# Patient Record
Sex: Male | Born: 1985 | Race: White | Hispanic: No | Marital: Married | Smoking: Never smoker
Health system: Southern US, Community
[De-identification: ages and names within clinical notes are randomized; demographics above are authoritative.]

## PROBLEM LIST (undated history)

## (undated) DIAGNOSIS — K760 Fatty (change of) liver, not elsewhere classified: Secondary | ICD-10-CM

## (undated) DIAGNOSIS — Z87442 Personal history of urinary calculi: Secondary | ICD-10-CM

## (undated) DIAGNOSIS — M1990 Unspecified osteoarthritis, unspecified site: Secondary | ICD-10-CM

## (undated) DIAGNOSIS — F419 Anxiety disorder, unspecified: Secondary | ICD-10-CM

## (undated) DIAGNOSIS — K219 Gastro-esophageal reflux disease without esophagitis: Secondary | ICD-10-CM

## (undated) DIAGNOSIS — G4730 Sleep apnea, unspecified: Secondary | ICD-10-CM

## (undated) DIAGNOSIS — Z8739 Personal history of other diseases of the musculoskeletal system and connective tissue: Secondary | ICD-10-CM

## (undated) HISTORY — PX: SHOULDER SURGERY: SHX246

## (undated) HISTORY — PX: OTHER SURGICAL HISTORY: SHX169

## (undated) HISTORY — DX: Anxiety disorder, unspecified: F41.9

## (undated) HISTORY — DX: Gastro-esophageal reflux disease without esophagitis: K21.9

## (undated) HISTORY — PX: LEG SURGERY: SHX1003

---
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
## 2006-03-13 ENCOUNTER — Encounter: Admission: RE | Admit: 2006-03-13 | Discharge: 2006-03-15 | Payer: Self-pay | Admitting: Orthopedic Surgery

## 2010-06-17 ENCOUNTER — Encounter: Admission: RE | Admit: 2010-06-17 | Discharge: 2010-06-17 | Payer: Self-pay | Admitting: Family Medicine

## 2012-01-18 IMAGING — US US ABDOMEN COMPLETE
1 series · 14 of 25 positions shown · non-contrast
Comparison: None.

CLINICAL DATA: Elevated liver function tests, vomiting

COMPLETE ABDOMINAL ULTRASOUND

[Series 1: us abdomen complete · 0.35mm/px · 14 of 79 slices shown]
[im 1/79]
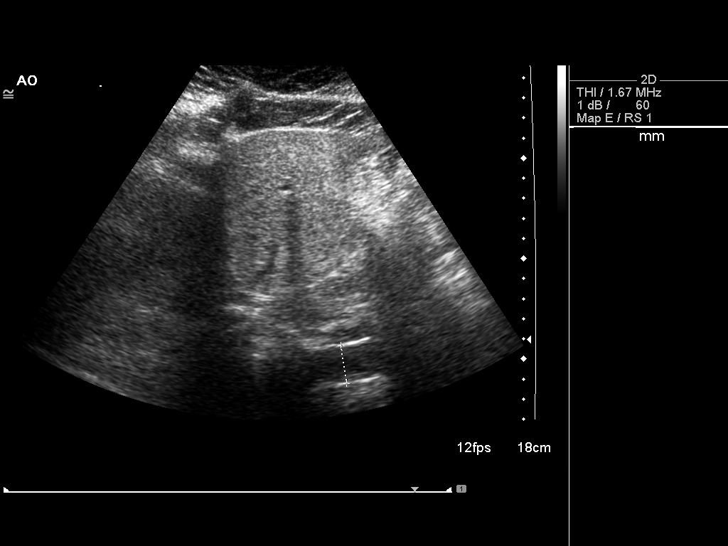
[im 7/79]
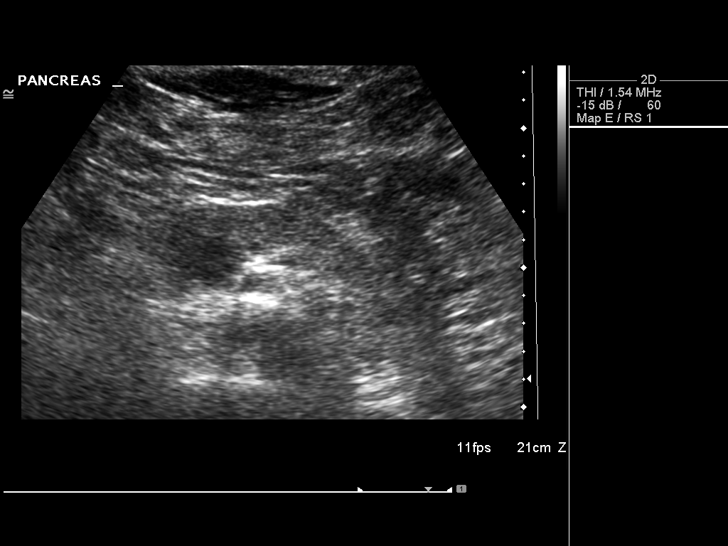
[im 14/79]
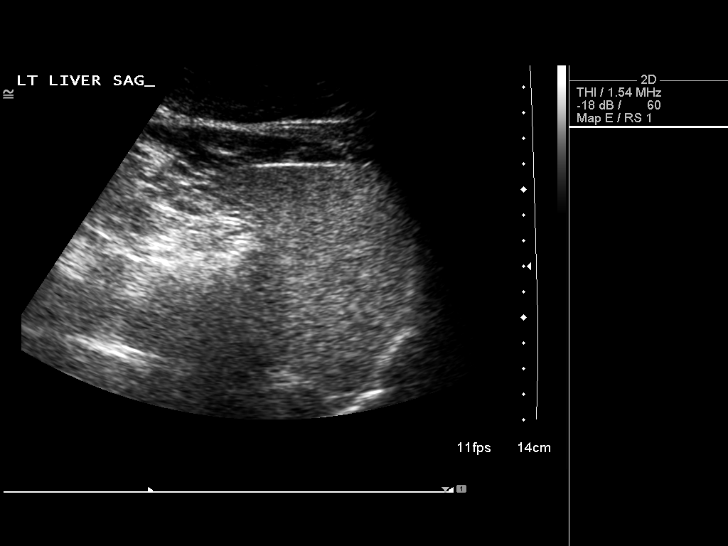
[im 20/79]
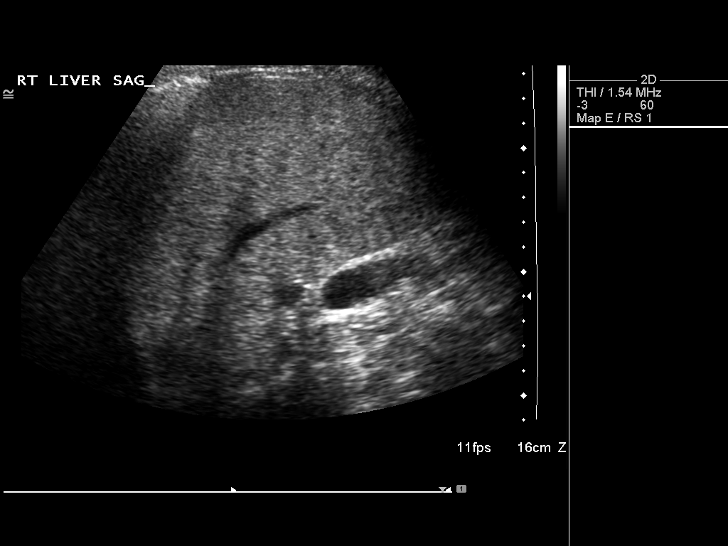
[im 27/79]
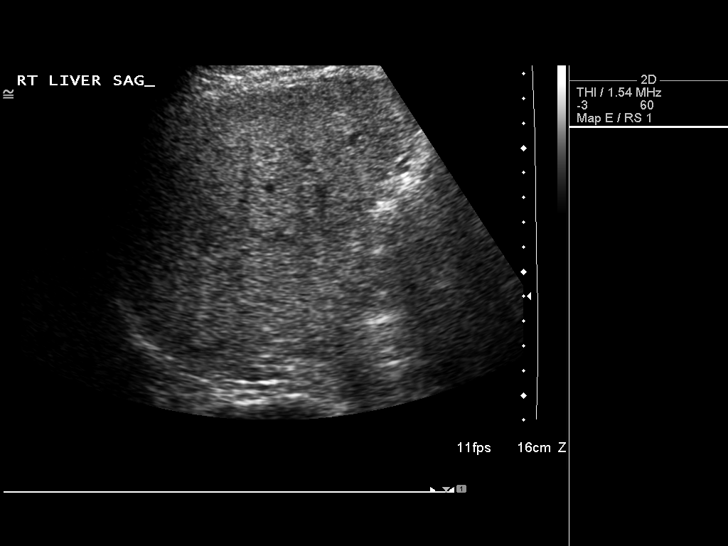
[im 30/79]
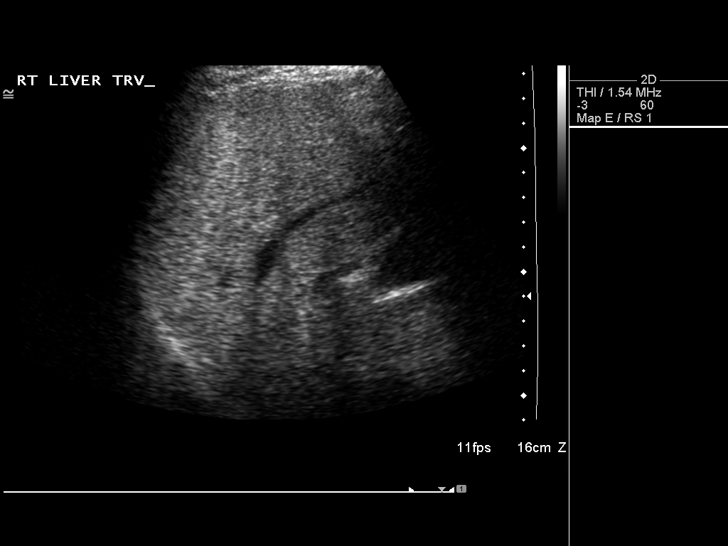
[im 36/79]
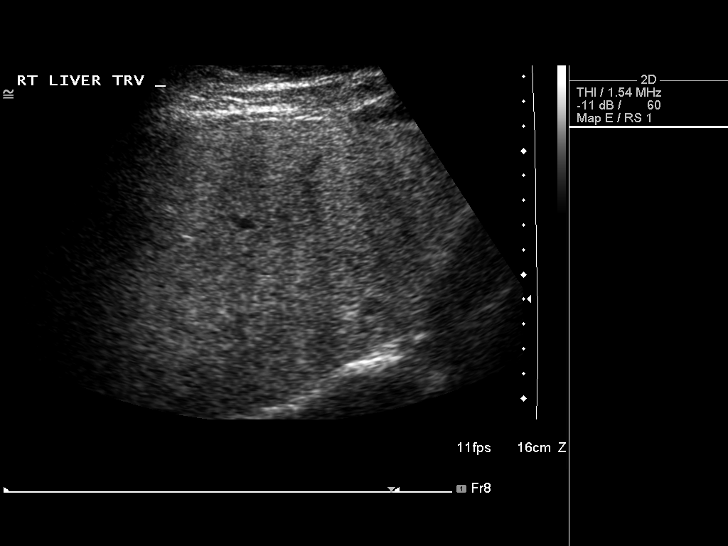
[im 43/79]
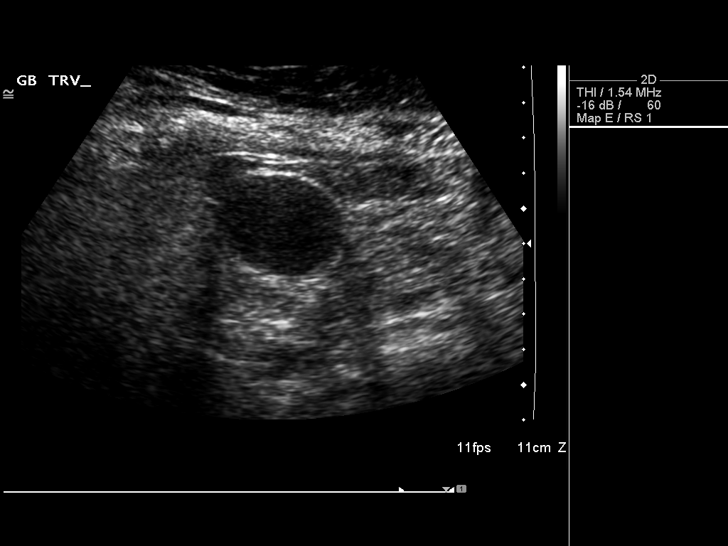
[im 49/79]
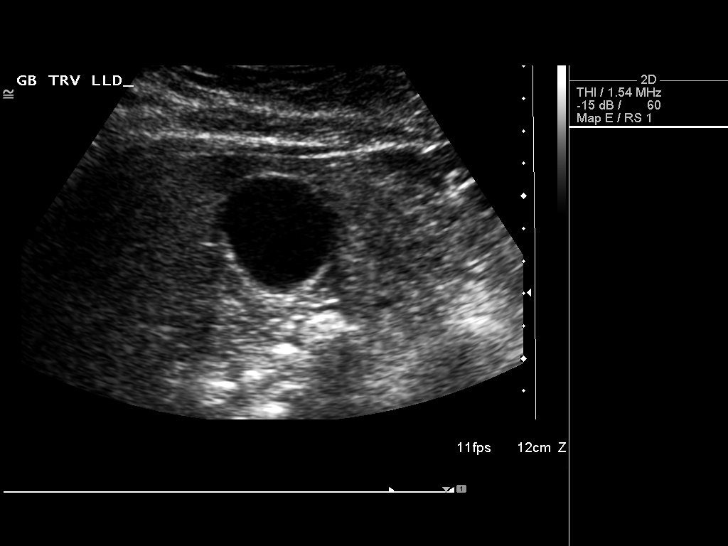
[im 53/79]
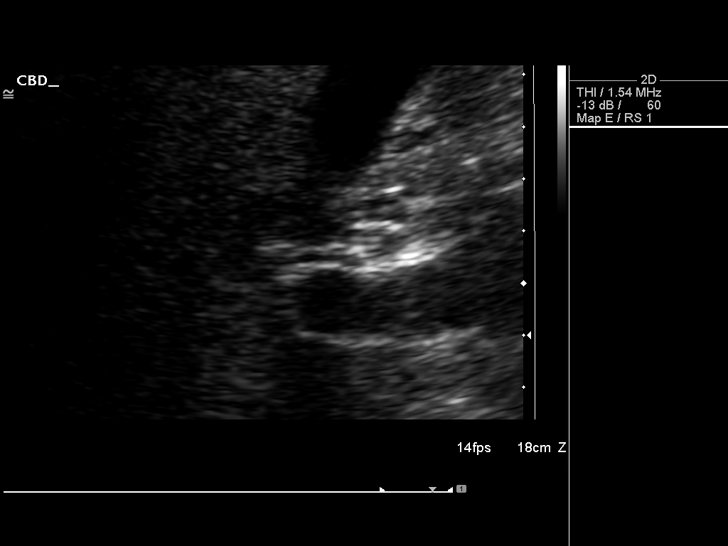
[im 59/79]
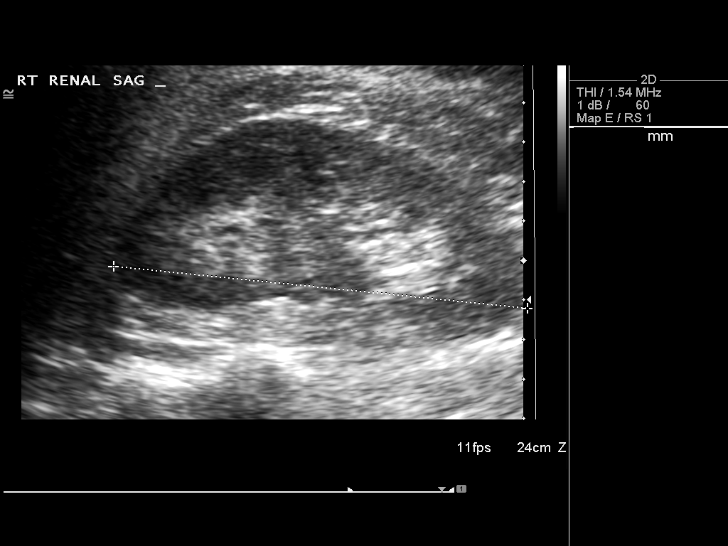
[im 66/79]
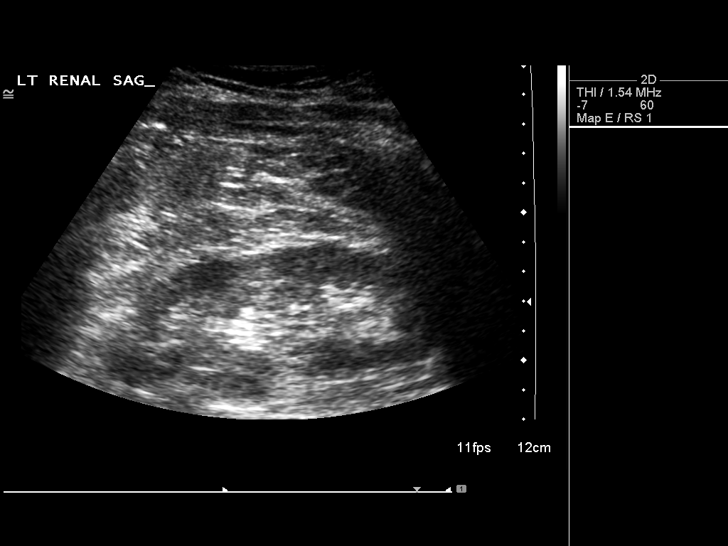
[im 72/79]
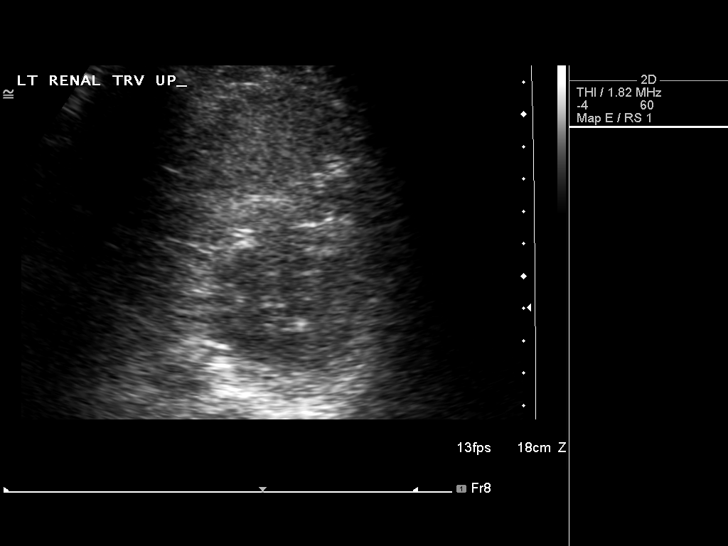
[im 79/79]
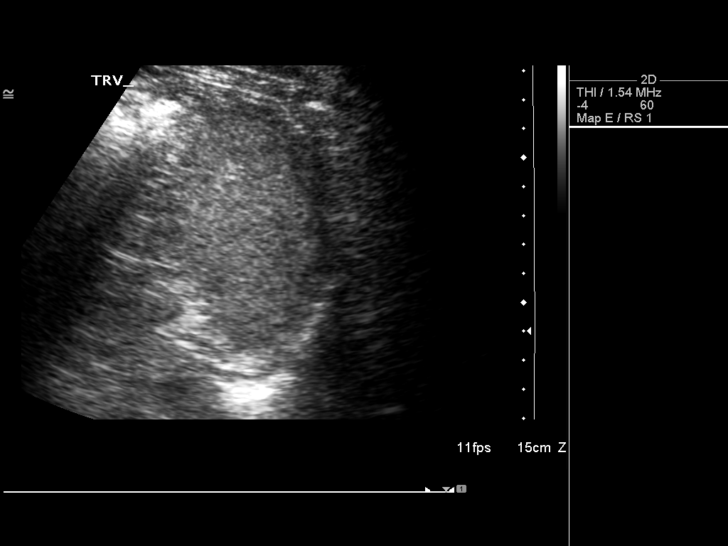

[14 of 25 positions shown; findings below may reference images not displayed]

FINDINGS: Gallbladder:  The gallbladder is visualized and no gallstones are
noted.

Common bile duct:  The common bile duct is normal measuring 2.7 mm
in diameter.

Liver:  The liver is echogenic consistent with fatty infiltration.
No focal abnormality is seen.

IVC:  Appears normal.

Pancreas:  No focal abnormality seen.

Spleen:  The spleen is normal measuring 5.7 cm sagittally.

Right Kidney:  No hydronephrosis is seen.  The right kidney
measures 10.6 cm sagittally.

Left Kidney:  No hydronephrosis.  The left kidney measures 11.6 cm.

Abdominal aorta:  The abdominal aorta is normal in caliber.
IMPRESSION: 1.  No gallstones.  No ductal dilatation.
2.  Fatty infiltration of the liver.

## 2013-01-14 ENCOUNTER — Telehealth: Payer: Self-pay | Admitting: Physician Assistant

## 2013-01-14 NOTE — Telephone Encounter (Signed)
Patient doesn't feel that this is an urgent issue.  Appt scheduled for 01/22/13.  He will let us know if he needs to be seen sooner.

## 2013-01-22 ENCOUNTER — Ambulatory Visit (INDEPENDENT_AMBULATORY_CARE_PROVIDER_SITE_OTHER): Payer: Managed Care, Other (non HMO) | Admitting: General Practice

## 2013-01-22 ENCOUNTER — Encounter: Payer: Self-pay | Admitting: General Practice

## 2013-01-22 VITALS — BP 135/95 | HR 72 | Temp 99.2°F | Ht 70.0 in | Wt 231.5 lb

## 2013-01-22 DIAGNOSIS — F429 Obsessive-compulsive disorder, unspecified: Secondary | ICD-10-CM

## 2013-01-22 MED ORDER — SERTRALINE HCL 50 MG PO TABS: 50.0000 mg | ORAL_TABLET | Freq: Every day | ORAL | Status: DC

## 2013-01-22 NOTE — Patient Instructions (Addendum)
Anxiety and Panic Attacks Your caregiver has informed you that you are having an anxiety or panic attack. There may be many forms of this. Most of the time these attacks come suddenly and without warning. They come at any time of day, including periods of sleep, and at any time of life. They may be strong and unexplained. Although panic attacks are very scary, they are physically harmless. Sometimes the cause of your anxiety is not known. Anxiety is a protective mechanism of the body in its fight or flight mechanism. Most of these perceived danger situations are actually nonphysical situations (such as anxiety over losing a job). CAUSES  The causes of an anxiety or panic attack are many. Panic attacks may occur in otherwise healthy people given a certain set of circumstances. There may be a genetic cause for panic attacks. Some medications may also have anxiety as a side effect. SYMPTOMS  Some of the most common feelings are:  Intense terror.  Dizziness, feeling faint.  Hot and cold flashes.  Fear of going crazy.  Feelings that nothing is real.  Sweating.  Shaking.  Chest pain or a fast heartbeat (palpitations).  Smothering, choking sensations.  Feelings of impending doom and that death is near.  Tingling of extremities, this may be from over-breathing.  Altered reality (derealization).  Being detached from yourself (depersonalization). Several symptoms can be present to make up anxiety or panic attacks. DIAGNOSIS  The evaluation by your caregiver will depend on the type of symptoms you are experiencing. The diagnosis of anxiety or panic attack is made when no physical illness can be determined to be a cause of the symptoms. TREATMENT  Treatment to prevent anxiety and panic attacks may include:  Avoidance of circumstances that cause anxiety.  Reassurance and relaxation.  Regular exercise.  Relaxation therapies, such as yoga.  Psychotherapy with a psychiatrist or  therapist.  Avoidance of caffeine, alcohol and illegal drugs.  Prescribed medication. SEEK IMMEDIATE MEDICAL CARE IF:   You experience panic attack symptoms that are different than your usual symptoms.  You have any worsening or concerning symptoms. Document Released: 09/12/2005 Document Revised: 12/05/2011 Document Reviewed: 01/14/2010 [facility] Patient Information 2013 [place], [place]. Obsessive-Compulsive Disorder Obsessive-compulsive behavior is an anxiety disorder. The patient is constantly troubled by ideas that stay in the mind that cannot be ignored (obsessions). These troubling and sometimes bizarre thoughts compel one to behave in an unreasonable way. The patient will carry out repetitive, ritualistic acts (compulsions) to reduce anxiety. CAUSES The cause of obsessive-compulsive disorder (OCD) is not known. Scientists do know it runs in families. Men begin experiencing it in the teenage years. Women usually begin getting problems (symptoms) in their early 20's. Some studies have shown that the functioning of parts of the brain are different in those with OCD. The disorder may be closely associated with depression. SYMPTOMS  Persons with OCD recognize that their obsessions or compulsions prevent them from living a normal life. They commonly describe their behavior as foolish or pointless. But they cannot change it. Persons with OCD usually feel that their thoughts or obsessions are strange. They do not understand why they are having them. Sometimes the thoughts have to do with a fear that something terrible will happen or that they will do something terrible. Persons with OCD may spend hours each day performing senseless compulsive acts. The amount of time spent is less important than the degree of disruption caused in everyday life. EXAMPLES OF TYPICAL RITUALS  Cleaning. Fearing germs, a person  may shower repeatedly throughout the entire day or wash his or her hands until they  bleed.  Repeating. To dispel anxiety, a person may repeat a name or phrase many times.  Completing. A person may perform a series of complicated steps in an exact order or repeat them until they are done perfectly.  Checking. A person may check things over and over to make sure a task has been completed. For example, repeatedly checking to see if the door is locked or the toaster is unplugged.  Hoarding. A person may constantly collect useless items that he or she repeatedly counts and stacks.  People with OCD may have emotional symptoms associated with depression. This may include guilt, low self-esteem, anxiety, and extreme fatigue. Many of these emotional problems are a result of the frustration brought on by an obsessive-compulsive problem.  Obsessive-compulsive symptoms will often create problems in relationships.  In extreme cases, people with OCD become totally disabled, have no friends, and are unable to leave home surroundings. They spend the day performing rituals or having obsessive thoughts. DIAGNOSIS  There is no laboratory test for OCD. It is diagnosed by your caregiver talking with you and someone close to you about your symptoms. Your caregiver will ask very specific questions about the type of obsessions or compulsions you have.   Your caregiver may diagnose obsessive-compulsive disorder if your obsessions or compulsions:  Cause you distress.  Take more than an hour of your time per day.  Interfere with your normal routine, occupation, social activities, or relationships with others. YOUR CAREGIVER MAY ASK YOU SUCH QUESTIONS AS:  Do you have troubling thoughts that you cannot dispel regardless of how hard you try?  Do you keep things extremely clean and wash your hands more than other people you know?  Do you check things over and over, even though you know that the oven has been turned off or that the front door is locked? TREATMENT  A combination of antidepressant  or anti-anxiety drugs and behavior therapy has been most helpful in treating the disorder. Clomipramine (Anafranil) is often used in the treatment of OCD. Some drugs like Zoloft and Luvox have been used with positive results. In very rare cases, neurosurgery is performed. OCD is not obsession about life's normal worries. Your caregiver will have to make sure that a medication or drug is not adding to your symptoms. Phobias and longstanding (chronic) depression can also occur along with OCD.  HOW LONG WILL THE EFFECTS OF OBSESSIVE-COMPULSIVE DISORDER LAST? Obsessive-compulsive disorder usually appears in the late teens and early twenties. The disorder may last a lifetime without treatment. It may become less severe from time to time, but never quite goes away. You may become free of your symptoms for years before having a relapse. Developments in behavior therapy and new medications are helping many people with OCD live productive lives. HOME CARE INSTRUCTIONS   Include your family in your therapy. You and your family may benefit from reading books, studying OCD, and attending support groups.  Take your medication as ordered by your caregiver.  Do not miss your behavioral therapy sessions.  Know that you are not alone. There are millions of people affected by OCD. There are national organizations devoted to helping people with this disorder. SEEK IMMEDIATE MEDICAL CARE IF:  You feel that any of your ideas or actions are slipping out of your control. Document Released: 09/06/2001 Document Revised: 12/05/2011 Document Reviewed: 05/20/2008 [facility] [place] [facility] Patient Information 2013 [place], [place].

## 2013-01-22 NOTE — Progress Notes (Signed)
  Subjective:    Patient ID: Dean Ferguson, male    DOB: 07-30-1986, 27 y.o.   MRN: [id number]  Anxiety Presents for initial visit. Onset was 1 to 5 years ago (about one year). The problem has been gradually worsening. Symptoms include compulsions, excessive worry, insomnia, nausea, nervous/anxious behavior, obsessions and restlessness. Patient reports no chest pain, shortness of breath or suicidal ideas. Symptoms occur most days. The severity of symptoms is mild. The quality of sleep is poor. Nighttime awakenings: one to two.   Risk factors include alcohol intake and family history (2-3 shots burboun nightly, brother is bipolar, and father on paxil). There is no history of bipolar disorder, depression or suicide attempts. Treatments tried: exercising more.      Review of Systems  Constitutional: Negative for activity change and appetite change.       Increased activity level  Respiratory: Negative for chest tightness and shortness of breath.   Cardiovascular: Negative for chest pain.  Gastrointestinal: Positive for nausea.  Psychiatric/Behavioral: Negative for suicidal ideas. The patient is nervous/anxious and has insomnia.        Reports frequently check to make sure doors are locked, cord on blinds is safely tucked away. Constant thoughts of possible harm or injuries that may occur or happen with daughter. Also has thoughts that people are talking about him, if he is unable to hear conversation.        Objective:   Physical Exam  Constitutional: He is oriented to person, place, and time. He appears well-developed and well-nourished.  HENT:  Head: Normocephalic.  Cardiovascular: Normal rate, regular rhythm and normal heart sounds.   Pulmonary/Chest: No respiratory distress.  Neurological: He is alert and oriented to person, place, and time. He has normal reflexes. He displays normal reflexes. No cranial nerve deficit. Coordination normal.  Skin: Skin is warm and dry.  Psychiatric: He  has a normal mood and affect. His behavior is normal. Judgment normal.          Assessment & Plan:  Take medications as prescribed Avoidance of circumstances that cause anxiety. Reassurance and relaxation Regular exercise Relaxation therapies, deep breathing Avoidance of caffeine, alcohol and illegal drugs RTO if symptoms worsen or no improvement Patient verbalized understanding Raymon Mutton, FNP-C

## 2013-03-05 ENCOUNTER — Ambulatory Visit (INDEPENDENT_AMBULATORY_CARE_PROVIDER_SITE_OTHER): Payer: Managed Care, Other (non HMO) | Admitting: General Practice

## 2013-03-05 ENCOUNTER — Encounter: Payer: Self-pay | Admitting: General Practice

## 2013-03-05 VITALS — BP 145/97 | HR 83 | Temp 98.4°F | Ht 70.5 in | Wt 222.0 lb

## 2013-03-05 DIAGNOSIS — F429 Obsessive-compulsive disorder, unspecified: Secondary | ICD-10-CM

## 2013-03-05 MED ORDER — SERTRALINE HCL 50 MG PO TABS: 50.0000 mg | ORAL_TABLET | Freq: Every day | ORAL | Status: DC

## 2013-03-06 NOTE — Progress Notes (Signed)
Patient ID: Dean Ferguson, male   DOB: 05/21/1986, 27 y.o.   MRN: [id number]  S: Presents today for follow up of obsessive compulsive disorder. He reports taking zoloft as prescribed and reports it is effective. Reports less thoughts of what may happen and things he can't control.   O: Denies fever, chills, headaches, dizziness, shortness of breath, chest pain, palpitations, difficulty urinating, change in bowel or bladder, thoughts of suicide, harming other. Reports slight decrease in sexual drive  A: Normocephalic, atraumatic, perrla, eom intact, clear bilateral breath sounds throughout, heart rate and rhythm regular, Neuro grossly intact II-XII,   P: 1. Obsessive compulsive disorder - sertraline (ZOLOFT) 50 MG tablet; Take 1 tablet (50 mg total) by mouth daily.  Dispense: 30 tablet; Refill: 3 -RTO if symptoms worsen and in one month for follow up -Patient verbalized understanding -Coralie Keens, FNP-C

## 2013-03-12 ENCOUNTER — Telehealth: Payer: Self-pay | Admitting: *Deleted

## 2013-03-12 ENCOUNTER — Other Ambulatory Visit: Payer: Self-pay | Admitting: General Practice

## 2013-03-12 DIAGNOSIS — F429 Obsessive-compulsive disorder, unspecified: Secondary | ICD-10-CM

## 2013-03-12 MED ORDER — SERTRALINE HCL 100 MG PO TABS: 100.0000 mg | ORAL_TABLET | Freq: Every day | ORAL | Status: DC

## 2013-03-12 NOTE — Telephone Encounter (Signed)
Pt says you told him to take 2 of his Sertraline 50mg  to help him.  He has run out of medication.  Please send in new script for taking twice daily or higher dose to [facility].  Thanks  He can be reached at [phone].

## 2013-03-12 NOTE — Telephone Encounter (Signed)
Please inform that medication dose increased to 100mg , so only take one tablet daily. Script sent to pharmacy. thx

## 2013-03-14 NOTE — Telephone Encounter (Signed)
Patient aware of med

## 2013-05-13 ENCOUNTER — Other Ambulatory Visit: Payer: Managed Care, Other (non HMO) | Admitting: General Practice

## 2013-05-16 ENCOUNTER — Other Ambulatory Visit: Payer: Self-pay | Admitting: General Practice

## 2013-08-05 ENCOUNTER — Ambulatory Visit (INDEPENDENT_AMBULATORY_CARE_PROVIDER_SITE_OTHER): Payer: Managed Care, Other (non HMO) | Admitting: Family Medicine

## 2013-08-05 ENCOUNTER — Encounter: Payer: Self-pay | Admitting: Family Medicine

## 2013-08-05 ENCOUNTER — Ambulatory Visit (INDEPENDENT_AMBULATORY_CARE_PROVIDER_SITE_OTHER): Payer: Managed Care, Other (non HMO)

## 2013-08-05 VITALS — BP 143/93 | HR 68 | Temp 98.1°F | Ht 72.0 in | Wt 242.4 lb

## 2013-08-05 DIAGNOSIS — M549 Dorsalgia, unspecified: Secondary | ICD-10-CM

## 2013-08-05 DIAGNOSIS — T1490XA Injury, unspecified, initial encounter: Secondary | ICD-10-CM

## 2013-08-05 MED ORDER — NAPROXEN 500 MG PO TABS: 500.0000 mg | ORAL_TABLET | Freq: Two times a day (BID) | ORAL | Status: DC

## 2013-08-05 MED ORDER — CYCLOBENZAPRINE HCL 10 MG PO TABS: 10.0000 mg | ORAL_TABLET | Freq: Three times a day (TID) | ORAL | Status: DC | PRN

## 2013-08-05 NOTE — Patient Instructions (Signed)
Back Pain, Adult Low back pain is very common. About 1 in 5 people have back pain.The cause of low back pain is rarely dangerous. The pain often gets better over time.About half of people with a sudden onset of back pain feel better in just 2 weeks. About 8 in 10 people feel better by 6 weeks.  CAUSES Some common causes of back pain include:  Strain of the muscles or ligaments supporting the spine.  Wear and tear (degeneration) of the spinal discs.  Arthritis.  Direct injury to the back. DIAGNOSIS Most of the time, the direct cause of low back pain is not known.However, back pain can be treated effectively even when the exact cause of the pain is unknown.Answering your caregiver's questions about your overall health and symptoms is one of the most accurate ways to make sure the cause of your pain is not dangerous. If your caregiver needs more information, he or she may order lab work or imaging tests (X-rays or MRIs).However, even if imaging tests show changes in your back, this usually does not require surgery. HOME CARE INSTRUCTIONS For many people, back pain returns.Since low back pain is rarely dangerous, it is often a condition that people can learn to manageon their own.   Remain active. It is stressful on the back to sit or stand in one place. Do not sit, drive, or stand in one place for more than 30 minutes at a time. Take short walks on level surfaces as soon as pain allows.Try to increase the length of time you walk each day.  Do not stay in bed.Resting more than 1 or 2 days can delay your recovery.  Do not avoid exercise or work.Your body is made to move.It is not dangerous to be active, even though your back may hurt.Your back will likely heal faster if you return to being active before your pain is gone.  Pay attention to your body when you bend and lift. Many people have less discomfortwhen lifting if they bend their knees, keep the load close to their bodies,and  avoid twisting. Often, the most comfortable positions are those that put less stress on your recovering back.  Find a comfortable position to sleep. Use a firm mattress and lie on your side with your knees slightly bent. If you lie on your back, put a pillow under your knees.  Only take over-the-counter or prescription medicines as directed by your caregiver. Over-the-counter medicines to reduce pain and inflammation are often the most helpful.Your caregiver may prescribe muscle relaxant drugs.These medicines help dull your pain so you can more quickly return to your normal activities and healthy exercise.  Put ice on the injured area.  Put ice in a plastic bag.  Place a towel between your skin and the bag.  Leave the ice on for 15-20 minutes, 03-04 times a day for the first 2 to 3 days. After that, ice and heat may be alternated to reduce pain and spasms.  Ask your caregiver about trying back exercises and gentle massage. This may be of some benefit.  Avoid feeling anxious or stressed.Stress increases muscle tension and can worsen back pain.It is important to recognize when you are anxious or stressed and learn ways to manage it.Exercise is a great option. SEEK MEDICAL CARE IF:  You have pain that is not relieved with rest or medicine.  You have pain that does not improve in 1 week.  You have new symptoms.  You are generally not feeling well. SEEK   IMMEDIATE MEDICAL CARE IF:   You have pain that radiates from your back into your legs.  You develop new bowel or bladder control problems.  You have unusual weakness or numbness in your arms or legs.  You develop nausea or vomiting.  You develop abdominal pain.  You feel faint. Document Released: 09/12/2005 Document Revised: 03/13/2012 Document Reviewed: 01/31/2011 [employer] Patient Information 2014 [employer].  

## 2013-08-05 NOTE — Progress Notes (Signed)
  Subjective:    Patient ID: Dean Ferguson, male    DOB: 04/18/1986, 27 y.o.   MRN: [id number]  HPI This 27 y.o. male presents for evaluation of lower back pain.  Patient States he was playing volleyball yesterday and he is having back spasms And back pain.  He states he has hx of back injury when he was in college.   Review of Systems C/o back pain No chest pain, SOB, HA, dizziness, vision change, N/V, diarrhea, constipation, dysuria, urinary urgency or frequency or rash.     Objective:   Physical Exam  Vital signs noted  Well developed well nourished male.  HEENT - Head atraumatic Normocephalic                Eyes - PERRLA, Conjuctiva - clear Sclera- Clear EOMI Respiratory - Lungs CTA bilateral Cardiac - RRR S1 and S2 without murmur GI - Abdomen soft Nontender and bowel sounds active x 4 Extremities - No edema. Neuro - Grossly intact. MS - Decreased ROM LS spine and TTP bilateral lumbar spine.  Lumbar spine xray - No fx Prelimnary reading by Angeline Slim    Assessment & Plan:  Trauma - Plan: DG Lumbar Spine 2-3 Views  Back pain - Plan: naproxen (NAPROSYN) 500 MG tablet, cyclobenzaprine (FLEXERIL) 10 MG tablet  Deatra Canter FNP

## 2015-06-10 ENCOUNTER — Ambulatory Visit (INDEPENDENT_AMBULATORY_CARE_PROVIDER_SITE_OTHER): Payer: Managed Care, Other (non HMO) | Admitting: Physician Assistant

## 2015-06-10 ENCOUNTER — Encounter: Payer: Self-pay | Admitting: Physician Assistant

## 2015-06-10 VITALS — BP 149/112 | HR 88 | Temp 97.4°F | Ht 72.0 in | Wt 231.0 lb

## 2015-06-10 DIAGNOSIS — J309 Allergic rhinitis, unspecified: Secondary | ICD-10-CM

## 2015-06-10 DIAGNOSIS — R030 Elevated blood-pressure reading, without diagnosis of hypertension: Secondary | ICD-10-CM

## 2015-06-10 DIAGNOSIS — J029 Acute pharyngitis, unspecified: Secondary | ICD-10-CM

## 2015-06-10 DIAGNOSIS — IMO0001 Reserved for inherently not codable concepts without codable children: Secondary | ICD-10-CM

## 2015-06-10 LAB — POCT RAPID STREP A (OFFICE): RAPID STREP A SCREEN: NEGATIVE

## 2015-06-10 MED ORDER — MAGIC MOUTHWASH W/LIDOCAINE: 5.0000 mL | Freq: Four times a day (QID) | ORAL | Status: DC | PRN

## 2015-06-10 MED ORDER — FLUTICASONE PROPIONATE 50 MCG/ACT NA SUSP
2.0000 | Freq: Every day | NASAL | Status: DC
Start: 2015-06-10 — End: 2018-05-28

## 2015-06-10 MED ORDER — CETIRIZINE HCL 10 MG PO TABS
10.0000 mg | ORAL_TABLET | Freq: Every day | ORAL | Status: DC
Start: 2015-06-10 — End: 2018-05-28

## 2015-06-10 NOTE — Progress Notes (Signed)
   Subjective:    Patient ID: Dean Ferguson, male    DOB: [birth year], 29 y.o.   MRN: [id number]  HPI 29 y/o male presents with c/o nasal congestion, sore throat x 3 days ago. His children also had similar symptoms with blisters in the back of his throat 2 weeks ago. Has tried Dayquil with no relief.     Review of Systems  Constitutional: Negative.   HENT: Positive for congestion, postnasal drip, rhinorrhea, sneezing and sore throat.   Respiratory: Positive for cough. Negative for wheezing.        Objective:   Physical Exam  Constitutional: He is oriented to person, place, and time. He appears well-developed and well-nourished.  HENT:  Head: Normocephalic.  Right Ear: External ear normal.  Left Ear: External ear normal.  Mouth/Throat: No oropharyngeal exudate.  Posterior pharynx erythema and tonsillar hypertrophy   Eyes: Right eye exhibits no discharge. Left eye exhibits no discharge.  Cardiovascular: Normal rate, regular rhythm and normal heart sounds.  Exam reveals no gallop and no friction rub.   No murmur heard. Hypertensive   Pulmonary/Chest: Effort normal and breath sounds normal. No respiratory distress. He has no wheezes. He has no rales. He exhibits no tenderness.  Musculoskeletal: He exhibits no edema or tenderness.  Neurological: He is alert and oriented to person, place, and time. No cranial nerve deficit. Coordination normal.  Psychiatric: He has a normal mood and affect. His behavior is normal. Judgment and thought content normal.  Nursing note and vitals reviewed.         Assessment & Plan:  1. Sore throat - Tylenol or ibuprofen for pain relief  - POCT rapid strep A - Upper Respiratory Culture, Routine - fluticasone (FLONASE) 50 MCG/ACT nasal spray; Place 2 sprays into both nostrils daily.  Dispense: 16 g; Refill: 6 - cetirizine (ZYRTEC) 10 MG tablet; Take 1 tablet (10 mg total) by mouth daily.  Dispense: 30 tablet; Refill: 11 - magic mouthwash w/lidocaine  SOLN; Take 5 mLs by mouth 4 (four) times daily as needed for mouth pain.  Dispense: 50 mL; Refill: 0  2. Allergic rhinitis, unspecified allergic rhinitis type  - fluticasone (FLONASE) 50 MCG/ACT nasal spray; Place 2 sprays into both nostrils daily.  Dispense: 16 g; Refill: 6 - cetirizine (ZYRTEC) 10 MG tablet; Take 1 tablet (10 mg total) by mouth daily.  Dispense: 30 tablet; Refill: 11  3. Blood pressure elevated - Possibly d/t patient taking decongestant but have advised him to monitor readings at home. Follow up in office if remains elevated.    RTO prn  Alyanah Elliott A. Chauncey Reading PA-C

## 2015-06-12 LAB — UPPER RESPIRATORY CULTURE, ROUTINE

## 2015-06-19 ENCOUNTER — Telehealth: Payer: Self-pay | Admitting: Physician Assistant

## 2015-06-19 NOTE — Telephone Encounter (Signed)
Persistent dry cough and sore throat.  Runny nose improved.  He has been taking a multisymptom cold medication and has used most of the magic mouthwash. He has also checked his blood pressure at home a few times since his office visit and the readings were normal.  Suggested he d/c combination med. Try Delsym, warm salt water gargles and advil for cough and sore throat.  Advised that someone is on call at all times when the office is closed if his symptoms worsen or persist. Patient stated understanding and agreement to plan.

## 2015-06-23 ENCOUNTER — Ambulatory Visit (INDEPENDENT_AMBULATORY_CARE_PROVIDER_SITE_OTHER): Payer: Managed Care, Other (non HMO) | Admitting: Family Medicine

## 2015-06-23 VITALS — BP 136/93 | HR 83 | Temp 98.3°F | Ht 72.0 in | Wt 230.6 lb

## 2015-06-23 DIAGNOSIS — J200 Acute bronchitis due to Mycoplasma pneumoniae: Secondary | ICD-10-CM

## 2015-06-23 MED ORDER — HYDROCODONE-HOMATROPINE 5-1.5 MG/5ML PO SYRP: 5.0000 mL | ORAL_SOLUTION | ORAL | Status: DC | PRN

## 2015-06-23 MED ORDER — LEVOFLOXACIN 500 MG PO TABS: 500.0000 mg | ORAL_TABLET | Freq: Every day | ORAL | Status: DC

## 2015-06-23 MED ORDER — HYDROCODONE-HOMATROPINE 5-1.5 MG/5ML PO SYRP: 5.0000 mL | ORAL_SOLUTION | Freq: Four times a day (QID) | ORAL | Status: DC | PRN

## 2015-06-23 NOTE — Patient Instructions (Signed)
Off work  9/28,29/ 2016 due to illness

## 2015-06-23 NOTE — Progress Notes (Signed)
Subjective:  Patient ID: Dean Ferguson, male    DOB: [birth year]  Age: 29 y.o. MRN: [id number]  CC: Cough   HPI [name] presents for Started with head cold 2 weeks ago. After a week went to  Chest. Cough getting  Deeper. Not dyspneic. Able to go about routine. Sleeping okay. No fever. Gets hot with cough paroxysms. Nonproductive.   History Dean Ferguson has a past medical history of GERD (gastroesophageal reflux disease).   He has past surgical history that includes Leg Surgery (Right) and Shoulder surgery (Right).   His family history includes Hyperlipidemia in his father; Hypertension in his father; Immunodeficiency in his mother.He reports that he has never smoked. He quit smokeless tobacco use about 10 years ago. His smokeless tobacco use included Chew. He reports that he drinks alcohol. He reports that he does not use illicit drugs.  Outpatient Prescriptions Prior to Visit  Medication Sig Dispense Refill  . cetirizine (ZYRTEC) 10 MG tablet Take 1 tablet (10 mg total) by mouth daily. 30 tablet 11  . fluticasone (FLONASE) 50 MCG/ACT nasal spray Place 2 sprays into both nostrils daily. 16 g 6  . magic mouthwash w/lidocaine SOLN Take 5 mLs by mouth 4 (four) times daily as needed for mouth pain. (Patient not taking: Reported on 06/23/2015) 50 mL 0   No facility-administered medications prior to visit.    ROS Review of Systems  Constitutional: Negative for fever, chills, activity change and appetite change.  HENT: Positive for congestion and postnasal drip. Negative for ear discharge, ear pain, hearing loss, nosebleeds, rhinorrhea, sinus pressure, sneezing and trouble swallowing.   Respiratory: Positive for cough. Negative for chest tightness and shortness of breath.   Cardiovascular: Negative for chest pain and palpitations.  Skin: Negative for rash.    Objective:  BP 136/93 mmHg  Pulse 83  Temp(Src) 98.3 F (36.8 C) (Oral)  Ht 6' (1.829 m)  Wt 230 lb 9.6 oz (104.599 kg)  BMI  31.27 kg/m2  BP Readings from Last 3 Encounters:  06/23/15 136/93  06/10/15 149/112  08/05/13 143/93    Wt Readings from Last 3 Encounters:  06/23/15 230 lb 9.6 oz (104.599 kg)  06/10/15 231 lb (104.781 kg)  08/05/13 242 lb 6.4 oz (109.952 kg)     Physical Exam  Constitutional: He is oriented to person, place, and time. He appears well-developed and well-nourished. No distress.  HENT:  Head: Normocephalic and atraumatic.  Right Ear: External ear normal.  Left Ear: External ear normal.  Nose: Nose normal.  Mouth/Throat: Oropharynx is clear and moist.  Eyes: Conjunctivae and EOM are normal. Pupils are equal, round, and reactive to light.  Neck: Normal range of motion. Neck supple. No thyromegaly present.  Cardiovascular: Normal rate, regular rhythm and normal heart sounds.   No murmur heard. Pulmonary/Chest: Effort normal and breath sounds normal. No respiratory distress. He has no wheezes. He has no rales.  Bronchoalveolar changes  Abdominal: Soft. Bowel sounds are normal. He exhibits no distension. There is no tenderness.  Lymphadenopathy:    He has no cervical adenopathy.  Neurological: He is alert and oriented to person, place, and time. He has normal reflexes.  Skin: Skin is warm and dry.  Psychiatric: He has a normal mood and affect. His behavior is normal. Judgment and thought content normal.    No results found for: HGBA1C  No results found for: WBC, HGB, HCT, PLT, GLUCOSE, CHOL, TRIG, HDL, LDLDIRECT, LDLCALC, ALT, AST, NA, K, CL, CREATININE, BUN, CO2, TSH, PSA,  INR, GLUF, HGBA1C, MICROALBUR  US Abdomen Complete  06/17/2010   Clinical Data:  Elevated liver function tests, vomiting   COMPLETE ABDOMINAL ULTRASOUND   Comparison:  None.   Findings:   Gallbladder:  The gallbladder is visualized and no gallstones are noted.   Common bile duct:  The common bile duct is normal measuring 2.7 mm in diameter.   Liver:  The liver is echogenic consistent with fatty infiltration. No  focal abnormality is seen.   IVC:  Appears normal.   Pancreas:  No focal abnormality seen.   Spleen:  The spleen is normal measuring 5.7 cm sagittally.   Right Kidney:  No hydronephrosis is seen.  The right kidney measures 10.6 cm sagittally.   Left Kidney:  No hydronephrosis.  The left kidney measures 11.6 cm.   Abdominal aorta:  The abdominal aorta is normal in caliber.   IMPRESSION:   1.  No gallstones.  No ductal dilatation. 2.  Fatty infiltration of the liver.  Provider: Joesph Fillers   Assessment & Plan:   Quinterious was seen today for cough.  Diagnoses and all orders for this visit:  Acute bronchitis due to Mycoplasma pneumoniae  Other orders -     levofloxacin (LEVAQUIN) 500 MG tablet; Take 1 tablet (500 mg total) by mouth daily. -     Discontinue: HYDROcodone-homatropine (HYCODAN) 5-1.5 MG/5ML syrup; Take 5 mLs by mouth every 6 (six) hours as needed for cough. -     HYDROcodone-homatropine (HYCODAN) 5-1.5 MG/5ML syrup; Take 5 mLs by mouth every 4 (four) hours as needed for cough.   I have discontinued Mr. Pallas's magic mouthwash w/lidocaine. I have also changed his HYDROcodone-homatropine. Additionally, I am having him start on levofloxacin. Lastly, I am having him maintain his fluticasone and cetirizine.  Meds ordered this encounter  Medications  . levofloxacin (LEVAQUIN) 500 MG tablet    Sig: Take 1 tablet (500 mg total) by mouth daily.    Dispense:  7 tablet    Refill:  0  . DISCONTD: HYDROcodone-homatropine (HYCODAN) 5-1.5 MG/5ML syrup    Sig: Take 5 mLs by mouth every 6 (six) hours as needed for cough.    Dispense:  120 mL    Refill:  0  . HYDROcodone-homatropine (HYCODAN) 5-1.5 MG/5ML syrup    Sig: Take 5 mLs by mouth every 4 (four) hours as needed for cough.    Dispense:  180 mL    Refill:  0     Follow-up: No Follow-up on file.  Mechele Claude, M.D.

## 2018-05-24 ENCOUNTER — Encounter: Payer: Self-pay | Admitting: Physician Assistant

## 2018-05-24 ENCOUNTER — Ambulatory Visit (INDEPENDENT_AMBULATORY_CARE_PROVIDER_SITE_OTHER): Payer: 59 | Admitting: Physician Assistant

## 2018-05-24 VITALS — BP 131/81 | HR 86 | Temp 98.8°F | Ht 72.0 in | Wt 265.4 lb

## 2018-05-24 DIAGNOSIS — R0683 Snoring: Secondary | ICD-10-CM | POA: Insufficient documentation

## 2018-05-24 DIAGNOSIS — R5383 Other fatigue: Secondary | ICD-10-CM | POA: Insufficient documentation

## 2018-05-24 DIAGNOSIS — Z0000 Encounter for general adult medical examination without abnormal findings: Secondary | ICD-10-CM | POA: Diagnosis not present

## 2018-05-24 DIAGNOSIS — M109 Gout, unspecified: Secondary | ICD-10-CM

## 2018-05-24 MED ORDER — CITALOPRAM HYDROBROMIDE 10 MG PO TABS: 10.0000 mg | ORAL_TABLET | Freq: Every day | ORAL | 1 refills | Status: DC

## 2018-05-24 MED ORDER — ALLOPURINOL 100 MG PO TABS: 100.0000 mg | ORAL_TABLET | Freq: Three times a day (TID) | ORAL | 5 refills | Status: DC

## 2018-05-25 LAB — CBC WITH DIFFERENTIAL/PLATELET
BASOS ABS: 0 10*3/uL (ref 0.0–0.2)
Basos: 0 %
EOS (ABSOLUTE): 0.1 10*3/uL (ref 0.0–0.4)
Eos: 1 %
HEMATOCRIT: 46.7 % (ref 37.5–51.0)
HEMOGLOBIN: 16.2 g/dL (ref 13.0–17.7)
Immature Grans (Abs): 0 10*3/uL (ref 0.0–0.1)
Immature Granulocytes: 0 %
Lymphocytes Absolute: 1.5 10*3/uL (ref 0.7–3.1)
Lymphs: 17 %
MCH: 34.7 pg — AB (ref 26.6–33.0)
MCHC: 34.7 g/dL (ref 31.5–35.7)
MCV: 100 fL — ABNORMAL HIGH (ref 79–97)
MONOCYTES: 9 %
MONOS ABS: 0.8 10*3/uL (ref 0.1–0.9)
NEUTROS ABS: 6.4 10*3/uL (ref 1.4–7.0)
Neutrophils: 73 %
Platelets: 175 10*3/uL (ref 150–450)
RBC: 4.67 x10E6/uL (ref 4.14–5.80)
RDW: 13.7 % (ref 12.3–15.4)
WBC: 8.8 10*3/uL (ref 3.4–10.8)

## 2018-05-25 LAB — CMP14+EGFR
ALBUMIN: 4.6 g/dL (ref 3.5–5.5)
ALT: 154 IU/L — ABNORMAL HIGH (ref 0–44)
AST: 125 IU/L — AB (ref 0–40)
Albumin/Globulin Ratio: 1.7 (ref 1.2–2.2)
Alkaline Phosphatase: 76 IU/L (ref 39–117)
BILIRUBIN TOTAL: 1.2 mg/dL (ref 0.0–1.2)
BUN / CREAT RATIO: 10 (ref 9–20)
BUN: 10 mg/dL (ref 6–20)
CHLORIDE: 98 mmol/L (ref 96–106)
CO2: 24 mmol/L (ref 20–29)
Calcium: 9.8 mg/dL (ref 8.7–10.2)
Creatinine, Ser: 1.02 mg/dL (ref 0.76–1.27)
GFR calc Af Amer: 111 mL/min/{1.73_m2} (ref >59)
GFR calc non Af Amer: 96 mL/min/{1.73_m2} (ref >59)
GLOBULIN, TOTAL: 2.7 g/dL (ref 1.5–4.5)
GLUCOSE: 89 mg/dL (ref 65–99)
Potassium: 3.9 mmol/L (ref 3.5–5.2)
SODIUM: 139 mmol/L (ref 134–144)
Total Protein: 7.3 g/dL (ref 6.0–8.5)

## 2018-05-25 LAB — LIPID PANEL
CHOLESTEROL TOTAL: 237 mg/dL — AB (ref 100–199)
Chol/HDL Ratio: 5.4 ratio — ABNORMAL HIGH (ref 0.0–5.0)
HDL: 44 mg/dL (ref >39)
LDL Calculated: 173 mg/dL — ABNORMAL HIGH (ref 0–99)
TRIGLYCERIDES: 100 mg/dL (ref 0–149)
VLDL Cholesterol Cal: 20 mg/dL (ref 5–40)

## 2018-05-25 LAB — TSH: TSH: 2.29 u[IU]/mL (ref 0.450–4.500)

## 2018-05-28 DIAGNOSIS — M109 Gout, unspecified: Secondary | ICD-10-CM | POA: Insufficient documentation

## 2018-05-28 NOTE — Progress Notes (Signed)
BP 131/81   Pulse 86   Temp 98.8 F (37.1 C) (Oral)   Ht 6' (1.829 m)   Wt 265 lb 6.4 oz (120.4 kg)   BMI 35.99 kg/m    Subjective:    Patient ID: Dean Ferguson, male    DOB: 1986-03-03, 32 y.o.   MRN: [id number]  HPI: Dean Ferguson is a 32 y.o. male presenting on 05/24/2018 for Annual Exam  This patient comes in for annual well physical examination. All medications are reviewed today. There are no reports of any problems with the medications. All of the medical conditions are reviewed and updated.  Lab work is reviewed and will be ordered as medically necessary. There are no new problems reported with today's visit.  Patient reports doing well overall.   Past Medical History:  Diagnosis Date  . GERD (gastroesophageal reflux disease)    Relevant past medical, surgical, family and social history reviewed and updated as indicated. Interim medical history since our last visit reviewed. Allergies and medications reviewed and updated. DATA REVIEWED: CHART IN EPIC  Family History reviewed for pertinent findings.  Review of Systems  Constitutional: Negative.  Negative for appetite change and fatigue.  HENT: Negative.   Eyes: Negative.  Negative for pain and visual disturbance.  Respiratory: Negative.  Negative for cough, chest tightness, shortness of breath and wheezing.   Cardiovascular: Negative.  Negative for chest pain, palpitations and leg swelling.  Gastrointestinal: Negative.  Negative for abdominal pain, diarrhea, nausea and vomiting.  Endocrine: Negative.   Genitourinary: Negative.   Musculoskeletal: Negative.   Skin: Negative.  Negative for color change and rash.  Neurological: Negative.  Negative for weakness, numbness and headaches.  Psychiatric/Behavioral: Negative.     Allergies as of 05/24/2018      Reactions   Skelaxin [metaxalone] Swelling, Rash      Medication List        Accurate as of 05/24/18 11:59 PM. Always use your most recent med list.          allopurinol 100 MG tablet Commonly known as:  ZYLOPRIM Take 1 tablet (100 mg total) by mouth 3 (three) times daily.   citalopram 10 MG tablet Commonly known as:  CELEXA Take 1 tablet (10 mg total) by mouth daily.          Objective:    BP 131/81   Pulse 86   Temp 98.8 F (37.1 C) (Oral)   Ht 6' (1.829 m)   Wt 265 lb 6.4 oz (120.4 kg)   BMI 35.99 kg/m   Allergies  Allergen Reactions  . Skelaxin [Metaxalone] Swelling and Rash    Wt Readings from Last 3 Encounters:  05/24/18 265 lb 6.4 oz (120.4 kg)  06/23/15 230 lb 9.6 oz (104.6 kg)  06/10/15 231 lb (104.8 kg)    Physical Exam  Constitutional: He appears well-developed and well-nourished.  HENT:  Head: Normocephalic and atraumatic.  Eyes: Pupils are equal, round, and reactive to light. Conjunctivae and EOM are normal.  Neck: Normal range of motion. Neck supple.  Cardiovascular: Normal rate, regular rhythm and normal heart sounds.  Pulmonary/Chest: Effort normal and breath sounds normal.  Abdominal: Soft. Bowel sounds are normal.  Musculoskeletal: Normal range of motion.  Skin: Skin is warm and dry.    Results for orders placed or performed in visit on 05/24/18  CBC with Differential/Platelet  Result Value Ref Range   WBC 8.8 3.4 - 10.8 x10E3/uL   RBC 4.67 4.14 - 5.80 x10E6/uL  Hemoglobin 16.2 13.0 - 17.7 g/dL   Hematocrit 46.7 37.5 - 51.0 %   MCV 100 (H) 79 - 97 fL   MCH 34.7 (H) 26.6 - 33.0 pg   MCHC 34.7 31.5 - 35.7 g/dL   RDW 13.7 12.3 - 15.4 %   Platelets 175 150 - 450 x10E3/uL   Neutrophils 73 Not Estab. %   Lymphs 17 Not Estab. %   Monocytes 9 Not Estab. %   Eos 1 Not Estab. %   Basos 0 Not Estab. %   Neutrophils Absolute 6.4 1.4 - 7.0 x10E3/uL   Lymphocytes Absolute 1.5 0.7 - 3.1 x10E3/uL   Monocytes Absolute 0.8 0.1 - 0.9 x10E3/uL   EOS (ABSOLUTE) 0.1 0.0 - 0.4 x10E3/uL   Basophils Absolute 0.0 0.0 - 0.2 x10E3/uL   Immature Granulocytes 0 Not Estab. %   Immature Grans (Abs) 0.0 0.0 - 0.1  x10E3/uL  CMP14+EGFR  Result Value Ref Range   Glucose 89 65 - 99 mg/dL   BUN 10 6 - 20 mg/dL   Creatinine, Ser 1.02 0.76 - 1.27 mg/dL   GFR calc non Af Amer 96 >59 mL/min/1.73   GFR calc Af Amer 111 >59 mL/min/1.73   BUN/Creatinine Ratio 10 9 - 20   Sodium 139 134 - 144 mmol/L   Potassium 3.9 3.5 - 5.2 mmol/L   Chloride 98 96 - 106 mmol/L   CO2 24 20 - 29 mmol/L   Calcium 9.8 8.7 - 10.2 mg/dL   Total Protein 7.3 6.0 - 8.5 g/dL   Albumin 4.6 3.5 - 5.5 g/dL   Globulin, Total 2.7 1.5 - 4.5 g/dL   Albumin/Globulin Ratio 1.7 1.2 - 2.2   Bilirubin Total 1.2 0.0 - 1.2 mg/dL   Alkaline Phosphatase 76 39 - 117 IU/L   AST 125 (H) 0 - 40 IU/L   ALT 154 (H) 0 - 44 IU/L  Lipid panel  Result Value Ref Range   Cholesterol, Total 237 (H) 100 - 199 mg/dL   Triglycerides 100 0 - 149 mg/dL   HDL 44 >39 mg/dL   VLDL Cholesterol Cal 20 5 - 40 mg/dL   LDL Calculated 173 (H) 0 - 99 mg/dL   Chol/HDL Ratio 5.4 (H) 0.0 - 5.0 ratio  TSH  Result Value Ref Range   TSH 2.290 0.450 - 4.500 uIU/mL      Assessment & Plan:   1. Well adult exam - CBC with Differential/Platelet - CMP14+EGFR - Lipid panel - TSH  2. Snoring - Ambulatory referral to Sleep Studies  3. Other fatigue  4. Gout, unspecified cause, unspecified chronicity, unspecified site   Continue all other maintenance medications as listed above.  Follow up plan: Return in about 4 weeks (around 06/21/2018) for recheck.  Educational handout given for [place] PA-C [facility] [address]  [place], [place] [address] [phone]   05/28/2018, 5:03 PM

## 2018-06-21 ENCOUNTER — Ambulatory Visit (INDEPENDENT_AMBULATORY_CARE_PROVIDER_SITE_OTHER): Payer: 59 | Admitting: Physician Assistant

## 2018-06-21 VITALS — BP 140/98 | HR 79 | Temp 97.8°F | Ht 72.0 in | Wt 267.0 lb

## 2018-06-21 DIAGNOSIS — F320 Major depressive disorder, single episode, mild: Secondary | ICD-10-CM

## 2018-06-21 DIAGNOSIS — R030 Elevated blood-pressure reading, without diagnosis of hypertension: Secondary | ICD-10-CM | POA: Diagnosis not present

## 2018-06-21 MED ORDER — CITALOPRAM HYDROBROMIDE 10 MG PO TABS: 10.0000 mg | ORAL_TABLET | Freq: Every day | ORAL | 2 refills | Status: DC

## 2018-06-24 ENCOUNTER — Encounter: Payer: Self-pay | Admitting: Physician Assistant

## 2018-06-24 DIAGNOSIS — F320 Major depressive disorder, single episode, mild: Secondary | ICD-10-CM | POA: Insufficient documentation

## 2018-06-24 NOTE — Progress Notes (Signed)
BP (!) 140/98   Pulse 79   Temp 97.8 F (36.6 C) (Oral)   Ht 6' (1.829 m)   Wt 267 lb (121.1 kg)   BMI 36.21 kg/m    Subjective:    Patient ID: Dean Ferguson, male    DOB: 10-Jul-1986, 32 y.o.   MRN: [id number]  HPI: Dean Ferguson is a 32 y.o. male presenting on 06/21/2018 for Medication Management  This patient comes in for periodic recheck on medications and conditions including depression and elevated BP reading.  Depression screen [facility] 2/9 06/21/2018 05/24/2018 06/23/2015  Decreased Interest 0 0 1  Down, Depressed, Hopeless 0 0 0  PHQ - 2 Score 0 0 1   .   All medications are reviewed today. There are no reports of any problems with the medications. All of the medical conditions are reviewed and updated.  Lab work is reviewed and will be ordered as medically necessary. There are no new problems reported with today's visit.   Past Medical History:  Diagnosis Date  . GERD (gastroesophageal reflux disease)    Relevant past medical, surgical, family and social history reviewed and updated as indicated. Interim medical history since our last visit reviewed. Allergies and medications reviewed and updated. DATA REVIEWED: CHART IN EPIC  Family History reviewed for pertinent findings.  Review of Systems  Constitutional: Negative.  Negative for appetite change and fatigue.  HENT: Negative.   Eyes: Negative.  Negative for pain and visual disturbance.  Respiratory: Negative.  Negative for cough, chest tightness, shortness of breath and wheezing.   Cardiovascular: Negative.  Negative for chest pain, palpitations and leg swelling.  Gastrointestinal: Negative.  Negative for abdominal pain, diarrhea, nausea and vomiting.  Endocrine: Negative.   Genitourinary: Negative.   Musculoskeletal: Negative.   Skin: Negative.  Negative for color change and rash.  Neurological: Negative.  Negative for weakness, numbness and headaches.  Psychiatric/Behavioral: Negative.     Allergies as of  06/21/2018      Reactions   Skelaxin [metaxalone] Swelling, Rash      Medication List        Accurate as of 06/21/18 11:59 PM. Always use your most recent med list.          allopurinol 100 MG tablet Commonly known as:  ZYLOPRIM Take 1 tablet (100 mg total) by mouth 3 (three) times daily.   citalopram 10 MG tablet Commonly known as:  CELEXA Take 1 tablet (10 mg total) by mouth daily.          Objective:    BP (!) 140/98   Pulse 79   Temp 97.8 F (36.6 C) (Oral)   Ht 6' (1.829 m)   Wt 267 lb (121.1 kg)   BMI 36.21 kg/m   Allergies  Allergen Reactions  . Skelaxin [Metaxalone] Swelling and Rash    Wt Readings from Last 3 Encounters:  06/21/18 267 lb (121.1 kg)  05/24/18 265 lb 6.4 oz (120.4 kg)  06/23/15 230 lb 9.6 oz (104.6 kg)    Physical Exam  Constitutional: He appears well-developed and well-nourished. No distress.  HENT:  Head: Normocephalic and atraumatic.  Eyes: Pupils are equal, round, and reactive to light. Conjunctivae and EOM are normal.  Cardiovascular: Normal rate, regular rhythm and normal heart sounds.  Pulmonary/Chest: Effort normal and breath sounds normal. No respiratory distress.  Skin: Skin is warm and dry.  Psychiatric: He has a normal mood and affect. His behavior is normal.  Nursing note and vitals reviewed.  Results for orders placed or performed in visit on 05/24/18  CBC with Differential/Platelet  Result Value Ref Range   WBC 8.8 3.4 - 10.8 x10E3/uL   RBC 4.67 4.14 - 5.80 x10E6/uL   Hemoglobin 16.2 13.0 - 17.7 g/dL   Hematocrit 46.7 37.5 - 51.0 %   MCV 100 (H) 79 - 97 fL   MCH 34.7 (H) 26.6 - 33.0 pg   MCHC 34.7 31.5 - 35.7 g/dL   RDW 13.7 12.3 - 15.4 %   Platelets 175 150 - 450 x10E3/uL   Neutrophils 73 Not Estab. %   Lymphs 17 Not Estab. %   Monocytes 9 Not Estab. %   Eos 1 Not Estab. %   Basos 0 Not Estab. %   Neutrophils Absolute 6.4 1.4 - 7.0 x10E3/uL   Lymphocytes Absolute 1.5 0.7 - 3.1 x10E3/uL   Monocytes  Absolute 0.8 0.1 - 0.9 x10E3/uL   EOS (ABSOLUTE) 0.1 0.0 - 0.4 x10E3/uL   Basophils Absolute 0.0 0.0 - 0.2 x10E3/uL   Immature Granulocytes 0 Not Estab. %   Immature Grans (Abs) 0.0 0.0 - 0.1 x10E3/uL  CMP14+EGFR  Result Value Ref Range   Glucose 89 65 - 99 mg/dL   BUN 10 6 - 20 mg/dL   Creatinine, Ser 1.02 0.76 - 1.27 mg/dL   GFR calc non Af Amer 96 >59 mL/min/1.73   GFR calc Af Amer 111 >59 mL/min/1.73   BUN/Creatinine Ratio 10 9 - 20   Sodium 139 134 - 144 mmol/L   Potassium 3.9 3.5 - 5.2 mmol/L   Chloride 98 96 - 106 mmol/L   CO2 24 20 - 29 mmol/L   Calcium 9.8 8.7 - 10.2 mg/dL   Total Protein 7.3 6.0 - 8.5 g/dL   Albumin 4.6 3.5 - 5.5 g/dL   Globulin, Total 2.7 1.5 - 4.5 g/dL   Albumin/Globulin Ratio 1.7 1.2 - 2.2   Bilirubin Total 1.2 0.0 - 1.2 mg/dL   Alkaline Phosphatase 76 39 - 117 IU/L   AST 125 (H) 0 - 40 IU/L   ALT 154 (H) 0 - 44 IU/L  Lipid panel  Result Value Ref Range   Cholesterol, Total 237 (H) 100 - 199 mg/dL   Triglycerides 100 0 - 149 mg/dL   HDL 44 >39 mg/dL   VLDL Cholesterol Cal 20 5 - 40 mg/dL   LDL Calculated 173 (H) 0 - 99 mg/dL   Chol/HDL Ratio 5.4 (H) 0.0 - 5.0 ratio  TSH  Result Value Ref Range   TSH 2.290 0.450 - 4.500 uIU/mL      Assessment & Plan:   1. Depression, major, single episode, mild (HCC) - citalopram (CELEXA) 10 MG tablet; Take 1 tablet (10 mg total) by mouth daily.  Dispense: 30 tablet; Refill: 2  2. Elevated blood pressure reading Work on diet and exercise   Continue all other maintenance medications as listed above.  Follow up plan: Return in about 3 months (around 09/20/2018) for recheck.  Educational handout given for [place] PA-C [facility] [address]  [place], [place] [address] [phone]   06/24/2018, 9:03 PM

## 2018-07-13 ENCOUNTER — Encounter: Payer: Self-pay | Admitting: *Deleted

## 2018-07-23 ENCOUNTER — Encounter: Payer: Self-pay | Admitting: Neurology

## 2018-07-23 ENCOUNTER — Ambulatory Visit (INDEPENDENT_AMBULATORY_CARE_PROVIDER_SITE_OTHER): Payer: 59 | Admitting: Neurology

## 2018-07-23 VITALS — BP 135/91 | HR 96 | Ht 72.0 in | Wt 270.0 lb

## 2018-07-23 DIAGNOSIS — F5104 Psychophysiologic insomnia: Secondary | ICD-10-CM | POA: Diagnosis not present

## 2018-07-23 DIAGNOSIS — G4733 Obstructive sleep apnea (adult) (pediatric): Secondary | ICD-10-CM | POA: Diagnosis not present

## 2018-07-23 MED ORDER — TRAZODONE HCL 50 MG PO TABS: 25.0000 mg | ORAL_TABLET | Freq: Every evening | ORAL | 3 refills | Status: DC | PRN

## 2018-07-23 NOTE — Progress Notes (Signed)
SLEEP MEDICINE CLINIC   Provider:  Melvyn Novas, M.D.   Primary Care Physician:  Remus Loffler, PA-C   Referring Provider: Remus Loffler, PA-C    Chief Complaint  Patient presents with  . New Patient (Initial Visit)    pt alone, rm 10. pt here today PCP refererred. wife stated he snores. pt states he may wake up once or twice during the night but not every night. never had a sleep study. unaware of apnea events, patient complains of fatigue on some days    HPI:  Dean Ferguson is a 32 y.o. male patient , seen here on 07-23-2018  in a referral  from PA. Jones for a sleep evaluation.  Chief complaint according to patient : I have the pleasure of meeting Dean Ferguson today, a 32 year old married gentleman and father of 2 school-aged children.  His wife has been urging him to be evaluated for loud snoring.  He believes that his snoring began while he was working night shifts and also this is now in the remote past, he still has noted a persistent change in his sleeping pattern, and continues to snore.  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  He stopped working night shift in summer 2018.  Sleep habits are as follows: Dinnertime is around 7 PM, the children's bedtime is around 8:30 PM, his own bedtime is around 10.30 to 11 PM. He often will have a night Before he goes to bed and it helps him to rest his mind and and to sleep easily.  Some nights he has difficulties initiating sleep otherwise.  He sleeps prone, having one pillow.  The bed is not adjustable.  The bedroom is cool, quiet and dark.  He shares a bedroom with his wife who has noticed him to snore louder.  She has also noted that he will pause after a while of snoring, and then just exhales. He goes to the bathroom rarely, and if only once. Most nights he wakes up at 3, not sure why. He can go back to sleep.  He rises with alarm at 6.15 AM. He hots  the snooze twice. His wife's alarm goes of at 5.30 AM. She is a Engineer, site. .    Sleep medical history and family sleep history: OSA in father. No ENT procedures, trauma. Nasal rhinitis in season. No  Hx of neck injuries.   Social history:  Married, 2 children, working day time now since 02-2017 , works indoors. Film/video editor. He is an outdoorsy type, likes to [employer] Non smoking, non vaping. ETOH  2 drinks a day or more. Caffeine - one energy drink in AM- no coffee, no tea.  Tried keto diet with success in the past- now that he eats with his family he gained since September 50 pounds.    Review of Systems: Out of a complete 14 system review, the patient complains of only the following symptoms, and all other reviewed systems are negative.  Epworth Sleep score: 4/ 24  , Fatigue severity score 20/ 63   , depression score n/a    Social History   Socioeconomic History  . Marital status: Married    Spouse name: Not on file  . Number of children: Not on file  . Years of education: Not on file  . Highest education level: Not on file  Occupational History  .  Not on file  Social Needs  . Financial resource strain: Not on file  . Food insecurity:    Worry: Not on file    Inability: Not on file  . Transportation needs:    Medical: Not on file    Non-medical: Not on file  Tobacco Use  . Smoking status: Never Smoker  . Smokeless tobacco: Former Neurosurgeon    Types: Chew  Substance and Sexual Activity  . Alcohol use: Yes    Comment: social  . Drug use: No  . Sexual activity: Not on file  Lifestyle  . Physical activity:    Days per week: Not on file    Minutes per session: Not on file  . Stress: Not on file  Relationships  . Social connections:    Talks on phone: Not on file    Gets together: Not on file    Attends religious service: Not on file    Active member of club or organization: Not on file    Attends meetings of clubs or organizations: Not on  file    Relationship status: Not on file  . Intimate partner violence:    Fear of current or ex partner: Not on file    Emotionally abused: Not on file    Physically abused: Not on file    Forced sexual activity: Not on file  Other Topics Concern  . Not on file  Social History Narrative  . Not on file    Family History  Problem Relation Age of Onset  . Immunodeficiency Mother   . Hyperlipidemia Father   . Hypertension Father     Past Medical History:  Diagnosis Date  . Anxiety   . GERD (gastroesophageal reflux disease)     Past Surgical History:  Procedure Laterality Date  . LEG SURGERY Right    FELL THROUGH GLASS  . SHOULDER SURGERY Right    FELL THROUGH GLASS    Current Outpatient Medications  Medication Sig Dispense Refill  . allopurinol (ZYLOPRIM) 100 MG tablet Take 1 tablet (100 mg total) by mouth 3 (three) times daily. (Patient taking differently: Take 100 mg by mouth 3 (three) times daily as needed. ) 40 tablet 5   No current facility-administered medications for this visit.     Allergies as of 07/23/2018 - Review Complete 07/23/2018  Allergen Reaction Noted  . Skelaxin [metaxalone] Swelling and Rash 01/22/2013    Vitals: BP (!) 135/91   Pulse 96   Ht 6' (1.829 m)   Wt 270 lb (122.5 kg)   BMI 36.62 kg/m  Last Weight:  Wt Readings from Last 1 Encounters:  07/23/18 270 lb (122.5 kg)   GNF:AOZH mass index is 36.62 kg/m.     Last Height:   Ht Readings from Last 1 Encounters:  07/23/18 6' (1.829 m)    Physical exam:  General: The patient is awake, alert and appears not in acute distress. The patient is well groomed. Head: Normocephalic, atraumatic. Neck is supple. Mallampati 5  , facial hair. neck circumference:19". Nasal airflow congested ,  Retrognathia is not seen.  He has an underbite.  Cardiovascular:  Regular rate and rhythm , without  murmurs or carotid bruit, and without distended neck veins. Respiratory: Lungs are clear to  auscultation. Skin:  Without evidence of edema, or rash Trunk: BMI is 36.6 . The patient's posture is erect.  Neurologic exam : The patient is awake and alert, oriented to place and time.   Attention span & concentration ability  appears normal.  Speech is fluent,  without dysarthria, dysphonia or aphasia.  Mood and affect are appropriate.  Cranial nerves: Pupils are equal and briskly reactive to light. Extraocular movements  in vertical and horizontal planes intact and without nystagmus. Visual fields by finger perimetry are intact.Hearing to finger rub intact.  Facial sensation intact to fine touch. Facial motor strength is symmetric and tongue and uvula move midline. Shoulder shrug was symmetrical.  Motor exam:   Normal tone, muscle bulk and symmetric strength in all extremities. Sensory:  Fine touch, pinprick and vibration were intact in all extremities. Proprioception tested in the upper extremities was normal. Coordination: Rapid alternating movements in the fingers/hands was normal. Finger-to-nose maneuver without evidence of ataxia, dysmetria or tremor. No changes in penmanship reported.  Gait and station: Patient walks without assistive device and is able unassisted to climb up to the exam table. Strength within normal limits.  Stance is stable and normal.  Deep tendon reflexes: in the upper and lower extremities are symmetric and intact.     Assessment:  After physical and neurologic examination, review of laboratory studies,  Personal review of imaging studies, reports of other /same  Imaging studies, results of polysomnography and / or neurophysiology testing and pre-existing records as far as provided in visit., my assessment is :  1) Mr. Brannock is at a higher than average risk of having sleep apnea aside from the witnessed snoring and apneas.  He does have a body mass index that exceeded 36 and therefore would be placed at the morbidly obese category.  His neck size is 19 inches  which also forms by itself a risk factor, Mallampati is grade 5, he is not presenting with pale mucous lining, he has normal eye and skin color, he does not feel excessively cold or hot at night.  He does not wake up with headaches.  Based on these findings on exam and information given during the interview I think that he would be likely to have obstructive sleep apnea but yet has an uncomplicated course - HTN recently developed, not diabetes.   MCV and MCH are high and may be related to lack of Vit B. TSH was normal. He has very high liver enzymes. I urged him to not use alcohol as a self medication to go to sleep- and this also will provide extra carbohydrates and calories. To help with insomnia, will use trazodone.     The patient was advised of the nature of the diagnosed disorder , the treatment options and the  risks for general health and wellness arising from not treating the condition.   I spent more than 40 minutes of face to face time with the patient.  Greater than 50% of time was spent in counseling and coordination of care. We have discussed the diagnosis and differential and I answered the patient's questions.    Plan:  Treatment plan and additional workup : HST - unattended sleep study. Trazodone 25 mg at 9 PM.      Melvyn Novas, MD 07/23/2018, 9:24 AM  Certified in Neurology by ABPN Certified in Sleep Medicine by [facility] [address] [place], [place] [address]

## 2018-07-23 NOTE — Patient Instructions (Signed)
Please remember to try to maintain good sleep hygiene, which means: Keep a regular sleep and wake schedule, try not to exercise or have a meal within 2 hours of your bedtime, try to keep your bedroom conducive for sleep, that is, cool and dark, without light distractors such as an illuminated alarm clock, and refrain from watching TV right before sleep or in the middle of the night and do not keep the TV or radio on during the night. Also, try not to use or play on electronic devices at bedtime, such as your cell phone, tablet PC or laptop. If you like to read at bedtime on an electronic device, try to dim the background light as much as possible. Do not eat in the middle of the night.   We will request a sleep study.    We will look for Snoring and/ or Sleep Apnea.   For chronic insomnia, you are best followed by a psychiatrist and/or sleep psychologist.   We will call you with the sleep study results and make a follow up appointment if needed.

## 2018-08-20 ENCOUNTER — Ambulatory Visit (INDEPENDENT_AMBULATORY_CARE_PROVIDER_SITE_OTHER): Payer: 59 | Admitting: Neurology

## 2018-08-20 DIAGNOSIS — G4733 Obstructive sleep apnea (adult) (pediatric): Secondary | ICD-10-CM

## 2018-08-20 DIAGNOSIS — F320 Major depressive disorder, single episode, mild: Secondary | ICD-10-CM

## 2018-08-20 DIAGNOSIS — F5104 Psychophysiologic insomnia: Secondary | ICD-10-CM

## 2018-08-28 NOTE — Procedures (Signed)
NAME:  Dean Ferguson                                                                DOB: 1986-07-12 MEDICAL RECORD No:  [id number]                                           DOS:  08/20/2018 REFERRING PHYSICIAN: Prudy FeelerAngel Jones, PA STUDY PERFORMED: Home Sleep Test on [facility]  HISTORY: Dean MountsJacob Ferguson is a 32 y.o. male patient is seen on 07-23-2018 in a referral from PA. Jones for a sleep evaluation.  I have the pleasure of meeting Dean Ferguson today, a 32 year old married gentleman and father of 2 school-aged children.  His wife has been urging him to be evaluated for loud snoring.  He believes that his snoring began while he was working night shifts and also this is now in the remote past, he still has noted a persistent change in his sleeping pattern, and continues to snore.  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 He stopped working night shift over 12 month ago.Epworth Sleepiness score: 4/ 24 points, Fatigue severity score 20/ 63 points. BMI 43 kg/m2.    STUDY RESULTS:  Total Recording Time: 8 h17 min; Valid Sleep Time: 6 h 39 min.  Total Apnea/Hypopnea Index (AHI):  41.6 /h; RDI: 44.9 /h; REM AHI estimated to 24.4 /h- NREM 47.5/h.  Average Oxygen Saturation:   93 %:   Lowest Oxygen Desaturation:  81 %.  Total Time in Oxygen Saturation below 89 %: 5.6 minutes.  Average Heart Rate: 69 bpm (between 44 and 121 bpm). IMPRESSION: Severe NREM Sleep Apnea, Severe snoring and sleep architecture with over 25% REM sleep.  There was no prolonged oxygen desaturation, yet the NREM dominance of the Sleep apnea speaks for central and not obstructive sleep apnea.  PS: The higher NREM AHI may relate to the patients habit of prone sleep, and all REM sleep was recorded in prone sleep.  RECOMMENDATION: attended CPAP titration, ASAP. We need to clarify the higher risk of central apneas and be able to intervene if treatment causes central  apneas to emerge. I ordered a return study, PAP titration.  I certify that I have reviewed the raw data recording prior to the issuance of this report in accordance with the standards of the American Academy of Sleep Medicine (AASM). Melvyn Novasarmen Deane Melick, M.D.    08-28-2018    Medical Director of [facility], accredited by the AASM. Diplomat of the ABPN and ABSM.

## 2018-08-28 NOTE — Addendum Note (Signed)
Addended by: Melvyn NovasHMEIER, Tomasz Steeves on: 08/28/2018 05:11 PM   Modules accepted: Orders

## 2018-08-29 ENCOUNTER — Telehealth: Payer: Self-pay | Admitting: Neurology

## 2018-08-29 NOTE — Telephone Encounter (Signed)
-----   Message from Melvyn Novasarmen Dohmeier, MD sent at 08/28/2018  5:11 PM EST ----- IMPRESSION: Severe NREM Sleep Apnea, Severe snoring and sleep  architecture with over 25% REM sleep. There was no prolonged  oxygen desaturation, yet the NREM dominance of the Sleep apnea  speaks for central and not obstructive sleep apnea.  PS: The higher NREM AHI may relate to the patients habit of prone  sleep, and all REM sleep was recorded in prone sleep.  RECOMMENDATION: attended CPAP titration, ASAP. We need to clarify  the higher risk of central apneas and be able to intervene if  treatment causes central apneas to emerge. I ordered a return study, PAP titration. If insurance denies this study, will need to use autotitration device.

## 2018-08-29 NOTE — Telephone Encounter (Signed)
I called pt. I advised pt that Dr. Dohmeier reviewed their sleep study results and found that severe sleep apnea and recommends that pt be treated with a cpap. Dr. Dohmeier recommends that pt return for a repeat sleep study in order to properly titrate the cpap and ensure a good mask fit. Pt is agreeable to returning for a titration study. I advised pt that our sleep lab will file with pt's insurance and call pt to schedule the sleep study when we hear back from the pt's insurance regarding coverage of this sleep study. Pt verbalized understanding of results. Pt had no questions at this time but was encouraged to call back if questions arise.     

## 2018-09-20 ENCOUNTER — Ambulatory Visit (INDEPENDENT_AMBULATORY_CARE_PROVIDER_SITE_OTHER): Payer: 59 | Admitting: Neurology

## 2018-09-20 DIAGNOSIS — G4733 Obstructive sleep apnea (adult) (pediatric): Secondary | ICD-10-CM | POA: Diagnosis not present

## 2018-09-20 DIAGNOSIS — F5104 Psychophysiologic insomnia: Secondary | ICD-10-CM

## 2018-09-20 DIAGNOSIS — F320 Major depressive disorder, single episode, mild: Secondary | ICD-10-CM

## 2018-09-24 ENCOUNTER — Ambulatory Visit: Payer: 59 | Admitting: Physician Assistant

## 2018-09-24 ENCOUNTER — Telehealth: Payer: Self-pay

## 2018-09-24 NOTE — Telephone Encounter (Signed)
Wants a referral for Rheumatologist

## 2018-09-27 ENCOUNTER — Ambulatory Visit (INDEPENDENT_AMBULATORY_CARE_PROVIDER_SITE_OTHER): Payer: 59 | Admitting: Physician Assistant

## 2018-09-27 ENCOUNTER — Encounter: Payer: Self-pay | Admitting: Physician Assistant

## 2018-09-27 VITALS — BP 145/96 | HR 108 | Ht 72.0 in | Wt 267.6 lb

## 2018-09-27 DIAGNOSIS — H5789 Other specified disorders of eye and adnexa: Secondary | ICD-10-CM

## 2018-09-27 DIAGNOSIS — F320 Major depressive disorder, single episode, mild: Secondary | ICD-10-CM | POA: Diagnosis not present

## 2018-09-27 DIAGNOSIS — I10 Essential (primary) hypertension: Secondary | ICD-10-CM

## 2018-09-27 DIAGNOSIS — R899 Unspecified abnormal finding in specimens from other organs, systems and tissues: Secondary | ICD-10-CM | POA: Diagnosis not present

## 2018-09-27 MED ORDER — HYDROXYZINE HCL 25 MG PO TABS: 25.0000 mg | ORAL_TABLET | Freq: Three times a day (TID) | ORAL | 2 refills | Status: DC | PRN

## 2018-09-27 MED ORDER — LISINOPRIL 5 MG PO TABS: 5.0000 mg | ORAL_TABLET | Freq: Every day | ORAL | 0 refills | Status: DC

## 2018-09-27 NOTE — Patient Instructions (Signed)
DASH Eating Plan  DASH stands for "Dietary Approaches to Stop Hypertension." The DASH eating plan is a healthy eating plan that has been shown to reduce high blood pressure (hypertension). It may also reduce your risk for type 2 diabetes, heart disease, and stroke. The DASH eating plan may also help with weight loss.  What are tips for following this plan?    General guidelines   Avoid eating more than 2,300 mg (milligrams) of salt (sodium) a day. If you have hypertension, you may need to reduce your sodium intake to 1,500 mg a day.   Limit alcohol intake to no more than 1 drink a day for nonpregnant women and 2 drinks a day for men. One drink equals 12 oz of beer, 5 oz of wine, or 1 oz of hard liquor.   Work with your health care provider to maintain a healthy body weight or to lose weight. Ask what an ideal weight is for you.   Get at least 30 minutes of exercise that causes your heart to beat faster (aerobic exercise) most days of the week. Activities may include walking, swimming, or biking.   Work with your health care provider or diet and nutrition specialist (dietitian) to adjust your eating plan to your individual calorie needs.  Reading food labels     Check food labels for the amount of sodium per serving. Choose foods with less than 5 percent of the Daily Value of sodium. Generally, foods with less than 300 mg of sodium per serving fit into this eating plan.   To find whole grains, look for the word "whole" as the first word in the ingredient list.  Shopping   Buy products labeled as "low-sodium" or "no salt added."   Buy fresh foods. Avoid canned foods and premade or frozen meals.  Cooking   Avoid adding salt when cooking. Use salt-free seasonings or herbs instead of table salt or sea salt. Check with your health care provider or pharmacist before using salt substitutes.   Do not fry foods. Cook foods using healthy methods such as baking, boiling, grilling, and broiling instead.   Cook with  heart-healthy oils, such as olive, canola, soybean, or sunflower oil.  Meal planning   Eat a balanced diet that includes:  ? 5 or more servings of fruits and vegetables each day. At each meal, try to fill half of your plate with fruits and vegetables.  ? Up to 6-8 servings of whole grains each day.  ? Less than 6 oz of lean meat, poultry, or fish each day. A 3-oz serving of meat is about the same size as a deck of cards. One egg equals 1 oz.  ? 2 servings of low-fat dairy each day.  ? A serving of nuts, seeds, or beans 5 times each week.  ? Heart-healthy fats. Healthy fats called Omega-3 fatty acids are found in foods such as flaxseeds and coldwater fish, like sardines, salmon, and mackerel.   Limit how much you eat of the following:  ? Canned or prepackaged foods.  ? Food that is high in trans fat, such as fried foods.  ? Food that is high in saturated fat, such as fatty meat.  ? Sweets, desserts, sugary drinks, and other foods with added sugar.  ? Full-fat dairy products.   Do not salt foods before eating.   Try to eat at least 2 vegetarian meals each week.   Eat more home-cooked food and less restaurant, buffet, and fast food.     When eating at a restaurant, ask that your food be prepared with less salt or no salt, if possible.  What foods are recommended?  The items listed may not be a complete list. Talk with your dietitian about what dietary choices are best for you.  Grains  Whole-grain or whole-wheat bread. Whole-grain or whole-wheat pasta. Brown rice. Oatmeal. Quinoa. Bulgur. Whole-grain and low-sodium cereals. Pita bread. Low-fat, low-sodium crackers. Whole-wheat flour tortillas.  Vegetables  Fresh or frozen vegetables (raw, steamed, roasted, or grilled). Low-sodium or reduced-sodium tomato and vegetable juice. Low-sodium or reduced-sodium tomato sauce and tomato paste. Low-sodium or reduced-sodium canned vegetables.  Fruits  All fresh, dried, or frozen fruit. Canned fruit in natural juice (without  added sugar).  Meat and other protein foods  Skinless chicken or turkey. Ground chicken or turkey. Pork with fat trimmed off. Fish and seafood. Egg whites. Dried beans, peas, or lentils. Unsalted nuts, nut butters, and seeds. Unsalted canned beans. Lean cuts of beef with fat trimmed off. Low-sodium, lean deli meat.  Dairy  Low-fat (1%) or fat-free (skim) milk. Fat-free, low-fat, or reduced-fat cheeses. Nonfat, low-sodium ricotta or cottage cheese. Low-fat or nonfat yogurt. Low-fat, low-sodium cheese.  Fats and oils  Soft margarine without trans fats. Vegetable oil. Low-fat, reduced-fat, or light mayonnaise and salad dressings (reduced-sodium). Canola, safflower, olive, soybean, and sunflower oils. Avocado.  Seasoning and other foods  Herbs. Spices. Seasoning mixes without salt. Unsalted popcorn and pretzels. Fat-free sweets.  What foods are not recommended?  The items listed may not be a complete list. Talk with your dietitian about what dietary choices are best for you.  Grains  Baked goods made with fat, such as croissants, muffins, or some breads. Dry pasta or rice meal packs.  Vegetables  Creamed or fried vegetables. Vegetables in a cheese sauce. Regular canned vegetables (not low-sodium or reduced-sodium). Regular canned tomato sauce and paste (not low-sodium or reduced-sodium). Regular tomato and vegetable juice (not low-sodium or reduced-sodium). Pickles. Olives.  Fruits  Canned fruit in a light or heavy syrup. Fried fruit. Fruit in cream or butter sauce.  Meat and other protein foods  Fatty cuts of meat. Ribs. Fried meat. Bacon. Sausage. Bologna and other processed lunch meats. Salami. Fatback. Hotdogs. Bratwurst. Salted nuts and seeds. Canned beans with added salt. Canned or smoked fish. Whole eggs or egg yolks. Chicken or turkey with skin.  Dairy  Whole or 2% milk, cream, and half-and-half. Whole or full-fat cream cheese. Whole-fat or sweetened yogurt. Full-fat cheese. Nondairy creamers. Whipped toppings.  Processed cheese and cheese spreads.  Fats and oils  Butter. Stick margarine. Lard. Shortening. Ghee. Bacon fat. Tropical oils, such as coconut, palm kernel, or palm oil.  Seasoning and other foods  Salted popcorn and pretzels. Onion salt, garlic salt, seasoned salt, table salt, and sea salt. Worcestershire sauce. Tartar sauce. Barbecue sauce. Teriyaki sauce. Soy sauce, including reduced-sodium. Steak sauce. Canned and packaged gravies. Fish sauce. Oyster sauce. Cocktail sauce. Horseradish that you find on the shelf. Ketchup. Mustard. Meat flavorings and tenderizers. Bouillon cubes. Hot sauce and Tabasco sauce. Premade or packaged marinades. Premade or packaged taco seasonings. Relishes. Regular salad dressings.  Where to find more information:   National Heart, Lung, and Blood Institute: www.nhlbi.nih.gov   American Heart Association: www.heart.org  Summary   The DASH eating plan is a healthy eating plan that has been shown to reduce high blood pressure (hypertension). It may also reduce your risk for type 2 diabetes, heart disease, and stroke.   With the   DASH eating plan, you should limit salt (sodium) intake to 2,300 mg a day. If you have hypertension, you may need to reduce your sodium intake to 1,500 mg a day.   When on the DASH eating plan, aim to eat more fresh fruits and vegetables, whole grains, lean proteins, low-fat dairy, and heart-healthy fats.   Work with your health care provider or diet and nutrition specialist (dietitian) to adjust your eating plan to your individual calorie needs.  This information is not intended to replace advice given to you by your health care provider. Make sure you discuss any questions you have with your health care provider.  Document Released: 09/01/2011 Document Revised: 09/05/2016 Document Reviewed: 09/05/2016  Elsevier Interactive Patient Education  2019 [employer]

## 2018-09-27 NOTE — Progress Notes (Signed)
BP (!) 145/96   Pulse (!) 108   Ht 6' (1.829 m)   Wt 267 lb 9.6 oz (121.4 kg)   BMI 36.29 kg/m    Subjective:    Patient ID: Dean Ferguson, male    DOB: 1986-04-26, 33 y.o.   MRN: [id number]  HPI: Dean Ferguson is a 33 y.o. male presenting on 09/27/2018 for Depression (3 month ) and Referral (to rheumatology)  This patient comes in for recheck on his medications.  He had been taking Celexa but felt that he was too relaxed on this medication.  So he weaned himself off of it.  At this time he is only taking allopurinol and trazodone at times.  His blood pressure readings have been elevated in recent visits with [place] and other offices.  He had difficulty with his diet back in December.  He ended up being seen through ophthalmology.  He had extremely red sensitive.  Further testing found him to have a positive HPA 27, and a history of ankylosing spondylosis in his mother.  He would like to have referral to rheumatology which I think is appropriate.  In addition his blood pressure has been elevated.  It has been several visits.  We are going to start him with medication.   Past Medical History:  Diagnosis Date  . Anxiety   . GERD (gastroesophageal reflux disease)    Relevant past medical, surgical, family and social history reviewed and updated as indicated. Interim medical history since our last visit reviewed. Allergies and medications reviewed and updated. DATA REVIEWED: CHART IN EPIC  Family History reviewed for pertinent findings.  Review of Systems  Constitutional: Negative.  Negative for appetite change and fatigue.  Eyes: Negative for pain and visual disturbance.  Respiratory: Negative.  Negative for cough, chest tightness, shortness of breath and wheezing.   Cardiovascular: Negative.  Negative for chest pain, palpitations and leg swelling.  Gastrointestinal: Negative.  Negative for abdominal pain, diarrhea, nausea and vomiting.  Genitourinary: Negative.   Skin: Negative.   Negative for color change and rash.  Neurological: Negative.  Negative for weakness, numbness and headaches.  Psychiatric/Behavioral: Negative.     Allergies as of 09/27/2018      Reactions   Skelaxin [metaxalone] Swelling, Rash      Medication List       Accurate as of September 27, 2018 11:59 PM. Always use your most recent med list.        allopurinol 100 MG tablet Commonly known as:  ZYLOPRIM Take 1 tablet (100 mg total) by mouth 3 (three) times daily.   hydrOXYzine 25 MG tablet Commonly known as:  ATARAX/VISTARIL Take 1 tablet (25 mg total) by mouth 3 (three) times daily as needed for anxiety.   lisinopril 5 MG tablet Commonly known as:  PRINIVIL,ZESTRIL Take 1 tablet (5 mg total) by mouth daily.   traZODone 50 MG tablet Commonly known as:  DESYREL Take 0.5-1 tablets (25-50 mg total) by mouth at bedtime as needed for sleep.          Objective:    BP (!) 145/96   Pulse (!) 108   Ht 6' (1.829 m)   Wt 267 lb 9.6 oz (121.4 kg)   BMI 36.29 kg/m   Allergies  Allergen Reactions  . Skelaxin [Metaxalone] Swelling and Rash    Wt Readings from Last 3 Encounters:  09/27/18 267 lb 9.6 oz (121.4 kg)  07/23/18 270 lb (122.5 kg)  06/21/18 267 lb (121.1 kg)  Physical Exam Vitals signs and nursing note reviewed.  Constitutional:      General: He is not in acute distress.    Appearance: He is well-developed.  HENT:     Head: Normocephalic and atraumatic.  Eyes:     Conjunctiva/sclera: Conjunctivae normal.     Pupils: Pupils are equal, round, and reactive to light.  Cardiovascular:     Rate and Rhythm: Normal rate and regular rhythm.     Heart sounds: Normal heart sounds.  Pulmonary:     Effort: Pulmonary effort is normal. No respiratory distress.     Breath sounds: Normal breath sounds.  Skin:    General: Skin is warm and dry.  Psychiatric:        Behavior: Behavior normal.     Results for orders placed or performed in visit on 05/24/18  CBC with  Differential/Platelet  Result Value Ref Range   WBC 8.8 3.4 - 10.8 x10E3/uL   RBC 4.67 4.14 - 5.80 x10E6/uL   Hemoglobin 16.2 13.0 - 17.7 g/dL   Hematocrit 46.7 37.5 - 51.0 %   MCV 100 (H) 79 - 97 fL   MCH 34.7 (H) 26.6 - 33.0 pg   MCHC 34.7 31.5 - 35.7 g/dL   RDW 13.7 12.3 - 15.4 %   Platelets 175 150 - 450 x10E3/uL   Neutrophils 73 Not Estab. %   Lymphs 17 Not Estab. %   Monocytes 9 Not Estab. %   Eos 1 Not Estab. %   Basos 0 Not Estab. %   Neutrophils Absolute 6.4 1.4 - 7.0 x10E3/uL   Lymphocytes Absolute 1.5 0.7 - 3.1 x10E3/uL   Monocytes Absolute 0.8 0.1 - 0.9 x10E3/uL   EOS (ABSOLUTE) 0.1 0.0 - 0.4 x10E3/uL   Basophils Absolute 0.0 0.0 - 0.2 x10E3/uL   Immature Granulocytes 0 Not Estab. %   Immature Grans (Abs) 0.0 0.0 - 0.1 x10E3/uL  CMP14+EGFR  Result Value Ref Range   Glucose 89 65 - 99 mg/dL   BUN 10 6 - 20 mg/dL   Creatinine, Ser 1.02 0.76 - 1.27 mg/dL   GFR calc non Af Amer 96 >59 mL/min/1.73   GFR calc Af Amer 111 >59 mL/min/1.73   BUN/Creatinine Ratio 10 9 - 20   Sodium 139 134 - 144 mmol/L   Potassium 3.9 3.5 - 5.2 mmol/L   Chloride 98 96 - 106 mmol/L   CO2 24 20 - 29 mmol/L   Calcium 9.8 8.7 - 10.2 mg/dL   Total Protein 7.3 6.0 - 8.5 g/dL   Albumin 4.6 3.5 - 5.5 g/dL   Globulin, Total 2.7 1.5 - 4.5 g/dL   Albumin/Globulin Ratio 1.7 1.2 - 2.2   Bilirubin Total 1.2 0.0 - 1.2 mg/dL   Alkaline Phosphatase 76 39 - 117 IU/L   AST 125 (H) 0 - 40 IU/L   ALT 154 (H) 0 - 44 IU/L  Lipid panel  Result Value Ref Range   Cholesterol, Total 237 (H) 100 - 199 mg/dL   Triglycerides 100 0 - 149 mg/dL   HDL 44 >39 mg/dL   VLDL Cholesterol Cal 20 5 - 40 mg/dL   LDL Calculated 173 (H) 0 - 99 mg/dL   Chol/HDL Ratio 5.4 (H) 0.0 - 5.0 ratio  TSH  Result Value Ref Range   TSH 2.290 0.450 - 4.500 uIU/mL      Assessment & Plan:   1. Eye inflammation - Ambulatory referral to Rheumatology  2. Abnormal laboratory test - Ambulatory referral to Rheumatology  3.  Depression, major, single episode, mild (HCC) - hydrOXYzine (ATARAX/VISTARIL) 25 MG tablet; Take 1 tablet (25 mg total) by mouth 3 (three) times daily as needed for anxiety.  Dispense: 60 tablet; Refill: 2  4. Essential hypertension - lisinopril (PRINIVIL,ZESTRIL) 5 MG tablet; Take 1 tablet (5 mg total) by mouth daily.  Dispense: 90 tablet; Refill: 0   Continue all other maintenance medications as listed above.  Follow up plan: Return in about 3 months (around 12/27/2018).  Educational handout given for Dale PA-C [facility] [address]  [place], [place] [address] [phone]   10/01/2018, 7:59 AM

## 2018-10-01 DIAGNOSIS — I10 Essential (primary) hypertension: Secondary | ICD-10-CM | POA: Insufficient documentation

## 2018-10-05 ENCOUNTER — Telehealth: Payer: Self-pay | Admitting: Neurology

## 2018-10-05 ENCOUNTER — Encounter: Payer: Self-pay | Admitting: Neurology

## 2018-10-05 DIAGNOSIS — G4733 Obstructive sleep apnea (adult) (pediatric): Secondary | ICD-10-CM | POA: Insufficient documentation

## 2018-10-05 NOTE — Addendum Note (Signed)
Addended by: Melvyn Novas on: 10/05/2018 10:03 AM   Modules accepted: Orders

## 2018-10-05 NOTE — Procedures (Signed)
PATIENT'S NAME:  Dean Ferguson, Maninder DOB:      [birth year]      MR#:    [id number]     DATE OF RECORDING: 09/20/2018 C.G-A REFERRING M.D.:  Prudy FeelerAngel Jones , PA-C Study Performed: CPAP  Titration: This 33 year old married gentleman and father of 2 school-aged children reports that his wife has been urging him to be evaluated for loud snoring. 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 He stopped working night shift over 12 month ago. Epworth Sleepiness score: 4/ 24 points, Fatigue severity score 20/ 63 points. HISTORY:  Patient returning for a CPAP Titration sleep study, after being tested by a HST on watch pat on 08/20/18. Patient had an AHI of 41.6/ h. RDI was 44.9/h, there were 5.6 minutes in 02 desaturation and an oxygen nadir of 89%. Brady-Tachycardia was noted. The patient's weight 270 pounds with a height of 72 (inches), resulting in a BMI of 36.4 kg/m2.The patient's neck circumference measured 19 inches. CURRENT MEDICATIONS: Zyloprim. HTN monitored.    PROCEDURE:  This is a multichannel digital polysomnogram utilizing the SomnoStar 11.2 system.  Electrodes and sensors were applied and monitored per AASM Specifications.   EEG, EOG, Chin and Limb EMG, were sampled at 200 Hz.  ECG, Snore and Nasal Pressure, Thermal Airflow, Respiratory Effort, CPAP Flow and Pressure, Oximetry was sampled at 50 Hz. Digital video and audio were recorded.      CPAP was initiated at 5 cmH20 with heated humidity per AASM split night standards and pressure was advanced to 11 cmH20 because of hypopneas, apneas and desaturations.  At a PAP pressure of 11 cmH20, there was a reduction of the AHI to 0 with improvement of sleep apnea, the nadir rose to 92% and sleep efficiency was 95%. The technician gave the patient a FFM, F and P SIMPLUS in large size.  Lights Out was at 21:31 and Lights On at 04:52. Total recording time (TRT) was 441 minutes, with a total  sleep time (TST) of 405.5 minutes. The patient's sleep latency was 14.5 minutes. REM latency was 193.5 minutes.  The sleep efficiency was 92.1 %.    SLEEP ARCHITECTURE: WASO (Wake after sleep onset) was 20.5 minutes.  There were 1.5 minutes in Stage N1, 268.5 minutes Stage N2, 59.5 minutes Stage N3 and 76 minutes in Stage REM.  The percentage of Stage N1 was .4%, Stage N2 was 66.2%, Stage N3 was 14.7% and Stage R (REM sleep) was 18.7%.  RESPIRATORY ANALYSIS:  There was a total of 21 respiratory events: 0 obstructive apneas, 1 central apnea and 0 mixed apneas with a total of 1 apnea and 20 hypopneas with several respiratory event related arousals (RERAs).     The total APNEA/HYPOPNEA INDEX (AHI) was 3.1 /hour and the total RESPIRATORY DISTURBANCE INDEX was 4.1 /hour  2 events occurred in REM sleep and 19 events in NREM. The REM AHI was 1.6 /hour versus a non-REM AHI of 3.5 /hour.  The patient spent 230 minutes of total sleep time in the supine position and 176 minutes in non-supine. The supine AHI was 5.0, versus a non-supine AHI of 0.7.  OXYGEN SATURATION & C02:  The baseline 02 saturation was 93%, with the lowest being 82%. Time spent below 89% saturation equaled 6 minutes.  PERIODIC LIMB MOVEMENTS:  The patient had a total of 6 Periodic Limb Movements. The Periodic Limb Movement (PLM) index was 0.9 and the PLM  Arousal index was 0.4 /hour.   EKG was in keeping with normal sinus rhythm.  Post-study, the patient indicated that sleep was the same as usual.   The patient was given a FF Mask.  DIAGNOSIS 1. Severe Obstructive Sleep Apnea responded well to CPAP at 10 and 11 cm water pressure under use of a FFM.  2. No treatment emergent Central Sleep Apnea was seen.  3. No clinically relevant Sleep Related Hypoxemia was noted.  4. normal EKG  PLANS/RECOMMENDATIONS: Auto CPAP, 6 through 12 cm water with 1 cm EPR, Fisher Paykel SIMPLUS mask in large size, heated humidity.   1. Maintain lean body  weight and avoid supine sleep.  Please avoid sedatives, hypnotics, and alcoholic beverage consumption within 2 hours before bedtime. 2. CPAP therapy compliance is defined as 4 hours or more of nightly use.   A follow up appointment will be scheduled in the Sleep Clinic at [facility].   Please call [phone] with any questions.      I certify that I have reviewed the entire raw data recording prior to the issuance of this report in accordance with the Standards of Accreditation of the American Academy of Sleep Medicine (AASM)      Melvyn Novas, M.D.   10-04-2018 Diplomat, American Board of Psychiatry and Neurology  Diplomat, American Board of Sleep Medicine Medical Director, [place] [facility]

## 2018-10-05 NOTE — Telephone Encounter (Signed)
-----   Message from Melvyn Novas, MD sent at 10/05/2018 10:03 AM EST ----- DIAGNOSIS 1. Severe Obstructive Sleep Apnea responded well to CPAP at 10  and 11 cm water pressure under use of a FFM.  2. No treatment emergent Central Sleep Apnea was seen.  3. No clinically relevant Sleep Related Hypoxemia was noted.  4. normal EKG  PLANS/RECOMMENDATIONS: Auto CPAP, 6 through 12 cm water with 1 cm  EPR, Fisher Paykel SIMPLUS mask in large size, heated humidity.   1. Maintain lean body weight and avoid supine sleep.  Please avoid sedatives, hypnotics, and alcoholic beverage  consumption within 2 hours before bedtime. 2. CPAP therapy compliance is defined as 4 hours or more of  nightly use.   A follow up appointment will be scheduled in the Sleep Clinic at  [facility].  Please call [phone] with  any questions.

## 2018-10-05 NOTE — Telephone Encounter (Signed)
I called Dean Ferguson. I advised Dean Ferguson that Dr. Vickey Hugerohmeier reviewed their sleep study results and found that Dean Ferguson has sleep apnea that was treated well at a pressure of 11 cm water pressure. Dr. Vickey Hugerohmeier recommends that Dean Ferguson start auto CPAP 6-12 cm water pressure. I reviewed PAP compliance expectations with the Dean Ferguson. Dean Ferguson is agreeable to starting a CPAP. I advised Dean Ferguson that an order will be sent to a DME, [facility], and [facility] will call the Dean Ferguson within about one week after they file with the Dean Ferguson's insurance. [facility] will show the Dean Ferguson how to use the machine, fit for masks, and troubleshoot the CPAP if needed. A follow up appt was made for insurance purposes with Sarita Bottomarolyn Martin,NP on March 16th 2020 at 8:15 am. Dean Ferguson verbalized understanding to arrive 15 minutes early and bring their CPAP. A letter with all of this information in it will be mailed to the Dean Ferguson as a reminder. I verified with the Dean Ferguson that the address we have on file is correct. Dean Ferguson verbalized understanding of results. Dean Ferguson had no questions at this time but was encouraged to call back if questions arise. I have sent the order to [facility] and have received confirmation that they have received the order.

## 2018-12-09 ENCOUNTER — Encounter: Payer: Self-pay | Admitting: Neurology

## 2018-12-10 ENCOUNTER — Ambulatory Visit: Payer: Self-pay | Admitting: Nurse Practitioner

## 2018-12-10 ENCOUNTER — Telehealth: Payer: Self-pay | Admitting: Nurse Practitioner

## 2018-12-10 NOTE — Telephone Encounter (Signed)
Spoke with pt & r/s him with Eber Jones to 3/17 @ 07:45 arrival 15-30 min early.

## 2018-12-10 NOTE — Progress Notes (Signed)
[facility]  PATIENT: Dean Ferguson DOB: Feb 04, 1986   REASON FOR VISIT: Follow-up for newly diagnosed severe obstructive sleep apnea here for initial CPAP HISTORY FROM: Patient    HISTORY OF PRESENT ILLNESS:UPDATE 3/17/2020CM Dean Ferguson, 33 year old male returns for follow-up with newly diagnosed severe obstructive sleep apnea here for initial CPAP compliance.  Patient has been using his machine for a little over a month.  He has a full facemask.  He notices many times he wakes up with his mask.  He also notices a leak in his mask and on further inspection of his mask which he brought today he actually has a tear when the mask itself which could be contributing to his leak.  Compliance data dated 11/10/2018-12/09/2018 shows compliance greater than 4 hours at 10 days for 33%, less than 4 hours for 18 days at 60% for total compliance 28 out of 30 days at 93%.  Average usage 3 hours 41 minutes.  Set pressure 6 to 12 cm EPR level 1 leak 95th percentile 22.5 AHI 3.7 ESS 2.  He returns for reevaluation 10/28/19CDJacob Ferguson today, a 33 year old married gentleman and father of 2 school-aged children.  His wife has been urging him to be evaluated for loud snoring.  He believes that his snoring began while he was working night shifts and also this is now in the remote past, he still has noted a persistent change in his sleeping pattern, and continues to snore.  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  He stopped working night shift in summer 2018.  Sleep habits are as follows: Dinnertime is around 7 PM, the children's bedtime is around 8:30 PM, his own bedtime is around 10.30 to 11 PM. He often will have a night Before he goes to bed and it helps him to rest his mind and and to sleep easily.  Some nights he has difficulties initiating sleep otherwise.  He sleeps prone, having one pillow.  The bed is not  adjustable.  The bedroom is cool, quiet and dark.  He shares a bedroom with his wife who has noticed him to snore louder.  She has also noted that he will pause after a while of snoring, and then just exhales. He goes to the bathroom rarely, and if only once. Most nights he wakes up at 3, not sure why. He can go back to sleep.  He rises with alarm at 6.15 AM. He hots the snooze twice. His wife's alarm goes of at 5.30 AM. She is a Engineer, site[employer]     REVIEW OF SYSTEMS: Full 14 system review of systems performed and notable only for those listed, all others are neg:  Constitutional: neg  Cardiovascular: neg Ear/Nose/Throat: neg  Skin: neg Eyes: neg Respiratory: neg Gastroitestinal: neg  Hematology/Lymphatic: neg  Endocrine: neg Musculoskeletal:neg Allergy/Immunology: neg Neurological: neg Psychiatric: neg Sleep : Newly diagnosed obstructive sleep apnea with initial CPAP   ALLERGIES: Allergies  Allergen Reactions  . Skelaxin [Metaxalone] Swelling and Rash    HOME MEDICATIONS: Outpatient Medications Prior to Visit  Medication Sig Dispense Refill  . allopurinol (ZYLOPRIM) 100 MG tablet Take 1 tablet (100 mg total) by mouth 3 (three) times daily. (Patient taking differently: Take 100 mg by mouth 3 (three) times daily as needed. ) 40 tablet 5  . hydrOXYzine (ATARAX/VISTARIL) 25 MG tablet Take 1 tablet (25 mg total) by mouth 3 (three) times daily  as needed for anxiety. 60 tablet 2  . lisinopril (PRINIVIL,ZESTRIL) 5 MG tablet Take 1 tablet (5 mg total) by mouth daily. 90 tablet 0  . traZODone (DESYREL) 50 MG tablet Take 0.5-1 tablets (25-50 mg total) by mouth at bedtime as needed for sleep. 30 tablet 3   No facility-administered medications prior to visit.     PAST MEDICAL HISTORY: Past Medical History:  Diagnosis Date  . Anxiety   . GERD (gastroesophageal reflux disease)     PAST SURGICAL HISTORY: Past Surgical History:  Procedure Laterality Date  . LEG SURGERY Right     FELL THROUGH GLASS  . SHOULDER SURGERY Right    FELL THROUGH GLASS    FAMILY HISTORY: Family History  Problem Relation Age of Onset  . Immunodeficiency Mother   . Hyperlipidemia Father   . Hypertension Father     SOCIAL HISTORY: Social History   Socioeconomic History  . Marital status: Married    Spouse name: Not on file  . Number of children: Not on file  . Years of education: Not on file  . Highest education level: Not on file  Occupational History  . Not on file  Social Needs  . Financial resource strain: Not on file  . Food insecurity:    Worry: Not on file    Inability: Not on file  . Transportation needs:    Medical: Not on file    Non-medical: Not on file  Tobacco Use  . Smoking status: Never Smoker  . Smokeless tobacco: Former Neurosurgeon    Types: Chew  Substance and Sexual Activity  . Alcohol use: Yes    Comment: social  . Drug use: No  . Sexual activity: Not on file  Lifestyle  . Physical activity:    Days per week: Not on file    Minutes per session: Not on file  . Stress: Not on file  Relationships  . Social connections:    Talks on phone: Not on file    Gets together: Not on file    Attends religious service: Not on file    Active member of club or organization: Not on file    Attends meetings of clubs or organizations: Not on file    Relationship status: Not on file  . Intimate partner violence:    Fear of current or ex partner: Not on file    Emotionally abused: Not on file    Physically abused: Not on file    Forced sexual activity: Not on file  Other Topics Concern  . Not on file  Social History Narrative  . Not on file     PHYSICAL EXAM  Vitals:   12/11/18 0734 12/11/18 0747  BP: (!) 141/101 (!) 138/102  Pulse: 93 95  SpO2:  96%  Weight: 272 lb 3.2 oz (123.5 kg)   Height: 6' (1.829 m)    Body mass index is 36.92 kg/m.  Generalized: Well developed, in no acute distress  Head: normocephalic and atraumatic,. Oropharynx benign  mallopatti5 Neck: Supple, circumference 19 Lungs: Clear Musculoskeletal: No deformity  Skin no rash or edema Neurological examination   Mentation: Alert oriented to time, place, history taking. Attention span and concentration appropriate. Recent and remote memory intact.  Follows all commands speech and language fluent.   Cranial nerve II-XII: Pupils were equal round reactive to light extraocular movements were full, visual field were full on confrontational test. Facial sensation and strength were normal. hearing was intact to finger rubbing bilaterally. Uvula  tongue midline. head turning and shoulder shrug were normal and symmetric.Tongue protrusion into cheek strength was normal. Motor: normal bulk and tone, full strength in the BUE, BLE,  Sensory: normal and symmetric to light touch,   Coordination: finger-nose-finger, heel-to-shin bilaterally, no dysmetria Gait and Station: Rising up from seated position without assistance, normal stance,  moderate stride, good arm swing, smooth turning, able to perform tiptoe, and heel walking without difficulty. Tandem gait is steady  DIAGNOSTIC DATA (LABS, IMAGING, TESTING) - I reviewed patient records, labs, notes, testing and imaging myself where available.  Lab Results  Component Value Date   WBC 8.8 05/24/2018   HGB 16.2 05/24/2018   HCT 46.7 05/24/2018   MCV 100 (H) 05/24/2018   PLT 175 05/24/2018      Component Value Date/Time   NA 139 05/24/2018 1246   K 3.9 05/24/2018 1246   CL 98 05/24/2018 1246   CO2 24 05/24/2018 1246   GLUCOSE 89 05/24/2018 1246   BUN 10 05/24/2018 1246   CREATININE 1.02 05/24/2018 1246   CALCIUM 9.8 05/24/2018 1246   PROT 7.3 05/24/2018 1246   ALBUMIN 4.6 05/24/2018 1246   AST 125 (H) 05/24/2018 1246   ALT 154 (H) 05/24/2018 1246   ALKPHOS 76 05/24/2018 1246   BILITOT 1.2 05/24/2018 1246   GFRNONAA 96 05/24/2018 1246   GFRAA 111 05/24/2018 1246   Lab Results  Component Value Date   CHOL 237 (H)  05/24/2018   HDL 44 05/24/2018   LDLCALC 173 (H) 05/24/2018   TRIG 100 05/24/2018   CHOLHDL 5.4 (H) 05/24/2018    Lab Results  Component Value Date   TSH 2.290 05/24/2018      ASSESSMENT AND PLAN  33 y.o. year old male  has a past medical history of Anxiety and GERD (gastroesophageal reflux disease). here to  follow-up for newly diagnosed severe obstructive sleep apnea here for initial CPAP. Compliance data dated 11/10/2018-12/09/2018 shows compliance greater than 4 hours at 10 days for 33%, less than 4 hours for 18 days at 60% for total compliance 28 out of 30 days at 93%.  Average usage 3 hours 41 minutes.  Set pressure 6 to 12 cm EPR level 1 leak 95th percentile 22.5 AHI 3.7 ESS 2.   PLAN: CPAP compliance greater than 4 hours 33% reviewed data with patient Has significant leak due to tear in his mask needs mask replacement Patient made aware of insurance guidelines in terms of compliance which has to be greater than 70% for 4 hours. Follow-up in 3 to 4 months I spent 20 minutes in total face to face time with the patient more than 50% of which was spent counseling and coordination of care, reviewing sleep test results performed on 08/20/2018 reviewing medications and discussing and reviewing the diagnosis of obstructive sleep apnea and compliance , Nilda Riggs, [facility], [facility], APRN  [facility] [address] [place], [place] [address] [phone]

## 2018-12-10 NOTE — Telephone Encounter (Signed)
Patient arrived for apt Monday morning - needs call back to reschedule [phone]

## 2018-12-11 ENCOUNTER — Ambulatory Visit (INDEPENDENT_AMBULATORY_CARE_PROVIDER_SITE_OTHER): Payer: 59 | Admitting: Nurse Practitioner

## 2018-12-11 ENCOUNTER — Encounter: Payer: Self-pay | Admitting: Nurse Practitioner

## 2018-12-11 ENCOUNTER — Other Ambulatory Visit: Payer: Self-pay

## 2018-12-11 DIAGNOSIS — G4733 Obstructive sleep apnea (adult) (pediatric): Secondary | ICD-10-CM | POA: Insufficient documentation

## 2018-12-11 DIAGNOSIS — Z9989 Dependence on other enabling machines and devices: Secondary | ICD-10-CM

## 2018-12-11 NOTE — Patient Instructions (Signed)
CPAP compliance greater than 4 hours 33% Has significant leak needs mask Follow-up in 3 to 4 months

## 2018-12-11 NOTE — Progress Notes (Signed)
[facility] received new cpap orders.

## 2018-12-26 ENCOUNTER — Other Ambulatory Visit: Payer: Self-pay | Admitting: Physician Assistant

## 2018-12-26 DIAGNOSIS — I10 Essential (primary) hypertension: Secondary | ICD-10-CM

## 2018-12-28 ENCOUNTER — Ambulatory Visit (INDEPENDENT_AMBULATORY_CARE_PROVIDER_SITE_OTHER): Payer: 59 | Admitting: Physician Assistant

## 2018-12-28 ENCOUNTER — Encounter: Payer: Self-pay | Admitting: Physician Assistant

## 2018-12-28 ENCOUNTER — Other Ambulatory Visit: Payer: Self-pay

## 2018-12-28 DIAGNOSIS — G4733 Obstructive sleep apnea (adult) (pediatric): Secondary | ICD-10-CM

## 2018-12-28 DIAGNOSIS — M549 Dorsalgia, unspecified: Secondary | ICD-10-CM

## 2018-12-28 DIAGNOSIS — Z1589 Genetic susceptibility to other disease: Secondary | ICD-10-CM | POA: Insufficient documentation

## 2018-12-28 DIAGNOSIS — F320 Major depressive disorder, single episode, mild: Secondary | ICD-10-CM

## 2018-12-28 DIAGNOSIS — I10 Essential (primary) hypertension: Secondary | ICD-10-CM

## 2018-12-28 DIAGNOSIS — G8929 Other chronic pain: Secondary | ICD-10-CM | POA: Insufficient documentation

## 2018-12-28 DIAGNOSIS — H209 Unspecified iridocyclitis: Secondary | ICD-10-CM | POA: Insufficient documentation

## 2018-12-28 MED ORDER — LISINOPRIL 10 MG PO TABS: 10.0000 mg | ORAL_TABLET | Freq: Every day | ORAL | 5 refills | Status: DC

## 2018-12-28 MED ORDER — HYDROXYZINE HCL 25 MG PO TABS
25.0000 mg | ORAL_TABLET | Freq: Three times a day (TID) | ORAL | 2 refills | Status: DC | PRN
Start: 2018-12-28 — End: 2019-03-18

## 2018-12-28 NOTE — Progress Notes (Signed)
Telephone visit  Subjective: Dean Ferguson and depression PCP: Terald Sleeper, PA-C DPO:[id number] [name] is a 33 y.o. male calls for telephone consult today. Patient provides verbal consent for consult held via phone.  Patient is identified with 2 separate identifiers.  At this time the entire area is on COVID-19 social distancing and stay home orders are in place.  Patient is of higher risk and therefore we are performing this by a virtual method.  Location of provider: home Location of patient: work Others present for call: no  This is a recheck for the patient's blood Ferguson and depression symptoms.  He reports that he is been feeling very good at this time not having any difficulties.  Hypertension He did have elevated blood Ferguson readings when he went to the neurologist.  So we are going to go ahead and increase his lisinopril from 5 mg to 10 mg.  A prescription will be sent in.  He can take 2 of the current tablets that he has.  Anxiety/depression He is tolerating the Atarax very well.  He takes 1 every morning.  And then he will use some as needed throughout the day, but has found that to be very good he is very happy with the way the medication is working.  Sleep apnea Continue with neurology, CPAP was started.  And he is trying hard to use it every day.  Rheumatology evaluation showed positive HLA 27.  He did have a mother with positive AOS.  He was found to have iritis at his ophthalmology visit.  And this related to the HLA- 27.  He will follow-up with rheumatology as needed.  ROS: Per HPI  Allergies  Allergen Reactions   Skelaxin [Metaxalone] Swelling and Rash   Past Medical History:  Diagnosis Date   Anxiety    GERD (gastroesophageal reflux disease)     Current Outpatient Medications:    allopurinol (ZYLOPRIM) 100 MG tablet, Take 1 tablet (100 mg total) by mouth 3 (three) times daily. (Patient taking differently: Take 100 mg by mouth 3 (three) times  daily as needed. ), Disp: 40 tablet, Rfl: 5   hydrOXYzine (ATARAX/VISTARIL) 25 MG tablet, Take 1 tablet (25 mg total) by mouth 3 (three) times daily as needed for anxiety., Disp: 60 tablet, Rfl: 2   lisinopril (PRINIVIL,ZESTRIL) 10 MG tablet, Take 1 tablet (10 mg total) by mouth daily., Disp: 30 tablet, Rfl: 5   traZODone (DESYREL) 50 MG tablet, Take 0.5-1 tablets (25-50 mg total) by mouth at bedtime as needed for sleep., Disp: 30 tablet, Rfl: 3  Assessment/ Plan: 33 y.o. male   1. Essential hypertension - lisinopril (PRINIVIL,ZESTRIL) 10 MG tablet; Take 1 tablet (10 mg total) by mouth daily.  Dispense: 30 tablet; Refill: 5  2. Depression, major, single episode, mild (HCC) - hydrOXYzine (ATARAX/VISTARIL) 25 MG tablet; Take 1 tablet (25 mg total) by mouth 3 (three) times daily as needed for anxiety.  Dispense: 60 tablet; Refill: 2  3. OSA (obstructive sleep apnea) Continue with neurology and CPAP  4. HLA B27 (HLA B27 positive) Continue with rheumatology  5. Iritis of left eye Continue with rheumatology  6. Chronic back pain, unspecified back location, unspecified back pain laterality Continue with rheumatology   Start time: 8:03 AM End time: 8:15 AM  Meds ordered this encounter  Medications   lisinopril (PRINIVIL,ZESTRIL) 10 MG tablet    Sig: Take 1 tablet (10 mg total) by mouth daily.    Dispense:  30 tablet  Refill:  5    Order Specific Question:   Supervising Provider    Answer:   Janora Norlander [[id number]]   hydrOXYzine (ATARAX/VISTARIL) 25 MG tablet    Sig: Take 1 tablet (25 mg total) by mouth 3 (three) times daily as needed for anxiety.    Dispense:  60 tablet    Refill:  2    Order Specific Question:   Supervising Provider    Answer:   Janora Norlander [[id number]]    Particia Nearing PA-C [place] [phone]

## 2019-01-28 ENCOUNTER — Ambulatory Visit (INDEPENDENT_AMBULATORY_CARE_PROVIDER_SITE_OTHER): Payer: 59 | Admitting: Family Medicine

## 2019-01-28 ENCOUNTER — Encounter: Payer: Self-pay | Admitting: Family Medicine

## 2019-01-28 ENCOUNTER — Other Ambulatory Visit: Payer: Self-pay

## 2019-01-28 DIAGNOSIS — M62830 Muscle spasm of back: Secondary | ICD-10-CM

## 2019-01-28 MED ORDER — NAPROXEN 500 MG PO TABS: 500.0000 mg | ORAL_TABLET | Freq: Two times a day (BID) | ORAL | 0 refills | Status: AC

## 2019-01-28 MED ORDER — CYCLOBENZAPRINE HCL 5 MG PO TABS: 5.0000 mg | ORAL_TABLET | Freq: Three times a day (TID) | ORAL | 0 refills | Status: AC | PRN

## 2019-01-28 NOTE — Patient Instructions (Signed)
Symptomatic care: Moist heat Back strengthening and stretching exercises. Topical creams such as Biofreeze or Thermacare.  Medications as prescribed.  Report any new or worsening symptoms.

## 2019-01-28 NOTE — Progress Notes (Signed)
Virtual Visit via telephone Note Due to COVID-19, visit is conducted virtually and was requested by patient.   I connected with Dean Ferguson on 01/28/19 at 0810 by telephone and verified that I am speaking with the correct person using two identifiers. Dean Ferguson is currently located at home and family is currently with them during visit. The provider, Kari Baars, FNP is located in their office at time of visit.  I discussed the limitations, risks, security and privacy concerns of performing an evaluation and management service by telephone and the availability of in person appointments. I also discussed with the patient that there may be a patient responsible charge related to this service. The patient expressed understanding and agreed to proceed.  Subjective:  Patient ID: Dean Ferguson, male    DOB: Dec 26, 1985, 33 y.o.   MRN: [id number]  Chief Complaint:  Back Pain   HPI: Dean Ferguson is a 33 y.o. male presenting on 01/28/2019 for Back Pain   Pt reports onset of back pain Saturday after lifting a heavy object. Has had similar symptoms in the past.   Back Pain  This is a recurrent problem. The current episode started in the past 7 days. The problem occurs constantly. The problem is unchanged. The pain is present in the lumbar spine. The quality of the pain is described as aching (spasm). The pain does not radiate. The pain is at a severity of 7/10. The pain is moderate. The pain is the same all the time. The symptoms are aggravated by standing, bending, twisting and position. Stiffness is present all day. Pertinent negatives include no bladder incontinence, bowel incontinence, chest pain, fever, leg pain, numbness, paresis, paresthesias, perianal numbness, tingling, weakness or weight loss. He has tried analgesics for the symptoms. The treatment provided mild relief.     Relevant past medical, surgical, family, and social history reviewed and updated as indicated.  Allergies and  medications reviewed and updated.   Past Medical History:  Diagnosis Date  . Anxiety   . GERD (gastroesophageal reflux disease)     Past Surgical History:  Procedure Laterality Date  . LEG SURGERY Right    FELL THROUGH GLASS  . SHOULDER SURGERY Right    FELL THROUGH GLASS    Social History   Socioeconomic History  . Marital status: Married    Spouse name: Not on file  . Number of children: Not on file  . Years of education: Not on file  . Highest education level: Not on file  Occupational History  . Not on file  Social Needs  . Financial resource strain: Not on file  . Food insecurity:    Worry: Not on file    Inability: Not on file  . Transportation needs:    Medical: Not on file    Non-medical: Not on file  Tobacco Use  . Smoking status: Never Smoker  . Smokeless tobacco: Former Neurosurgeon    Types: Chew  Substance and Sexual Activity  . Alcohol use: Yes    Comment: social  . Drug use: No  . Sexual activity: Not on file  Lifestyle  . Physical activity:    Days per week: Not on file    Minutes per session: Not on file  . Stress: Not on file  Relationships  . Social connections:    Talks on phone: Not on file    Gets together: Not on file    Attends religious service: Not on file    Active member of club  or organization: Not on file    Attends meetings of clubs or organizations: Not on file    Relationship status: Not on file  . Intimate partner violence:    Fear of current or ex partner: Not on file    Emotionally abused: Not on file    Physically abused: Not on file    Forced sexual activity: Not on file  Other Topics Concern  . Not on file  Social History Narrative  . Not on file    Outpatient Encounter Medications as of 01/28/2019  Medication Sig  . allopurinol (ZYLOPRIM) 100 MG tablet Take 1 tablet (100 mg total) by mouth 3 (three) times daily. (Patient taking differently: Take 100 mg by mouth 3 (three) times daily as needed. )  . cyclobenzaprine  (FLEXERIL) 5 MG tablet Take 1 tablet (5 mg total) by mouth 3 (three) times daily as needed for up to 10 days for muscle spasms.  . hydrOXYzine (ATARAX/VISTARIL) 25 MG tablet Take 1 tablet (25 mg total) by mouth 3 (three) times daily as needed for anxiety.  Marland Kitchen. lisinopril (PRINIVIL,ZESTRIL) 10 MG tablet Take 1 tablet (10 mg total) by mouth daily.  . naproxen (NAPROSYN) 500 MG tablet Take 1 tablet (500 mg total) by mouth 2 (two) times daily with a meal for 14 days.  . traZODone (DESYREL) 50 MG tablet Take 0.5-1 tablets (25-50 mg total) by mouth at bedtime as needed for sleep.   No facility-administered encounter medications on file as of 01/28/2019.     Allergies  Allergen Reactions  . Skelaxin [Metaxalone] Swelling and Rash    Review of Systems  Constitutional: Positive for activity change. Negative for chills, fatigue, fever, unexpected weight change and weight loss.  Respiratory: Negative for cough and shortness of breath.   Cardiovascular: Negative for chest pain, palpitations and leg swelling.  Gastrointestinal: Negative for bowel incontinence.  Genitourinary: Negative for bladder incontinence.  Musculoskeletal: Positive for back pain and myalgias. Negative for gait problem.  Neurological: Negative for tingling, weakness, numbness and paresthesias.  Psychiatric/Behavioral: Negative for confusion.  All other systems reviewed and are negative.        Observations/Objective: No vital signs or physical exam, this was a telephone or virtual health encounter.  Pt alert and oriented, answers all questions appropriately, and able to speak in full sentences.    Assessment and Plan: Dean Ferguson was seen today for back pain.  Diagnoses and all orders for this visit:  Lumbar paraspinal muscle spasm Symptomatic care discussed. Moist heat, topical creams, and back strengthening and stretching exercises. Medications as prescribed. Report any new or worsening symptoms.  -     cyclobenzaprine  (FLEXERIL) 5 MG tablet; Take 1 tablet (5 mg total) by mouth 3 (three) times daily as needed for up to 10 days for muscle spasms. -     naproxen (NAPROSYN) 500 MG tablet; Take 1 tablet (500 mg total) by mouth 2 (two) times daily with a meal for 14 days.     Follow Up Instructions: Return in about 4 weeks (around 02/25/2019), or if symptoms worsen or fail to improve.    I discussed the assessment and treatment plan with the patient. The patient was provided an opportunity to ask questions and all were answered. The patient agreed with the plan and demonstrated an understanding of the instructions.   The patient was advised to call back or seek an in-person evaluation if the symptoms worsen or if the condition fails to improve as anticipated.  The above  assessment and management plan was discussed with the patient. The patient verbalized understanding of and has agreed to the management plan. Patient is aware to call the clinic if symptoms persist or worsen. Patient is aware when to return to the clinic for a follow-up visit. Patient educated on when it is appropriate to go to the emergency department.    I provided 15 minutes of non-face-to-face time during this encounter. The call started at 0810. The call ended at 0825.   Kari Baars, FNP-C [facility] [address] [place], [place] [address] [phone]

## 2019-03-18 ENCOUNTER — Ambulatory Visit (INDEPENDENT_AMBULATORY_CARE_PROVIDER_SITE_OTHER): Payer: 59 | Admitting: Family Medicine

## 2019-03-18 ENCOUNTER — Encounter: Payer: Self-pay | Admitting: Family Medicine

## 2019-03-18 ENCOUNTER — Telehealth: Payer: Self-pay | Admitting: Physician Assistant

## 2019-03-18 ENCOUNTER — Other Ambulatory Visit: Payer: Self-pay

## 2019-03-18 DIAGNOSIS — L237 Allergic contact dermatitis due to plants, except food: Secondary | ICD-10-CM

## 2019-03-18 MED ORDER — HYDROXYZINE HCL 25 MG PO TABS: 25.0000 mg | ORAL_TABLET | Freq: Three times a day (TID) | ORAL | 2 refills | Status: DC | PRN

## 2019-03-18 MED ORDER — PREDNISONE 10 MG (21) PO TBPK: ORAL_TABLET | ORAL | 0 refills | Status: DC

## 2019-03-18 NOTE — Progress Notes (Signed)
Virtual Visit via telephone Note Due to COVID-19, visit is conducted virtually and was requested by patient. This visit type was conducted due to national recommendations for restrictions regarding the COVID-19 Pandemic (e.g. social distancing) in an effort to limit this patient's exposure and mitigate transmission in our community. All issues noted in this document were discussed and addressed.  A physical exam was not performed with this format.   I connected with Dean Ferguson on 03/18/19 at 1035 by telephone and verified that I am speaking with the correct person using two identifiers. Dean Ferguson is currently located at home and no one is currently with them during visit. The provider, Monia Pouch, FNP is located in their office at time of visit.  I discussed the limitations, risks, security and privacy concerns of performing an evaluation and management service by telephone and the availability of in person appointments. I also discussed with the patient that there may be a patient responsible charge related to this service. The patient expressed understanding and agreed to proceed.  Subjective:  Patient ID: Dean Ferguson, male    DOB: 1986/01/09, 33 y.o.   MRN: [id number]  Chief Complaint:  [place]   HPI: Dean Ferguson is a 33 y.o. male presenting on 03/18/2019 for [place]   Pt reports he was clearing some land over the weekend and got into some poison oak. Pt states he developed a vesicular, pruritic rash on his bilateral hands and forearms yesterday. States the rash is draining clear fluid today and is spreading up his arms. No fever, erythema, cough, shortness of breath of facial involvement. He has tried bendaryl without relief of symptoms.     Relevant past medical, surgical, family, and social history reviewed and updated as indicated.  Allergies and medications reviewed and updated.   Past Medical History:  Diagnosis Date  . Anxiety   . GERD (gastroesophageal  reflux disease)     Past Surgical History:  Procedure Laterality Date  . LEG SURGERY Right    FELL THROUGH GLASS  . SHOULDER SURGERY Right    FELL THROUGH GLASS    Social History   Socioeconomic History  . Marital status: Married    Spouse name: Not on file  . Number of children: Not on file  . Years of education: Not on file  . Highest education level: Not on file  Occupational History  . Not on file  Social Needs  . Financial resource strain: Not on file  . Food insecurity    Worry: Not on file    Inability: Not on file  . Transportation needs    Medical: Not on file    Non-medical: Not on file  Tobacco Use  . Smoking status: Never Smoker  . Smokeless tobacco: Former Systems developer    Types: Chew  Substance and Sexual Activity  . Alcohol use: Yes    Comment: social  . Drug use: No  . Sexual activity: Not on file  Lifestyle  . Physical activity    Days per week: Not on file    Minutes per session: Not on file  . Stress: Not on file  Relationships  . Social Herbalist on phone: Not on file    Gets together: Not on file    Attends religious service: Not on file    Active member of club or organization: Not on file    Attends meetings of clubs or organizations: Not on file    Relationship status: Not  on file  . Intimate partner violence    Fear of current or ex partner: Not on file    Emotionally abused: Not on file    Physically abused: Not on file    Forced sexual activity: Not on file  Other Topics Concern  . Not on file  Social History Narrative  . Not on file    Outpatient Encounter Medications as of 03/18/2019  Medication Sig  . allopurinol (ZYLOPRIM) 100 MG tablet Take 1 tablet (100 mg total) by mouth 3 (three) times daily. (Patient taking differently: Take 100 mg by mouth 3 (three) times daily as needed. )  . hydrOXYzine (ATARAX/VISTARIL) 25 MG tablet Take 1 tablet (25 mg total) by mouth 3 (three) times daily as needed for anxiety.  Marland Kitchen. lisinopril  (PRINIVIL,ZESTRIL) 10 MG tablet Take 1 tablet (10 mg total) by mouth daily.  . predniSONE (STERAPRED UNI-PAK 21 TAB) 10 MG (21) TBPK tablet As directed x 6 days  . traZODone (DESYREL) 50 MG tablet Take 0.5-1 tablets (25-50 mg total) by mouth at bedtime as needed for sleep.  . [DISCONTINUED] hydrOXYzine (ATARAX/VISTARIL) 25 MG tablet Take 1 tablet (25 mg total) by mouth 3 (three) times daily as needed for anxiety.   No facility-administered encounter medications on file as of 03/18/2019.     Allergies  Allergen Reactions  . Skelaxin [Metaxalone] Swelling and Rash    Review of Systems  Constitutional: Negative for chills, fatigue and fever.  HENT: Negative for congestion and trouble swallowing.   Respiratory: Negative for cough, choking, chest tightness, shortness of breath, wheezing and stridor.   Cardiovascular: Negative for chest pain and palpitations.  Musculoskeletal: Negative for arthralgias, joint swelling and myalgias.  Skin: Positive for rash.  Neurological: Negative for dizziness, syncope, weakness, light-headedness and headaches.  Psychiatric/Behavioral: Negative for confusion.  All other systems reviewed and are negative.        Observations/Objective: No vital signs or physical exam, this was a telephone or virtual health encounter.  Pt alert and oriented, answers all questions appropriately, and able to speak in full sentences.    Assessment and Plan: Dean Ferguson was seen today for poison oak.  Diagnoses and all orders for this visit:  Poison oak dermatitis Reported symptoms consistent with poison oak dermatitis. Symptomatic care discussed. Medications as prescribed. Report any new or worsening symptoms. Wear protective clothing when working outside.  -     hydrOXYzine (ATARAX/VISTARIL) 25 MG tablet; Take 1 tablet (25 mg total) by mouth 3 (three) times daily as needed for itching -     predniSONE (STERAPRED UNI-PAK 21 TAB) 10 MG (21) TBPK tablet; As directed x 6 days      Follow Up Instructions: No follow-ups on file.    I discussed the assessment and treatment plan with the patient. The patient was provided an opportunity to ask questions and all were answered. The patient agreed with the plan and demonstrated an understanding of the instructions.   The patient was advised to call back or seek an in-person evaluation if the symptoms worsen or if the condition fails to improve as anticipated.  The above assessment and management plan was discussed with the patient. The patient verbalized understanding of and has agreed to the management plan. Patient is aware to call the clinic if symptoms persist or worsen. Patient is aware when to return to the clinic for a follow-up visit. Patient educated on when it is appropriate to go to the emergency department.    I provided 15  minutes of non-face-to-face time during this encounter. The call started at 1035. The call ended at 1050. The other time was used for coordination of care.    Kari BaarsMichelle Zorawar Strollo, FNP-C [facility] [address] [place], [place] [id number] [phone]

## 2019-04-17 ENCOUNTER — Ambulatory Visit: Payer: 59 | Admitting: Neurology

## 2019-07-08 ENCOUNTER — Encounter: Payer: 59 | Admitting: Physician Assistant

## 2019-07-11 ENCOUNTER — Other Ambulatory Visit: Payer: Self-pay

## 2019-07-12 ENCOUNTER — Encounter: Payer: Self-pay | Admitting: Physician Assistant

## 2019-07-12 ENCOUNTER — Other Ambulatory Visit: Payer: Self-pay

## 2019-07-12 ENCOUNTER — Ambulatory Visit (INDEPENDENT_AMBULATORY_CARE_PROVIDER_SITE_OTHER): Payer: 59 | Admitting: Physician Assistant

## 2019-07-12 VITALS — BP 124/78 | HR 72 | Temp 98.6°F | Resp 20 | Ht 72.0 in | Wt 269.0 lb

## 2019-07-12 DIAGNOSIS — Z1589 Genetic susceptibility to other disease: Secondary | ICD-10-CM

## 2019-07-12 DIAGNOSIS — F410 Panic disorder [episodic paroxysmal anxiety] without agoraphobia: Secondary | ICD-10-CM

## 2019-07-12 DIAGNOSIS — I10 Essential (primary) hypertension: Secondary | ICD-10-CM | POA: Diagnosis not present

## 2019-07-12 DIAGNOSIS — Z0000 Encounter for general adult medical examination without abnormal findings: Secondary | ICD-10-CM

## 2019-07-12 DIAGNOSIS — G4733 Obstructive sleep apnea (adult) (pediatric): Secondary | ICD-10-CM

## 2019-07-12 DIAGNOSIS — F411 Generalized anxiety disorder: Secondary | ICD-10-CM

## 2019-07-12 DIAGNOSIS — Z0001 Encounter for general adult medical examination with abnormal findings: Secondary | ICD-10-CM | POA: Diagnosis not present

## 2019-07-12 MED ORDER — ALPRAZOLAM 0.5 MG PO TABS: 0.5000 mg | ORAL_TABLET | Freq: Two times a day (BID) | ORAL | 1 refills | Status: DC | PRN

## 2019-07-12 MED ORDER — TRAZODONE HCL 50 MG PO TABS: 25.0000 mg | ORAL_TABLET | Freq: Every evening | ORAL | 3 refills | Status: DC | PRN

## 2019-07-12 MED ORDER — LISINOPRIL 10 MG PO TABS: 10.0000 mg | ORAL_TABLET | Freq: Every day | ORAL | 3 refills | Status: DC

## 2019-07-12 NOTE — Patient Instructions (Signed)

## 2019-07-12 NOTE — Progress Notes (Signed)
BP 124/78 (BP Location: Left Arm, Cuff Size: Large)   Pulse 72   Temp 98.6 F (37 C)   Resp 20   Ht 6' (1.829 m)   Wt 269 lb (122 kg)   SpO2 96%   BMI 36.48 kg/m    Subjective:    Patient ID: Dean Ferguson, male    DOB: 1986-02-06, 33 y.o.   MRN: [id number]  HPI: Dean Ferguson is a 33 y.o. male presenting on 07/12/2019 for Annual Exam ((form for work) )  This patient comes in for his annual exam.  Paperwork is completed for his employer.  He will have fasting labs performed today. Hypertension: Well-controlled and tolerating medication well.  He does need refills for the year.  Obstructive sleep apnea: Patient does have CPAP and uses it on the majority of the nights.  I encouraged him to try to use it as much as possible because of his other conditions.  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  There have been no significant exacerbations lately.  Most mornings he wakes up very stiff in his neck.  There is the start of some effusion in his spine.  Anxiety: The patient had tried hydroxyzine and does very well with it whenever its minor anxiety issues.  He states he may use it 2 or 3 times a week.  However when he has to do a presentation or work with his superiors on his job he still gets very anxious.  He states this may happen 2-4 times a month at the most.  We are going to let him try alprazolam for these instances.  I have instructed him to try it at home the first time to make sure he does not feel drowsy.  Past Medical History:  Diagnosis Date  . Anxiety   . GERD (gastroesophageal reflux disease)    Relevant past medical, surgical, family and social history reviewed and updated as indicated. Interim medical history since our last visit reviewed. Allergies and medications reviewed and updated. DATA REVIEWED: CHART IN EPIC  Family History reviewed for pertinent findings.  Review of Systems  Constitutional: Negative.  Negative for appetite change and fatigue.  HENT:  Negative.   Eyes: Negative.  Negative for pain and visual disturbance.  Respiratory: Negative.  Negative for cough, chest tightness, shortness of breath and wheezing.   Cardiovascular: Negative.  Negative for chest pain, palpitations and leg swelling.  Gastrointestinal: Negative.  Negative for abdominal pain, diarrhea, nausea and vomiting.  Endocrine: Negative.   Genitourinary: Negative.   Musculoskeletal: Negative.   Skin: Negative.  Negative for color change and rash.  Neurological: Negative.  Negative for weakness, numbness and headaches.  Psychiatric/Behavioral: The patient is nervous/anxious.     Allergies as of 07/12/2019      Reactions   Skelaxin [metaxalone] Swelling, Rash      Medication List       Accurate as of July 12, 2019 12:24 PM. If you have any questions, ask your nurse or doctor.        STOP taking these medications   allopurinol 100 MG tablet Commonly known as: ZYLOPRIM Stopped by: Terald Sleeper, PA-C   predniSONE 10 MG (21) Tbpk tablet Commonly known as: STERAPRED UNI-PAK 21 TAB Stopped by: Terald Sleeper, PA-C     TAKE these medications   ALPRAZolam 0.5 MG tablet Commonly known as: Xanax Take 1 tablet (0.5 mg total) by mouth 2 (two) times daily as needed for anxiety. Started by: Terald Sleeper,  PA-C   hydrOXYzine 25 MG tablet Commonly known as: ATARAX/VISTARIL Take 1 tablet (25 mg total) by mouth 3 (three) times daily as needed for itching.   lisinopril 10 MG tablet Commonly known as: ZESTRIL Take 1 tablet (10 mg total) by mouth daily.   traZODone 50 MG tablet Commonly known as: DESYREL Take 0.5-1 tablets (25-50 mg total) by mouth at bedtime as needed for sleep.          Objective:    BP 124/78 (BP Location: Left Arm, Cuff Size: Large)   Pulse 72   Temp 98.6 F (37 C)   Resp 20   Ht 6' (1.829 m)   Wt 269 lb (122 kg)   SpO2 96%   BMI 36.48 kg/m   Allergies  Allergen Reactions  . Skelaxin [Metaxalone] Swelling and Rash     Wt Readings from Last 3 Encounters:  07/12/19 269 lb (122 kg)  12/11/18 272 lb 3.2 oz (123.5 kg)  09/27/18 267 lb 9.6 oz (121.4 kg)    Physical Exam Constitutional:      Appearance: He is well-developed.  HENT:     Head: Normocephalic and atraumatic.  Eyes:     Conjunctiva/sclera: Conjunctivae normal.     Pupils: Pupils are equal, round, and reactive to light.  Neck:     Musculoskeletal: Normal range of motion and neck supple.  Cardiovascular:     Rate and Rhythm: Normal rate and regular rhythm.     Heart sounds: Normal heart sounds.  Pulmonary:     Effort: Pulmonary effort is normal.     Breath sounds: Normal breath sounds.  Abdominal:     General: Bowel sounds are normal.     Palpations: Abdomen is soft.  Musculoskeletal: Normal range of motion.  Skin:    General: Skin is warm and dry.     Results for orders placed or performed in visit on 05/24/18  CBC with Differential/Platelet  Result Value Ref Range   WBC 8.8 3.4 - 10.8 x10E3/uL   RBC 4.67 4.14 - 5.80 x10E6/uL   Hemoglobin 16.2 13.0 - 17.7 g/dL   Hematocrit 46.7 37.5 - 51.0 %   MCV 100 (H) 79 - 97 fL   MCH 34.7 (H) 26.6 - 33.0 pg   MCHC 34.7 31.5 - 35.7 g/dL   RDW 13.7 12.3 - 15.4 %   Platelets 175 150 - 450 x10E3/uL   Neutrophils 73 Not Estab. %   Lymphs 17 Not Estab. %   Monocytes 9 Not Estab. %   Eos 1 Not Estab. %   Basos 0 Not Estab. %   Neutrophils Absolute 6.4 1.4 - 7.0 x10E3/uL   Lymphocytes Absolute 1.5 0.7 - 3.1 x10E3/uL   Monocytes Absolute 0.8 0.1 - 0.9 x10E3/uL   EOS (ABSOLUTE) 0.1 0.0 - 0.4 x10E3/uL   Basophils Absolute 0.0 0.0 - 0.2 x10E3/uL   Immature Granulocytes 0 Not Estab. %   Immature Grans (Abs) 0.0 0.0 - 0.1 x10E3/uL  CMP14+EGFR  Result Value Ref Range   Glucose 89 65 - 99 mg/dL   BUN 10 6 - 20 mg/dL   Creatinine, Ser 1.02 0.76 - 1.27 mg/dL   GFR calc non Af Amer 96 >59 mL/min/1.73   GFR calc Af Amer 111 >59 mL/min/1.73   BUN/Creatinine Ratio 10 9 - 20   Sodium 139 134 - 144  mmol/L   Potassium 3.9 3.5 - 5.2 mmol/L   Chloride 98 96 - 106 mmol/L   CO2 24 20 - 29  mmol/L   Calcium 9.8 8.7 - 10.2 mg/dL   Total Protein 7.3 6.0 - 8.5 g/dL   Albumin 4.6 3.5 - 5.5 g/dL   Globulin, Total 2.7 1.5 - 4.5 g/dL   Albumin/Globulin Ratio 1.7 1.2 - 2.2   Bilirubin Total 1.2 0.0 - 1.2 mg/dL   Alkaline Phosphatase 76 39 - 117 IU/L   AST 125 (H) 0 - 40 IU/L   ALT 154 (H) 0 - 44 IU/L  Lipid panel  Result Value Ref Range   Cholesterol, Total 237 (H) 100 - 199 mg/dL   Triglycerides 100 0 - 149 mg/dL   HDL 44 >39 mg/dL   VLDL Cholesterol Cal 20 5 - 40 mg/dL   LDL Calculated 173 (H) 0 - 99 mg/dL   Chol/HDL Ratio 5.4 (H) 0.0 - 5.0 ratio  TSH  Result Value Ref Range   TSH 2.290 0.450 - 4.500 uIU/mL      Assessment & Plan:   1. Well adult exam - CBC with Differential/Platelet - CMP14+EGFR - Lipid panel  2. Essential hypertension - CBC with Differential/Platelet - CMP14+EGFR - lisinopril (ZESTRIL) 10 MG tablet; Take 1 tablet (10 mg total) by mouth daily.  Dispense: 90 tablet; Refill: 3  3. OSA (obstructive sleep apnea) Continue with CPAP use  4. HLA B27 (HLA B27 positive) Continue with rheumatology  5. Generalized anxiety disorder with panic attacks - ALPRAZolam (XANAX) 0.5 MG tablet; Take 1 tablet (0.5 mg total) by mouth 2 (two) times daily as needed for anxiety.  Dispense: 30 tablet; Refill: 1   Continue all other maintenance medications as listed above.  Follow up plan: Recheck in 1 year  Educational handout given for health maintenance  Terald Sleeper PA-C [place] [address]  [place], [place] [address] [phone]   07/12/2019, 12:24 PM

## 2019-07-13 LAB — CMP14+EGFR
ALT: 211 IU/L — ABNORMAL HIGH (ref 0–44)
AST: 196 IU/L — ABNORMAL HIGH (ref 0–40)
Albumin/Globulin Ratio: 2 (ref 1.2–2.2)
Albumin: 4.8 g/dL (ref 4.0–5.0)
Alkaline Phosphatase: 84 IU/L (ref 39–117)
BUN/Creatinine Ratio: 5 — ABNORMAL LOW (ref 9–20)
BUN: 5 mg/dL — ABNORMAL LOW (ref 6–20)
Bilirubin Total: 1.1 mg/dL (ref 0.0–1.2)
CO2: 23 mmol/L (ref 20–29)
Calcium: 9.9 mg/dL (ref 8.7–10.2)
Chloride: 100 mmol/L (ref 96–106)
Creatinine, Ser: 0.92 mg/dL (ref 0.76–1.27)
GFR calc Af Amer: 125 mL/min/{1.73_m2} (ref >59)
GFR calc non Af Amer: 108 mL/min/{1.73_m2} (ref >59)
Globulin, Total: 2.4 g/dL (ref 1.5–4.5)
Glucose: 93 mg/dL (ref 65–99)
Potassium: 4.3 mmol/L (ref 3.5–5.2)
Sodium: 139 mmol/L (ref 134–144)
Total Protein: 7.2 g/dL (ref 6.0–8.5)

## 2019-07-13 LAB — LIPID PANEL
Chol/HDL Ratio: 6.3 ratio — ABNORMAL HIGH (ref 0.0–5.0)
Cholesterol, Total: 251 mg/dL — ABNORMAL HIGH (ref 100–199)
HDL: 40 mg/dL (ref >39)
LDL Chol Calc (NIH): 190 mg/dL — ABNORMAL HIGH (ref 0–99)
Triglycerides: 114 mg/dL (ref 0–149)
VLDL Cholesterol Cal: 21 mg/dL (ref 5–40)

## 2019-07-13 LAB — CBC WITH DIFFERENTIAL/PLATELET
Basophils Absolute: 0.1 10*3/uL (ref 0.0–0.2)
Basos: 1 %
EOS (ABSOLUTE): 0.1 10*3/uL (ref 0.0–0.4)
Eos: 1 %
Hematocrit: 46.1 % (ref 37.5–51.0)
Hemoglobin: 16.5 g/dL (ref 13.0–17.7)
Immature Grans (Abs): 0 10*3/uL (ref 0.0–0.1)
Immature Granulocytes: 0 %
Lymphocytes Absolute: 1.6 10*3/uL (ref 0.7–3.1)
Lymphs: 20 %
MCH: 35.2 pg — ABNORMAL HIGH (ref 26.6–33.0)
MCHC: 35.8 g/dL — ABNORMAL HIGH (ref 31.5–35.7)
MCV: 98 fL — ABNORMAL HIGH (ref 79–97)
Monocytes Absolute: 0.7 10*3/uL (ref 0.1–0.9)
Monocytes: 9 %
Neutrophils Absolute: 5.4 10*3/uL (ref 1.4–7.0)
Neutrophils: 69 %
Platelets: 196 10*3/uL (ref 150–450)
RBC: 4.69 x10E6/uL (ref 4.14–5.80)
RDW: 12.7 % (ref 11.6–15.4)
WBC: 7.9 10*3/uL (ref 3.4–10.8)

## 2019-07-15 ENCOUNTER — Other Ambulatory Visit: Payer: Self-pay | Admitting: Physician Assistant

## 2019-07-15 ENCOUNTER — Encounter: Payer: Self-pay | Admitting: Physician Assistant

## 2019-07-15 DIAGNOSIS — K760 Fatty (change of) liver, not elsewhere classified: Secondary | ICD-10-CM

## 2019-07-15 HISTORY — DX: Fatty (change of) liver, not elsewhere classified: K76.0

## 2019-07-15 MED ORDER — ATORVASTATIN CALCIUM 20 MG PO TABS: 20.0000 mg | ORAL_TABLET | Freq: Every day | ORAL | 3 refills | Status: DC

## 2019-07-17 ENCOUNTER — Telehealth: Payer: Self-pay | Admitting: Neurology

## 2019-07-17 ENCOUNTER — Ambulatory Visit: Payer: 59 | Admitting: Neurology

## 2019-07-17 NOTE — Telephone Encounter (Signed)
Patient was no show to follow up visit.

## 2019-07-18 ENCOUNTER — Encounter: Payer: Self-pay | Admitting: Neurology

## 2019-08-05 ENCOUNTER — Other Ambulatory Visit: Payer: Self-pay | Admitting: Physician Assistant

## 2019-08-05 DIAGNOSIS — F411 Generalized anxiety disorder: Secondary | ICD-10-CM

## 2019-08-05 DIAGNOSIS — F410 Panic disorder [episodic paroxysmal anxiety] without agoraphobia: Secondary | ICD-10-CM

## 2019-08-27 ENCOUNTER — Encounter: Payer: Self-pay | Admitting: Physician Assistant

## 2019-08-27 ENCOUNTER — Other Ambulatory Visit: Payer: Self-pay | Admitting: Physician Assistant

## 2019-08-27 DIAGNOSIS — F411 Generalized anxiety disorder: Secondary | ICD-10-CM

## 2019-08-27 MED ORDER — ALPRAZOLAM 0.5 MG PO TABS: 0.5000 mg | ORAL_TABLET | Freq: Two times a day (BID) | ORAL | 0 refills | Status: DC | PRN

## 2019-08-27 MED ORDER — BUSPIRONE HCL 10 MG PO TABS: 10.0000 mg | ORAL_TABLET | Freq: Three times a day (TID) | ORAL | 1 refills | Status: DC

## 2019-10-15 ENCOUNTER — Other Ambulatory Visit: Payer: Self-pay | Admitting: Physician Assistant

## 2019-10-15 ENCOUNTER — Encounter: Payer: Self-pay | Admitting: Physician Assistant

## 2019-10-15 ENCOUNTER — Ambulatory Visit (INDEPENDENT_AMBULATORY_CARE_PROVIDER_SITE_OTHER): Payer: 59 | Admitting: Physician Assistant

## 2019-10-15 ENCOUNTER — Other Ambulatory Visit: Payer: Self-pay

## 2019-10-15 VITALS — BP 138/94 | HR 106 | Temp 97.5°F | Ht 72.0 in | Wt 271.6 lb

## 2019-10-15 DIAGNOSIS — M549 Dorsalgia, unspecified: Secondary | ICD-10-CM

## 2019-10-15 DIAGNOSIS — I10 Essential (primary) hypertension: Secondary | ICD-10-CM

## 2019-10-15 DIAGNOSIS — G8929 Other chronic pain: Secondary | ICD-10-CM

## 2019-10-15 DIAGNOSIS — M109 Gout, unspecified: Secondary | ICD-10-CM

## 2019-10-15 DIAGNOSIS — L918 Other hypertrophic disorders of the skin: Secondary | ICD-10-CM

## 2019-10-15 MED ORDER — LISINOPRIL 10 MG PO TABS: 10.0000 mg | ORAL_TABLET | Freq: Every day | ORAL | 3 refills | Status: DC

## 2019-10-15 NOTE — Progress Notes (Signed)
Acute Office Visit  Subjective:    Patient ID: Dean Ferguson, male    DOB: Oct 15, 1985, 34 y.o.   MRN: [id number]  Chief Complaint  Patient presents with  . Hypertension    Hypertension This is a chronic problem. The current episode started more than 1 year ago. The problem is unchanged. The problem is uncontrolled. Pertinent negatives include no chest pain, headaches, palpitations or shortness of breath. Risk factors for coronary artery disease include dyslipidemia, male gender and obesity. Past treatments include ACE inhibitors. The current treatment provides moderate improvement.  Back Pain This is a chronic problem. The current episode started more than 1 year ago. The problem occurs daily. The problem has been waxing and waning since onset. The pain is present in the lumbar spine. The quality of the pain is described as aching. The pain is at a severity of 4/10. Pertinent negatives include no abdominal pain, chest pain, headaches, numbness or weakness.  Hyperlipidemia This is a new problem. The current episode started more than 1 month ago. The problem is uncontrolled. Recent lipid tests were reviewed and are high. Exacerbating diseases include obesity. Pertinent negatives include no chest pain or shortness of breath. He is currently on no antihyperlipidemic treatment. Risk factors for coronary artery disease include dyslipidemia, hypertension and male sex.     Past Medical History:  Diagnosis Date  . Anxiety   . Fatty liver 07/15/2019  . GERD (gastroesophageal reflux disease)     Past Surgical History:  Procedure Laterality Date  . LEG SURGERY Right    FELL THROUGH GLASS  . SHOULDER SURGERY Right    FELL THROUGH GLASS    Family History  Problem Relation Age of Onset  . Immunodeficiency Mother   . Hyperlipidemia Father   . Hypertension Father     Social History   Socioeconomic History  . Marital status: Married    Spouse name: Not on file  . Number of children:  Not on file  . Years of education: Not on file  . Highest education level: Not on file  Occupational History  . Not on file  Tobacco Use  . Smoking status: Never Smoker  . Smokeless tobacco: Former Systems developer    Types: Chew  Substance and Sexual Activity  . Alcohol use: Yes    Comment: social  . Drug use: No  . Sexual activity: Not on file  Other Topics Concern  . Not on file  Social History Narrative  . Not on file   Social Determinants of Health   Financial Resource Strain:   . Difficulty of Paying Living Expenses: Not on file  Food Insecurity:   . Worried About Charity fundraiser in the Last Year: Not on file  . Ran Out of Food in the Last Year: Not on file  Transportation Needs:   . Lack of Transportation (Medical): Not on file  . Lack of Transportation (Non-Medical): Not on file  Physical Activity:   . Days of Exercise per Week: Not on file  . Minutes of Exercise per Session: Not on file  Stress:   . Feeling of Stress : Not on file  Social Connections:   . Frequency of Communication with Friends and Family: Not on file  . Frequency of Social Gatherings with Friends and Family: Not on file  . Attends Religious Services: Not on file  . Active Member of Clubs or Organizations: Not on file  . Attends Archivist Meetings: Not on file  .  Marital Status: Not on file  Intimate Partner Violence:   . Fear of Current or Ex-Partner: Not on file  . Emotionally Abused: Not on file  . Physically Abused: Not on file  . Sexually Abused: Not on file    Outpatient Medications Prior to Visit  Medication Sig Dispense Refill  . ALPRAZolam (XANAX) 0.5 MG tablet Take 1 tablet (0.5 mg total) by mouth 2 (two) times daily as needed for anxiety. 30 tablet 0  . busPIRone (BUSPAR) 10 MG tablet Take 1 tablet (10 mg total) by mouth 3 (three) times daily. Start one daily 1 wee, then BID 1 week, then TID 90 tablet 1  . hydrOXYzine (ATARAX/VISTARIL) 25 MG tablet Take 1 tablet (25 mg total)  by mouth 3 (three) times daily as needed for itching. 60 tablet 2  . lisinopril (ZESTRIL) 10 MG tablet Take 1 tablet (10 mg total) by mouth daily. 90 tablet 3  . traZODone (DESYREL) 50 MG tablet Take 0.5-1 tablets (25-50 mg total) by mouth at bedtime as needed for sleep. 30 tablet 3  . atorvastatin (LIPITOR) 20 MG tablet Take 1 tablet (20 mg total) by mouth at bedtime. Take coenzyme Q10 with the medication (Patient not taking: Reported on 10/15/2019) 90 tablet 3   No facility-administered medications prior to visit.    Allergies  Allergen Reactions  . Skelaxin [Metaxalone] Swelling and Rash    Review of Systems  Constitutional: Negative.  Negative for appetite change and fatigue.  Eyes: Negative for pain and visual disturbance.  Respiratory: Negative.  Negative for cough, chest tightness, shortness of breath and wheezing.   Cardiovascular: Negative.  Negative for chest pain, palpitations and leg swelling.  Gastrointestinal: Negative.  Negative for abdominal pain, diarrhea, nausea and vomiting.  Genitourinary: Negative.   Musculoskeletal: Positive for back pain.  Skin: Negative.  Negative for color change and rash.  Neurological: Negative.  Negative for weakness, numbness and headaches.  Psychiatric/Behavioral: Negative.        Objective:    Physical Exam Vitals and nursing note reviewed.  Constitutional:      General: He is not in acute distress.    Appearance: He is well-developed.  HENT:     Head: Normocephalic and atraumatic.  Eyes:     Conjunctiva/sclera: Conjunctivae normal.     Pupils: Pupils are equal, round, and reactive to light.  Cardiovascular:     Rate and Rhythm: Normal rate and regular rhythm.     Heart sounds: Normal heart sounds.  Pulmonary:     Effort: Pulmonary effort is normal. No respiratory distress.     Breath sounds: Normal breath sounds.  Skin:    General: Skin is warm and dry.  Psychiatric:        Behavior: Behavior normal.     BP (!) 138/94    Pulse (!) 106   Temp (!) 97.5 F (36.4 C) (Temporal)   Ht 6' (1.829 m)   Wt 271 lb 9.6 oz (123.2 kg)   SpO2 96%   BMI 36.84 kg/m  Wt Readings from Last 3 Encounters:  10/15/19 271 lb 9.6 oz (123.2 kg)  07/12/19 269 lb (122 kg)  12/11/18 272 lb 3.2 oz (123.5 kg)    There are no preventive care reminders to display for this patient.  There are no preventive care reminders to display for this patient.   Lab Results  Component Value Date   TSH 2.290 05/24/2018   Lab Results  Component Value Date   WBC 7.9 07/12/2019  HGB 16.5 07/12/2019   HCT 46.1 07/12/2019   MCV 98 (H) 07/12/2019   PLT 196 07/12/2019   Lab Results  Component Value Date   NA 139 07/12/2019   K 4.3 07/12/2019   CO2 23 07/12/2019   GLUCOSE 93 07/12/2019   BUN 5 (L) 07/12/2019   CREATININE 0.92 07/12/2019   BILITOT 1.1 07/12/2019   ALKPHOS 84 07/12/2019   AST 196 (H) 07/12/2019   ALT 211 (H) 07/12/2019   PROT 7.2 07/12/2019   ALBUMIN 4.8 07/12/2019   CALCIUM 9.9 07/12/2019   Lab Results  Component Value Date   CHOL 251 (H) 07/12/2019   Lab Results  Component Value Date   HDL 40 07/12/2019   Lab Results  Component Value Date   LDLCALC 190 (H) 07/12/2019   Lab Results  Component Value Date   TRIG 114 07/12/2019   Lab Results  Component Value Date   CHOLHDL 6.3 (H) 07/12/2019   No results found for: HGBA1C     Assessment & Plan:   Problem List Items Addressed This Visit      Cardiovascular and Mediastinum   Essential hypertension     Musculoskeletal and Integument   Skin tag     Other   Gout - Primary   Chronic back pain       No orders of the defined types were placed in this encounter.    Remus Loffler, PA-C

## 2020-01-16 ENCOUNTER — Ambulatory Visit: Payer: 59 | Admitting: Physician Assistant

## 2020-01-16 ENCOUNTER — Ambulatory Visit: Payer: 59 | Admitting: Family

## 2020-02-03 ENCOUNTER — Encounter: Payer: Self-pay | Admitting: Family

## 2020-02-03 ENCOUNTER — Ambulatory Visit (INDEPENDENT_AMBULATORY_CARE_PROVIDER_SITE_OTHER): Payer: 59 | Admitting: Family

## 2020-02-03 ENCOUNTER — Other Ambulatory Visit: Payer: Self-pay

## 2020-02-03 VITALS — BP 152/95 | HR 106 | Temp 98.1°F | Ht 72.0 in | Wt 273.0 lb

## 2020-02-03 DIAGNOSIS — F320 Major depressive disorder, single episode, mild: Secondary | ICD-10-CM

## 2020-02-03 DIAGNOSIS — G4733 Obstructive sleep apnea (adult) (pediatric): Secondary | ICD-10-CM | POA: Diagnosis not present

## 2020-02-03 DIAGNOSIS — Z79899 Other long term (current) drug therapy: Secondary | ICD-10-CM

## 2020-02-03 DIAGNOSIS — I10 Essential (primary) hypertension: Secondary | ICD-10-CM

## 2020-02-03 DIAGNOSIS — K760 Fatty (change of) liver, not elsewhere classified: Secondary | ICD-10-CM

## 2020-02-03 DIAGNOSIS — F411 Generalized anxiety disorder: Secondary | ICD-10-CM | POA: Diagnosis not present

## 2020-02-03 DIAGNOSIS — F5104 Psychophysiologic insomnia: Secondary | ICD-10-CM

## 2020-02-03 DIAGNOSIS — E669 Obesity, unspecified: Secondary | ICD-10-CM | POA: Insufficient documentation

## 2020-02-03 DIAGNOSIS — R748 Abnormal levels of other serum enzymes: Secondary | ICD-10-CM | POA: Insufficient documentation

## 2020-02-03 DIAGNOSIS — F410 Panic disorder [episodic paroxysmal anxiety] without agoraphobia: Secondary | ICD-10-CM

## 2020-02-03 DIAGNOSIS — F1010 Alcohol abuse, uncomplicated: Secondary | ICD-10-CM

## 2020-02-03 MED ORDER — LISINOPRIL 20 MG PO TABS
20.0000 mg | ORAL_TABLET | Freq: Every day | ORAL | 3 refills | Status: DC
Start: 2020-02-03 — End: 2021-02-23

## 2020-02-03 MED ORDER — ALPRAZOLAM 0.5 MG PO TABS: 0.5000 mg | ORAL_TABLET | Freq: Every evening | ORAL | 1 refills | Status: DC | PRN

## 2020-02-03 MED ORDER — BUSPIRONE HCL 15 MG PO TABS: 15.0000 mg | ORAL_TABLET | Freq: Three times a day (TID) | ORAL | 2 refills | Status: DC | PRN

## 2020-02-03 MED ORDER — ATORVASTATIN CALCIUM 20 MG PO TABS: 20.0000 mg | ORAL_TABLET | Freq: Every day | ORAL | 3 refills | Status: DC

## 2020-02-03 MED ORDER — LISINOPRIL 10 MG PO TABS: 10.0000 mg | ORAL_TABLET | Freq: Every day | ORAL | 3 refills | Status: DC

## 2020-02-03 NOTE — Patient Instructions (Signed)
Fatty Liver Disease  Fatty liver disease occurs when too much fat has built up in your liver cells. Fatty liver disease is also called hepatic steatosis or steatohepatitis. The liver removes harmful substances from your bloodstream and produces fluids that your body needs. It also helps your body use and store energy from the food you eat. In many cases, fatty liver disease does not cause symptoms or problems. It is often diagnosed when tests are being done for other reasons. However, over time, fatty liver can cause inflammation that may lead to more serious liver problems, such as scarring of the liver (cirrhosis) and liver failure. Fatty liver is associated with insulin resistance, increased body fat, high blood pressure (hypertension), and high cholesterol. These are features of metabolic syndrome and increase your risk for stroke, diabetes, and heart disease. What are the causes? This condition may be caused by:  Drinking too much alcohol.  Poor nutrition.  Obesity.  Cushing's syndrome.  Diabetes.  High cholesterol.  Certain drugs.  Poisons.  Some viral infections.  Pregnancy. What increases the risk? You are more likely to develop this condition if you:  Abuse alcohol.  Are overweight.  Have diabetes.  Have hepatitis.  Have a high triglyceride level.  Are pregnant. What are the signs or symptoms? Fatty liver disease often does not cause symptoms. If symptoms do develop, they can include:  Fatigue.  Weakness.  Weight loss.  Confusion.  Abdominal pain.  Nausea and vomiting.  Yellowing of your skin and the white parts of your eyes (jaundice).  Itchy skin. How is this diagnosed? This condition may be diagnosed by:  A physical exam and medical history.  Blood tests.  Imaging tests, such as an ultrasound, CT scan, or MRI.  A liver biopsy. A small sample of liver tissue is removed using a needle. The sample is then looked at under a microscope. How  is this treated? Fatty liver disease is often caused by other health conditions. Treatment for fatty liver may involve medicines and lifestyle changes to manage conditions such as:  Alcoholism.  High cholesterol.  Diabetes.  Being overweight or obese. Follow these instructions at home:   Do not drink alcohol. If you have trouble quitting, ask your health care provider how to safely quit with the help of medicine or a supervised program. This is important to keep your condition from getting worse.  Eat a healthy diet as told by your health care provider. Ask your health care provider about working with a diet and nutrition specialist (dietitian) to develop an eating plan.  Exercise regularly. This can help you lose weight and control your cholesterol and diabetes. Talk to your health care provider about an exercise plan and which activities are best for you.  Take over-the-counter and prescription medicines only as told by your health care provider.  Keep all follow-up visits as told by your health care provider. This is important. Contact a health care provider if: You have trouble controlling your:  Blood sugar. This is especially important if you have diabetes.  Cholesterol.  Drinking of alcohol. Get help right away if:  You have abdominal pain.  You have jaundice.  You have nausea and vomiting.  You vomit blood or material that looks like coffee grounds.  You have stools that are black, tar-like, or bloody. Summary  Fatty liver disease develops when too much fat builds up in the cells of your liver.  Fatty liver disease often causes no symptoms or problems. However, over   time, fatty liver can cause inflammation that may lead to more serious liver problems, such as scarring of the liver (cirrhosis).  You are more likely to develop this condition if you abuse alcohol, are pregnant, are overweight, have diabetes, have hepatitis, or have high triglyceride  levels.  Contact your health care provider if you have trouble controlling your weight, blood sugar, cholesterol, or drinking of alcohol. This information is not intended to replace advice given to you by your health care provider. Make sure you discuss any questions you have with your health care provider. Document Revised: 08/25/2017 Document Reviewed: 06/21/2017 Elsevier Patient Education  2020 [employer]  

## 2020-02-03 NOTE — Progress Notes (Signed)
Subjective:    Patient ID: Dean Ferguson, male    DOB: 1985-11-23, 34 y.o.   MRN: [id number]  Chief Complaint  Patient presents with  . Medical Management of Chronic Issues    [name]    Pt presents to the office today to establish care with me. Pt is followed by Pulmonologist  annually for CPAP. This is stable and uses his CPAP nightly.   He reports drinking at least 8 oz of [place] daily. He does have elevated liver enzymes.  Hypertension This is a chronic problem. The current episode started more than 1 year ago. The problem has been waxing and waning since onset. The problem is uncontrolled. Associated symptoms include anxiety and shortness of breath ("at times"). Pertinent negatives include no malaise/fatigue or peripheral edema. Past treatments include ACE inhibitors. The current treatment provides mild improvement.  Hyperlipidemia This is a chronic problem. The current episode started more than 1 year ago. The problem is uncontrolled. Exacerbating diseases include obesity. Associated symptoms include shortness of breath ("at times"). Current antihyperlipidemic treatment includes statins. The current treatment provides moderate improvement of lipids. Risk factors for coronary artery disease include dyslipidemia, male sex, hypertension and a sedentary lifestyle.  Anxiety Presents for follow-up visit. Symptoms include decreased concentration, depressed mood, excessive worry, insomnia, irritability, nervous/anxious behavior, restlessness and shortness of breath ("at times"). Symptoms occur most days. The severity of symptoms is moderate.    Insomnia Primary symptoms: difficulty falling asleep, frequent awakening, no malaise/fatigue.  The current episode started more than one year. PMH includes: depression.  Depression        This is a chronic problem.  The current episode started more than 1 year ago.   The onset quality is gradual.   The problem occurs intermittently.  Associated  symptoms include decreased concentration, insomnia, irritable, restlessness, decreased interest and sad.  Associated symptoms include no helplessness and no hopelessness.  Past treatments include nothing.  Past medical history includes anxiety.       Review of Systems  Constitutional: Positive for irritability. Negative for malaise/fatigue.  Respiratory: Positive for shortness of breath ("at times").   Psychiatric/Behavioral: Positive for decreased concentration and depression. The patient is nervous/anxious and has insomnia.   All other systems reviewed and are negative.      Objective:   Physical Exam Vitals reviewed.  Constitutional:      General: He is irritable. He is not in acute distress.    Appearance: He is well-developed.  HENT:     Head: Normocephalic.     Right Ear: Tympanic membrane normal.     Left Ear: Tympanic membrane normal.  Eyes:     General:        Right eye: No discharge.        Left eye: No discharge.     Pupils: Pupils are equal, round, and reactive to light.  Neck:     Thyroid: No thyromegaly.  Cardiovascular:     Rate and Rhythm: Normal rate and regular rhythm.     Heart sounds: Normal heart sounds. No murmur.  Pulmonary:     Effort: Pulmonary effort is normal. No respiratory distress.     Breath sounds: Normal breath sounds. No wheezing.  Abdominal:     General: Bowel sounds are normal. There is no distension.     Palpations: Abdomen is soft.     Tenderness: There is no abdominal tenderness.  Musculoskeletal:        General: No tenderness. Normal range of  motion.     Cervical back: Normal range of motion and neck supple.  Skin:    General: Skin is warm and dry.     Findings: No erythema or rash.  Neurological:     Mental Status: He is alert and oriented to person, place, and time.     Cranial Nerves: No cranial nerve deficit.     Deep Tendon Reflexes: Reflexes are normal and symmetric.  Psychiatric:        Behavior: Behavior normal.         Thought Content: Thought content normal.        Judgment: Judgment normal.       BP (!) 152/95   Pulse (!) 106   Temp 98.1 F (36.7 C) (Temporal)   Ht 6' (1.829 m)   Wt 273 lb (123.8 kg)   SpO2 96%   BMI 37.03 kg/m      Assessment & Plan:  Quenten Nawaz comes in today with chief complaint of Medical Management of Chronic Issues Ronnald Ramp patient )   Diagnosis and orders addressed:  1. Essential hypertension Will increase lisinopril to 20 mg from 10 mg -Daily blood pressure log given with instructions on how to fill out and told to bring to next visit -Dash diet information given -Exercise encouraged - Stress Management  -Continue current meds -RTO in 4 weeks  - BMP8+EGFR - CBC with Differential/Platelet - Ambulatory referral to Gastroenterology  2. Generalized anxiety disorder with panic attacks -Pt will try Buspar first as needed, will try to hold off on xanax. #30 with one refill given - ALPRAZolam (XANAX) 0.5 MG tablet; Take 1 tablet (0.5 mg total) by mouth at bedtime as needed for anxiety.  Dispense: 30 tablet; Refill: 1 - BMP8+EGFR - CBC with Differential/Platelet - busPIRone (BUSPAR) 15 MG tablet; Take 1 tablet (15 mg total) by mouth 3 (three) times daily as needed.  Dispense: 60 tablet; Refill: 2  3. OSA (obstructive sleep apnea) - BMP8+EGFR - CBC with Differential/Platelet  4. Depression, major, single episode, mild (HCC) - BMP8+EGFR - CBC with Differential/Platelet  5. Psychophysiological insomnia - BMP8+EGFR - CBC with Differential/Platelet  6. GAD (generalized anxiety disorder) - BMP8+EGFR - CBC with Differential/Platelet  7. Controlled substance agreement signed - BMP8+EGFR - CBC with Differential/Platelet - ToxASSURE Select 13 (MW), Urine  8. Morbid obesity (HCC) - BMP8+EGFR - CBC with Differential/Platelet  9. Elevated liver enzymes - BMP8+EGFR - CBC with Differential/Platelet - Hepatic function panel - Ambulatory referral to  Gastroenterology  10. Alcohol abuse   11. Fatty liver Long discussion with patient about stopping and decreasing alcohol, low fat, weight loss, and exercise.  [place] ordered Referral to GI placed   Pt reviewed in [place] controlled database- No red flags, contract and drug screen up dated today Labs pending Health Maintenance reviewed Diet and exercise encouraged  Follow up plan: 4 weeks to recheck HTN   Evelina Dun, FNP

## 2020-02-04 LAB — CBC WITH DIFFERENTIAL/PLATELET
Basophils Absolute: 0.1 10*3/uL (ref 0.0–0.2)
Basos: 1 %
EOS (ABSOLUTE): 0.1 10*3/uL (ref 0.0–0.4)
Eos: 1 %
Hematocrit: 48 % (ref 37.5–51.0)
Hemoglobin: 16.5 g/dL (ref 13.0–17.7)
Immature Grans (Abs): 0 10*3/uL (ref 0.0–0.1)
Immature Granulocytes: 0 %
Lymphocytes Absolute: 2.5 10*3/uL (ref 0.7–3.1)
Lymphs: 30 %
MCH: 34.2 pg — ABNORMAL HIGH (ref 26.6–33.0)
MCHC: 34.4 g/dL (ref 31.5–35.7)
MCV: 100 fL — ABNORMAL HIGH (ref 79–97)
Monocytes Absolute: 0.8 10*3/uL (ref 0.1–0.9)
Monocytes: 10 %
Neutrophils Absolute: 4.9 10*3/uL (ref 1.4–7.0)
Neutrophils: 58 %
Platelets: 236 10*3/uL (ref 150–450)
RBC: 4.82 x10E6/uL (ref 4.14–5.80)
RDW: 14.5 % (ref 11.6–15.4)
WBC: 8.4 10*3/uL (ref 3.4–10.8)

## 2020-02-04 LAB — BMP8+EGFR
BUN/Creatinine Ratio: 7 — ABNORMAL LOW (ref 9–20)
BUN: 6 mg/dL (ref 6–20)
CO2: 25 mmol/L (ref 20–29)
Calcium: 10.3 mg/dL — ABNORMAL HIGH (ref 8.7–10.2)
Chloride: 100 mmol/L (ref 96–106)
Creatinine, Ser: 0.91 mg/dL (ref 0.76–1.27)
GFR calc Af Amer: 126 mL/min/{1.73_m2} (ref >59)
GFR calc non Af Amer: 109 mL/min/{1.73_m2} (ref >59)
Glucose: 89 mg/dL (ref 65–99)
Potassium: 4.5 mmol/L (ref 3.5–5.2)
Sodium: 141 mmol/L (ref 134–144)

## 2020-02-04 LAB — HEPATIC FUNCTION PANEL
ALT: 109 IU/L — ABNORMAL HIGH (ref 0–44)
AST: 79 IU/L — ABNORMAL HIGH (ref 0–40)
Albumin: 4.6 g/dL (ref 4.0–5.0)
Alkaline Phosphatase: 97 IU/L (ref 39–117)
Bilirubin Total: 0.7 mg/dL (ref 0.0–1.2)
Bilirubin, Direct: 0.27 mg/dL (ref 0.00–0.40)
Total Protein: 7.2 g/dL (ref 6.0–8.5)

## 2020-02-06 ENCOUNTER — Other Ambulatory Visit: Payer: Self-pay | Admitting: Family

## 2020-02-06 LAB — TOXASSURE SELECT 13 (MW), URINE

## 2020-02-10 ENCOUNTER — Encounter: Payer: Self-pay | Admitting: Internal Medicine

## 2020-02-26 ENCOUNTER — Ambulatory Visit: Payer: Self-pay | Admitting: Gastroenterology

## 2020-03-02 ENCOUNTER — Other Ambulatory Visit: Payer: Self-pay

## 2020-03-02 ENCOUNTER — Encounter: Payer: Self-pay | Admitting: Family

## 2020-03-02 ENCOUNTER — Ambulatory Visit (INDEPENDENT_AMBULATORY_CARE_PROVIDER_SITE_OTHER): Payer: 59 | Admitting: Family

## 2020-03-02 VITALS — BP 136/80 | HR 116 | Temp 97.6°F | Ht 73.0 in | Wt 273.2 lb

## 2020-03-02 DIAGNOSIS — I10 Essential (primary) hypertension: Secondary | ICD-10-CM

## 2020-03-02 NOTE — Patient Instructions (Signed)
Fatty Liver Disease  Fatty liver disease occurs when too much fat has built up in your liver cells. Fatty liver disease is also called hepatic steatosis or steatohepatitis. The liver removes harmful substances from your bloodstream and produces fluids that your body needs. It also helps your body use and store energy from the food you eat. In many cases, fatty liver disease does not cause symptoms or problems. It is often diagnosed when tests are being done for other reasons. However, over time, fatty liver can cause inflammation that may lead to more serious liver problems, such as scarring of the liver (cirrhosis) and liver failure. Fatty liver is associated with insulin resistance, increased body fat, high blood pressure (hypertension), and high cholesterol. These are features of metabolic syndrome and increase your risk for stroke, diabetes, and heart disease. What are the causes? This condition may be caused by:  Drinking too much alcohol.  Poor nutrition.  Obesity.  Cushing's syndrome.  Diabetes.  High cholesterol.  Certain drugs.  Poisons.  Some viral infections.  Pregnancy. What increases the risk? You are more likely to develop this condition if you:  Abuse alcohol.  Are overweight.  Have diabetes.  Have hepatitis.  Have a high triglyceride level.  Are pregnant. What are the signs or symptoms? Fatty liver disease often does not cause symptoms. If symptoms do develop, they can include:  Fatigue.  Weakness.  Weight loss.  Confusion.  Abdominal pain.  Nausea and vomiting.  Yellowing of your skin and the white parts of your eyes (jaundice).  Itchy skin. How is this diagnosed? This condition may be diagnosed by:  A physical exam and medical history.  Blood tests.  Imaging tests, such as an ultrasound, CT scan, or MRI.  A liver biopsy. A small sample of liver tissue is removed using a needle. The sample is then looked at under a microscope. How  is this treated? Fatty liver disease is often caused by other health conditions. Treatment for fatty liver may involve medicines and lifestyle changes to manage conditions such as:  Alcoholism.  High cholesterol.  Diabetes.  Being overweight or obese. Follow these instructions at home:   Do not drink alcohol. If you have trouble quitting, ask your health care provider how to safely quit with the help of medicine or a supervised program. This is important to keep your condition from getting worse.  Eat a healthy diet as told by your health care provider. Ask your health care provider about working with a diet and nutrition specialist (dietitian) to develop an eating plan.  Exercise regularly. This can help you lose weight and control your cholesterol and diabetes. Talk to your health care provider about an exercise plan and which activities are best for you.  Take over-the-counter and prescription medicines only as told by your health care provider.  Keep all follow-up visits as told by your health care provider. This is important. Contact a health care provider if: You have trouble controlling your:  Blood sugar. This is especially important if you have diabetes.  Cholesterol.  Drinking of alcohol. Get help right away if:  You have abdominal pain.  You have jaundice.  You have nausea and vomiting.  You vomit blood or material that looks like coffee grounds.  You have stools that are black, tar-like, or bloody. Summary  Fatty liver disease develops when too much fat builds up in the cells of your liver.  Fatty liver disease often causes no symptoms or problems. However, over   time, fatty liver can cause inflammation that may lead to more serious liver problems, such as scarring of the liver (cirrhosis).  You are more likely to develop this condition if you abuse alcohol, are pregnant, are overweight, have diabetes, have hepatitis, or have high triglyceride  levels.  Contact your health care provider if you have trouble controlling your weight, blood sugar, cholesterol, or drinking of alcohol. This information is not intended to replace advice given to you by your health care provider. Make sure you discuss any questions you have with your health care provider. Document Revised: 08/25/2017 Document Reviewed: 06/21/2017 Elsevier Patient Education  2020 [employer]  

## 2020-03-02 NOTE — Progress Notes (Signed)
   Subjective:    Patient ID: Dean Ferguson, male    DOB: Feb 08, 1986, 34 y.o.   MRN: [id number]  Chief Complaint  Patient presents with  . Hypertension    4 week follow up   Pt presents to the office today to recheck HTN.  Hypertension This is a chronic problem. The current episode started more than 1 year ago. The problem has been resolved since onset. The problem is controlled. Pertinent negatives include no malaise/fatigue, peripheral edema or shortness of breath. Risk factors for coronary artery disease include obesity and male gender. The current treatment provides moderate improvement.      Review of Systems  Constitutional: Negative for malaise/fatigue.  Respiratory: Negative for shortness of breath.   All other systems reviewed and are negative.      Objective:   Physical Exam Vitals reviewed.  Constitutional:      General: He is not in acute distress.    Appearance: He is well-developed. He is obese.  HENT:     Head: Normocephalic.     Right Ear: Tympanic membrane normal.     Left Ear: Tympanic membrane normal.  Eyes:     General:        Right eye: No discharge.        Left eye: No discharge.     Pupils: Pupils are equal, round, and reactive to light.  Neck:     Thyroid: No thyromegaly.  Cardiovascular:     Rate and Rhythm: Normal rate and regular rhythm.     Heart sounds: Normal heart sounds. No murmur.  Pulmonary:     Effort: Pulmonary effort is normal. No respiratory distress.     Breath sounds: Normal breath sounds. No wheezing.  Abdominal:     General: Bowel sounds are normal. There is no distension.     Palpations: Abdomen is soft.     Tenderness: There is no abdominal tenderness.  Musculoskeletal:        General: No tenderness. Normal range of motion.     Cervical back: Normal range of motion and neck supple.  Skin:    General: Skin is warm and dry.     Findings: No erythema or rash.  Neurological:     Mental Status: He is alert and oriented to  person, place, and time.     Cranial Nerves: No cranial nerve deficit.     Deep Tendon Reflexes: Reflexes are normal and symmetric.  Psychiatric:        Behavior: Behavior normal.        Thought Content: Thought content normal.        Judgment: Judgment normal.       BP 136/80   Pulse (!) 116   Temp 97.6 F (36.4 C) (Temporal)   Ht '6\' 1"'$  (1.854 m)   Wt 273 lb 3.2 oz (123.9 kg)   SpO2 92%   BMI 36.04 kg/m      Assessment & Plan:  Kyjuan Ferguson comes in today with chief complaint of Hypertension (4 week follow up)   Diagnosis and orders addressed:  1. Essential hypertension -Dash diet information given -Exercise encouraged - Stress Management  -Continue current meds -RTO in 6 months  - CMP14+EGFR   Health Maintenance reviewed Diet and exercise encouraged  Follow up plan: 6 months    Dean Dun, FNP

## 2020-03-03 LAB — CMP14+EGFR
ALT: 149 IU/L — ABNORMAL HIGH (ref 0–44)
AST: 141 IU/L — ABNORMAL HIGH (ref 0–40)
Albumin/Globulin Ratio: 2 (ref 1.2–2.2)
Albumin: 4.7 g/dL (ref 4.0–5.0)
Alkaline Phosphatase: 87 IU/L (ref 48–121)
BUN/Creatinine Ratio: 10 (ref 9–20)
BUN: 12 mg/dL (ref 6–20)
Bilirubin Total: 1.5 mg/dL — ABNORMAL HIGH (ref 0.0–1.2)
CO2: 22 mmol/L (ref 20–29)
Calcium: 10.3 mg/dL — ABNORMAL HIGH (ref 8.7–10.2)
Chloride: 102 mmol/L (ref 96–106)
Creatinine, Ser: 1.18 mg/dL (ref 0.76–1.27)
GFR calc Af Amer: 92 mL/min/{1.73_m2} (ref >59)
GFR calc non Af Amer: 79 mL/min/{1.73_m2} (ref >59)
Globulin, Total: 2.4 g/dL (ref 1.5–4.5)
Glucose: 96 mg/dL (ref 65–99)
Potassium: 4.1 mmol/L (ref 3.5–5.2)
Sodium: 142 mmol/L (ref 134–144)
Total Protein: 7.1 g/dL (ref 6.0–8.5)

## 2020-04-01 ENCOUNTER — Telehealth: Payer: Self-pay

## 2020-04-01 NOTE — Telephone Encounter (Signed)
Pt called and needs to change his New Patient appointment due to work. Please call pt at [phone]

## 2020-04-02 NOTE — Telephone Encounter (Signed)
Noted  

## 2020-04-02 NOTE — Telephone Encounter (Signed)
Called patient and rescheduled.

## 2020-04-07 ENCOUNTER — Ambulatory Visit: Payer: Self-pay | Admitting: Gastroenterology

## 2020-05-26 ENCOUNTER — Ambulatory Visit: Payer: Self-pay | Admitting: Gastroenterology

## 2020-07-08 ENCOUNTER — Ambulatory Visit (INDEPENDENT_AMBULATORY_CARE_PROVIDER_SITE_OTHER): Payer: Self-pay | Admitting: Gastroenterology

## 2020-07-08 ENCOUNTER — Encounter: Payer: Self-pay | Admitting: Gastroenterology

## 2020-07-08 ENCOUNTER — Encounter: Payer: Self-pay | Admitting: *Deleted

## 2020-07-08 VITALS — BP 148/106 | HR 101 | Temp 97.1°F | Ht 73.0 in | Wt 267.6 lb

## 2020-07-08 DIAGNOSIS — F1010 Alcohol abuse, uncomplicated: Secondary | ICD-10-CM

## 2020-07-08 DIAGNOSIS — R748 Abnormal levels of other serum enzymes: Secondary | ICD-10-CM

## 2020-07-08 NOTE — Patient Instructions (Addendum)
1. Please have labs and ultrasound as scheduled. We will be in touch with results as available.   Instructions for fatty liver: Recommend 1-2# weight loss per week until ideal body weight through exercise & diet. Low fat/cholesterol diet.   Avoid sweets, sodas, fruit juices, sweetened beverages like tea, etc. Gradually increase exercise from 15 min daily up to 1 hr per day 5 days/week. Limit alcohol use.   Fatty Liver Disease  Fatty liver disease occurs when too much fat has built up in your liver cells. Fatty liver disease is also called hepatic steatosis or steatohepatitis. The liver removes harmful substances from your bloodstream and produces fluids that your body needs. It also helps your body use and store energy from the food you eat. In many cases, fatty liver disease does not cause symptoms or problems. It is often diagnosed when tests are being done for other reasons. However, over time, fatty liver can cause inflammation that may lead to more serious liver problems, such as scarring of the liver (cirrhosis) and liver failure. Fatty liver is associated with insulin resistance, increased body fat, high blood pressure (hypertension), and high cholesterol. These are features of metabolic syndrome and increase your risk for stroke, diabetes, and heart disease. What are the causes? This condition may be caused by:  Drinking too much alcohol.  Poor nutrition.  Obesity.  Cushing's syndrome.  Diabetes.  High cholesterol.  Certain drugs.  Poisons.  Some viral infections.  Pregnancy. What increases the risk? You are more likely to develop this condition if you:  Abuse alcohol.  Are overweight.  Have diabetes.  Have hepatitis.  Have a high triglyceride level.  Are pregnant. What are the signs or symptoms? Fatty liver disease often does not cause symptoms. If symptoms do develop, they can include:  Fatigue.  Weakness.  Weight loss.  Confusion.  Abdominal  pain.  Nausea and vomiting.  Yellowing of your skin and the white parts of your eyes (jaundice).  Itchy skin. How is this diagnosed? This condition may be diagnosed by:  A physical exam and medical history.  Blood tests.  Imaging tests, such as an ultrasound, CT scan, or MRI.  A liver biopsy. A small sample of liver tissue is removed using a needle. The sample is then looked at under a microscope. How is this treated? Fatty liver disease is often caused by other health conditions. Treatment for fatty liver may involve medicines and lifestyle changes to manage conditions such as:  Alcoholism.  High cholesterol.  Diabetes.  Being overweight or obese. Follow these instructions at home:   Do not drink alcohol. If you have trouble quitting, ask your health care provider how to safely quit with the help of medicine or a supervised program. This is important to keep your condition from getting worse.  Eat a healthy diet as told by your health care provider. Ask your health care provider about working with a diet and nutrition specialist (dietitian) to develop an eating plan.  Exercise regularly. This can help you lose weight and control your cholesterol and diabetes. Talk to your health care provider about an exercise plan and which activities are best for you.  Take over-the-counter and prescription medicines only as told by your health care provider.  Keep all follow-up visits as told by your health care provider. This is important. Contact a health care provider if: You have trouble controlling your:  Blood sugar. This is especially important if you have diabetes.  Cholesterol.  Drinking of  alcohol. Get help right away if:  You have abdominal pain.  You have jaundice.  You have nausea and vomiting.  You vomit blood or material that looks like coffee grounds.  You have stools that are black, tar-like, or bloody. Summary  Fatty liver disease develops when too  much fat builds up in the cells of your liver.  Fatty liver disease often causes no symptoms or problems. However, over time, fatty liver can cause inflammation that may lead to more serious liver problems, such as scarring of the liver (cirrhosis).  You are more likely to develop this condition if you abuse alcohol, are pregnant, are overweight, have diabetes, have hepatitis, or have high triglyceride levels.  Contact your health care provider if you have trouble controlling your weight, blood sugar, cholesterol, or drinking of alcohol. This information is not intended to replace advice given to you by your health care provider. Make sure you discuss any questions you have with your health care provider. Document Revised: 08/25/2017 Document Reviewed: 06/21/2017 Elsevier Patient Education  2020 [employer] Inc.    Nonalcoholic Fatty Liver Disease Diet, Adult Nonalcoholic fatty liver disease is a condition that causes fat to build up in and around the liver. The disease makes it harder for the liver to work the way that it should. Following a healthy diet can help to keep nonalcoholic fatty liver disease under control. It can also help to prevent or improve conditions that are associated with the disease, such as heart disease, diabetes, high blood pressure, and abnormal cholesterol levels. Along with regular exercise, this diet:  Promotes weight loss.  Helps to control blood sugar levels.  Helps to improve the way that the body uses insulin. What are tips for following this plan? Reading food labels Always check food labels for:  The amount of saturated fat in a food. You should limit your intake of saturated fat. Saturated fat is found in foods that come from animals, including meat and dairy products such as butter, cheese, and whole milk.  The amount of fiber in a food. You should choose high-fiber foods such as fruits, vegetables, and whole grains. Try to get 25-30 grams (g) of fiber a  day.  Cooking  When cooking, use heart-healthy oils that are high in monounsaturated fats. These include olive oil, canola oil, and avocado oil.  Limit frying or deep-frying foods. Cook foods using healthy methods such as baking, boiling, steaming, and grilling instead. Meal planning  You may want to keep track of how many calories you take in. Eating the right amount of calories will help you achieve a healthy weight. Meeting with a registered dietitian can help you get started.  Limit how often you eat takeout and fast food. These foods are usually very high in fat, salt, and sugar.  Use the glycemic index (GI) to plan your meals. The index tells you how quickly a food will raise your blood sugar. Choose low-GI foods (GI less than 55). These foods take a longer time to raise blood sugar. A registered dietitian can help you identify foods lower on the GI scale. Lifestyle  You may want to follow a Mediterranean diet. This diet includes a lot of vegetables, lean meats or fish, whole grains, fruits, and healthy oils and fats. What foods can I eat?  Fruits Bananas. Apples. Oranges. Grapes. Papaya. Mango. Pomegranate. Kiwi. Grapefruit. Cherries. Vegetables Lettuce. Spinach. Peas. Beets. Cauliflower. Cabbage. Broccoli. Carrots. Tomatoes. Squash. Eggplant. Herbs. Peppers. Onions. Cucumbers. Brussels sprouts. Yams and sweet  potatoes. Beans. Lentils. Grains Whole wheat or whole-grain foods, including breads, crackers, cereals, and pasta. Stone-ground whole wheat. Unsweetened oatmeal. Bulgur. Barley. Quinoa. Brown or wild rice. Corn or whole wheat flour tortillas. Meats and other proteins Lean meats. Poultry. Tofu. Seafood and shellfish. Dairy Low-fat or fat-free dairy products, such as yogurt, cottage cheese, or cheese. Beverages Water. Sugar-free drinks. Tea. Coffee. Low-fat or skim milk. Milk alternatives, such as soy or almond milk. Real fruit juice. Fats and oils Avocado. Canola or olive  oil. Nuts and nut butters. Seeds. Seasonings and condiments Mustard. Relish. Low-fat, low-sugar ketchup and barbecue sauce. Low-fat or fat-free mayonnaise. Sweets and desserts Sugar-free sweets. The items listed above may not be a complete list of foods and beverages you can eat. Contact a dietitian for more information. What foods should I limit or avoid? Meats and other proteins Limit red meat to 1-2 times a week. Dairy [employer]. Fats and oils Palm oil and coconut oil. Fried foods. Other foods Processed foods. Foods that contain a lot of salt or sodium. Sweets and desserts Sweets that contain sugar. Beverages Sweetened drinks, such as sweet tea, milkshakes, iced sweet drinks, and sodas. Alcohol. The items listed above may not be a complete list of foods and beverages you should avoid. Contact a dietitian for more information. Where to find more information The [employer] of Diabetes and Digestive and Kidney Diseases: [email or web address] Summary  Nonalcoholic fatty liver disease is a condition that causes fat to build up in and around the liver.  Following a healthy diet can help to keep nonalcoholic fatty liver disease under control. Your diet should be rich in fruits, vegetables, whole grains, and lean proteins.  Limit your intake of saturated fat. Saturated fat is found in foods that come from animals, including meat and dairy products such as butter, cheese, and whole milk.  This diet promotes weight loss, helps to control blood sugar levels, and helps to improve the way that the body uses insulin. This information is not intended to replace advice given to you by your health care provider. Make sure you discuss any questions you have with your health care provider. Document Revised: 01/04/2019 Document Reviewed: 10/04/2018 Elsevier Patient Education  2020 [employer].

## 2020-07-08 NOTE — Progress Notes (Signed)
Primary Care Physician:  Junie Spencer, FNP  Primary Gastroenterologist:  Hennie Duos. Marletta Lor, DO   Chief Complaint  Patient presents with  . Elevated Hepatic Enzymes    HPI:  Dean Ferguson is a 34 y.o. male here at the request of Jannifer Rodney, FNP for further evaluation of abnormal LFTs. Patient has had abnormal LFTs dating back several years. Abdominal ultrasound in 2011 consistent with fatty liver.  Patient states he consumes etoh most days. Usually few mixed drinks at night for past several years. He struggles with his weight. States he got down to 218 pounds in 2019 on keto diet but has gained the weight back and now 267 pounds. He denies abdominal pain, n/v, constipation, diarrhea, melena, brbpr, heartburn.   Patient has history of elevated cholesterol with LDL of 190 one year ago. Patient is not on statin which was prescribed by PCP last year.   Component     Latest Ref Rng & Units 05/24/2018 07/12/2019 02/03/2020 03/02/2020  Albumin     4.0 - 5.0 g/dL 4.6 4.8 4.6 4.7  Globulin, Total     1.5 - 4.5 g/dL 2.7 2.4  2.4  Albumin/Globulin Ratio     1.2 - 2.2 1.7 2.0  2.0  Total Bilirubin     0.0 - 1.2 mg/dL 1.2 1.1 0.7 1.5 (H)  Alkaline Phosphatase     48 - 121 IU/L 76 84 97 87  AST     0 - 40 IU/L 125 (H) 196 (H) 79 (H) 141 (H)  ALT     0 - 44 IU/L 154 (H) 211 (H) 109 (H) 149 (H)      Current Outpatient Medications  Medication Sig Dispense Refill  . busPIRone (BUSPAR) 15 MG tablet Take 1 tablet (15 mg total) by mouth 3 (three) times daily as needed. 60 tablet 2  . lisinopril (ZESTRIL) 20 MG tablet Take 1 tablet (20 mg total) by mouth daily. 90 tablet 3  . meloxicam (MOBIC) 15 MG tablet Take 15 mg by mouth daily. As needed    . traZODone (DESYREL) 50 MG tablet Take 0.5-1 tablets (25-50 mg total) by mouth at bedtime as needed for sleep. 30 tablet 3   No current facility-administered medications for this visit.    Allergies as of 07/08/2020 - Review Complete 07/08/2020   Allergen Reaction Noted  . Skelaxin [metaxalone] Swelling and Rash 01/22/2013    Past Medical History:  Diagnosis Date  . Anxiety   . Fatty liver 07/15/2019  . GERD (gastroesophageal reflux disease)     Past Surgical History:  Procedure Laterality Date  . LEG SURGERY Right    FELL THROUGH GLASS  . SHOULDER SURGERY Right    FELL THROUGH GLASS    Family History  Problem Relation Age of Onset  . Immunodeficiency Mother   . Hyperlipidemia Father   . Hypertension Father   . Liver disease Paternal Grandfather        etoh  . Liver disease Paternal Aunt   . Colon cancer Neg Hx     Social History   Socioeconomic History  . Marital status: Married    Spouse name: Not on file  . Number of children: Not on file  . Years of education: Not on file  . Highest education level: Not on file  Occupational History  . Not on file  Tobacco Use  . Smoking status: Never Smoker  . Smokeless tobacco: Former Neurosurgeon    Types: Engineer, drilling  .  Vaping Use: Never used  Substance and Sexual Activity  . Alcohol use: Yes    Comment: 4 mixed drinks per night  . Drug use: Not Currently    Comment: no IV/intranasal drug use. weed in high school   . Sexual activity: Not on file  Other Topics Concern  . Not on file  Social History Narrative  . Not on file   Social Determinants of Health   Financial Resource Strain:   . Difficulty of Paying Living Expenses: Not on file  Food Insecurity:   . Worried About Programme researcher, broadcasting/film/video in the Last Year: Not on file  . Ran Out of Food in the Last Year: Not on file  Transportation Needs:   . Lack of Transportation (Medical): Not on file  . Lack of Transportation (Non-Medical): Not on file  Physical Activity:   . Days of Exercise per Week: Not on file  . Minutes of Exercise per Session: Not on file  Stress:   . Feeling of Stress : Not on file  Social Connections:   . Frequency of Communication with Friends and Family: Not on file  . Frequency  of Social Gatherings with Friends and Family: Not on file  . Attends Religious Services: Not on file  . Active Member of Clubs or Organizations: Not on file  . Attends Banker Meetings: Not on file  . Marital Status: Not on file  Intimate Partner Violence:   . Fear of Current or Ex-Partner: Not on file  . Emotionally Abused: Not on file  . Physically Abused: Not on file  . Sexually Abused: Not on file      ROS:  General: Negative for anorexia, weight loss, fever, chills, fatigue, weakness. Eyes: Negative for vision changes.  ENT: Negative for hoarseness, difficulty swallowing , nasal congestion. CV: Negative for chest pain, angina, palpitations, dyspnea on exertion, peripheral edema.  Respiratory: Negative for dyspnea at rest, dyspnea on exertion, cough, sputum, wheezing.  GI: See history of present illness. GU:  Negative for dysuria, hematuria, urinary incontinence, urinary frequency, nocturnal urination.  MS: Negative for joint pain, low back pain.  Derm: Negative for rash or itching.  Neuro: Negative for weakness, abnormal sensation, seizure, frequent headaches, memory loss, confusion.  Psych: Negative for anxiety, depression, suicidal ideation, hallucinations.  Endo: Negative for unusual weight change.  Heme: Negative for bruising or bleeding. Allergy: Negative for rash or hives.    Physical Examination:  BP (!) 148/106   Pulse (!) 101   Temp (!) 97.1 F (36.2 C)   Ht 6\' 1"  (1.854 m)   Wt 267 lb 9.6 oz (121.4 kg)   BMI 35.31 kg/m    General: Well-nourished, well-developed in no acute distress.  Head: Normocephalic, atraumatic.   Eyes: Conjunctiva pink, no icterus. Mouth: masked Neck: Supple without thyromegaly, masses, or lymphadenopathy.  Lungs: Clear to auscultation bilaterally.  Heart: Regular rate and rhythm, no murmurs rubs or gallops.  Abdomen: Bowel sounds are normal, nontender, nondistended, no hepatosplenomegaly or masses, no abdominal bruits  or    hernia , no rebound or guarding.   Rectal: not performed Extremities: No lower extremity edema. No clubbing or deformities.  Neuro: Alert and oriented x 4 , grossly normal neurologically.  Skin: Warm and dry, no rash or jaundice.   Psych: Alert and cooperative, normal mood and affect.  Labs: Lab Results  Component Value Date   CREATININE 1.18 03/02/2020   BUN 12 03/02/2020   NA 142 03/02/2020  K 4.1 03/02/2020   CL 102 03/02/2020   CO2 22 03/02/2020   Lab Results  Component Value Date   ALT 149 (H) 03/02/2020   AST 141 (H) 03/02/2020   ALKPHOS 87 03/02/2020   BILITOT 1.5 (H) 03/02/2020   No results found for: LIPASE Lab Results  Component Value Date   CHOL 251 (H) 07/12/2019   HDL 40 07/12/2019   LDLCALC 190 (H) 07/12/2019   TRIG 114 07/12/2019   CHOLHDL 6.3 (H) 07/12/2019   Lab Results  Component Value Date   TSH 2.290 05/24/2018     Imaging Studies: No results found.  Impression/Plan:  Pleasant 34 y/o male with hyperlipidemia (untreated), obesity, fatty liver, daily etoh use presenting for further evaluation of chronically abnormal transaminases.  Suspect elevated AST/ALT due to fatty liver in setting of obesity/etoh use/metabolic syndrome. Will screen for other possibilities with labs. Would recommend updated U/S of liver as well.   Recommend patient pursue slow gradual weight loss until goal weight, cut back on etoh use, increase daily exercise, seek management of high cholesterol from PCP.   Further recommendations pending work up.

## 2020-07-13 ENCOUNTER — Encounter: Payer: Self-pay | Admitting: Gastroenterology

## 2020-07-13 NOTE — Progress Notes (Signed)
Cc'ed to pcp °

## 2020-07-16 ENCOUNTER — Other Ambulatory Visit (HOSPITAL_COMMUNITY)
Admission: RE | Admit: 2020-07-16 | Discharge: 2020-07-16 | Disposition: A | Discharge status: Home / Self Care | Payer: 59 | Source: Ambulatory Visit | Attending: Gastroenterology | Admitting: Gastroenterology

## 2020-07-16 ENCOUNTER — Ambulatory Visit (HOSPITAL_COMMUNITY)
Admission: RE | Admit: 2020-07-16 | Discharge: 2020-07-16 | Disposition: A | Discharge status: Home / Self Care | Payer: 59 | Source: Ambulatory Visit | Attending: Gastroenterology | Admitting: Gastroenterology

## 2020-07-16 ENCOUNTER — Other Ambulatory Visit: Payer: Self-pay

## 2020-07-16 DIAGNOSIS — F1010 Alcohol abuse, uncomplicated: Secondary | ICD-10-CM | POA: Insufficient documentation

## 2020-07-16 DIAGNOSIS — R748 Abnormal levels of other serum enzymes: Secondary | ICD-10-CM | POA: Insufficient documentation

## 2020-07-16 LAB — HEPATITIS B SURFACE ANTIBODY,QUALITATIVE: Hep B S Ab: REACTIVE — AB

## 2020-07-16 LAB — IRON AND TIBC
Iron: 166 ug/dL (ref 45–182)
Saturation Ratios: 35 % (ref 17.9–39.5)
TIBC: 479 ug/dL — ABNORMAL HIGH (ref 250–450)
UIBC: 313 ug/dL

## 2020-07-16 LAB — FERRITIN: Ferritin: 174 ng/mL (ref 24–336)

## 2020-07-16 LAB — HEPATITIS B SURFACE ANTIGEN: Hepatitis B Surface Ag: NONREACTIVE

## 2020-07-16 LAB — HEPATITIS A ANTIBODY, TOTAL: hep A Total Ab: NONREACTIVE

## 2020-07-16 LAB — HEPATITIS C ANTIBODY: HCV Ab: NONREACTIVE

## 2020-07-17 LAB — MITOCHONDRIAL ANTIBODIES: Mitochondrial M2 Ab, IgG: <20 Units (ref 0.0–20.0)

## 2020-07-17 LAB — IGG, IGA, IGM
IgA: 179 mg/dL (ref 90–386)
IgG (Immunoglobin G), Serum: 1021 mg/dL (ref 603–1613)
IgM (Immunoglobulin M), Srm: 164 mg/dL (ref 20–172)

## 2020-07-17 LAB — ANTI-SMOOTH MUSCLE ANTIBODY, IGG: F-Actin IgG: 16 Units (ref 0–19)

## 2020-07-17 LAB — ANA: Anti Nuclear Antibody (ANA): NEGATIVE

## 2020-07-21 ENCOUNTER — Other Ambulatory Visit: Payer: Self-pay

## 2020-07-21 DIAGNOSIS — K760 Fatty (change of) liver, not elsewhere classified: Secondary | ICD-10-CM

## 2020-08-17 ENCOUNTER — Other Ambulatory Visit: Payer: Self-pay

## 2020-08-17 DIAGNOSIS — K760 Fatty (change of) liver, not elsewhere classified: Secondary | ICD-10-CM

## 2020-08-31 ENCOUNTER — Emergency Department (HOSPITAL_BASED_OUTPATIENT_CLINIC_OR_DEPARTMENT_OTHER): Payer: 59

## 2020-08-31 ENCOUNTER — Encounter (HOSPITAL_BASED_OUTPATIENT_CLINIC_OR_DEPARTMENT_OTHER): Payer: Self-pay

## 2020-08-31 ENCOUNTER — Emergency Department (HOSPITAL_BASED_OUTPATIENT_CLINIC_OR_DEPARTMENT_OTHER)
Admission: EM | Admit: 2020-08-31 | Discharge: 2020-08-31 | Disposition: A | Discharge status: Home / Self Care | Payer: 59 | Attending: Emergency Medicine | Admitting: Emergency Medicine

## 2020-08-31 ENCOUNTER — Other Ambulatory Visit: Payer: Self-pay

## 2020-08-31 DIAGNOSIS — R000 Tachycardia, unspecified: Secondary | ICD-10-CM | POA: Diagnosis not present

## 2020-08-31 DIAGNOSIS — E860 Dehydration: Secondary | ICD-10-CM | POA: Diagnosis not present

## 2020-08-31 DIAGNOSIS — F419 Anxiety disorder, unspecified: Secondary | ICD-10-CM | POA: Insufficient documentation

## 2020-08-31 DIAGNOSIS — R079 Chest pain, unspecified: Secondary | ICD-10-CM | POA: Diagnosis not present

## 2020-08-31 DIAGNOSIS — Z79899 Other long term (current) drug therapy: Secondary | ICD-10-CM | POA: Diagnosis not present

## 2020-08-31 DIAGNOSIS — F17220 Nicotine dependence, chewing tobacco, uncomplicated: Secondary | ICD-10-CM | POA: Diagnosis not present

## 2020-08-31 DIAGNOSIS — R030 Elevated blood-pressure reading, without diagnosis of hypertension: Secondary | ICD-10-CM | POA: Diagnosis present

## 2020-08-31 DIAGNOSIS — I10 Essential (primary) hypertension: Secondary | ICD-10-CM | POA: Diagnosis not present

## 2020-08-31 LAB — BASIC METABOLIC PANEL
Anion gap: 14 (ref 5–15)
BUN: 7 mg/dL (ref 6–20)
CO2: 25 mmol/L (ref 22–32)
Calcium: 9.6 mg/dL (ref 8.9–10.3)
Chloride: 98 mmol/L (ref 98–111)
Creatinine, Ser: 0.87 mg/dL (ref 0.61–1.24)
GFR, Estimated: >60 mL/min (ref >60)
Glucose, Bld: 106 mg/dL — ABNORMAL HIGH (ref 70–99)
Potassium: 3.8 mmol/L (ref 3.5–5.1)
Sodium: 137 mmol/L (ref 135–145)

## 2020-08-31 LAB — CBC
HCT: 45.5 % (ref 39.0–52.0)
Hemoglobin: 16 g/dL (ref 13.0–17.0)
MCH: 35.9 pg — ABNORMAL HIGH (ref 26.0–34.0)
MCHC: 35.2 g/dL (ref 30.0–36.0)
MCV: 102 fL — ABNORMAL HIGH (ref 80.0–100.0)
Platelets: 203 10*3/uL (ref 150–400)
RBC: 4.46 MIL/uL (ref 4.22–5.81)
RDW: 12.2 % (ref 11.5–15.5)
WBC: 9 10*3/uL (ref 4.0–10.5)
nRBC: 0 % (ref 0.0–0.2)

## 2020-08-31 LAB — D-DIMER, QUANTITATIVE: D-Dimer, Quant: 0.28 ug/mL-FEU (ref 0.00–0.50)

## 2020-08-31 LAB — TROPONIN I (HIGH SENSITIVITY)
Troponin I (High Sensitivity): 3 ng/L (ref <18)
Troponin I (High Sensitivity): 3 ng/L (ref <18)

## 2020-08-31 MED ORDER — LORAZEPAM 2 MG/ML IJ SOLN
1.0000 mg | Freq: Once | INTRAMUSCULAR | Status: AC
  Administered 2020-08-31: 1 mg via INTRAVENOUS
  Filled 2020-08-31: qty 1

## 2020-08-31 MED ORDER — SODIUM CHLORIDE 0.9 % IV BOLUS
1000.0000 mL | Freq: Once | INTRAVENOUS | Status: AC
  Administered 2020-08-31: 1000 mL via INTRAVENOUS

## 2020-08-31 NOTE — ED Provider Notes (Signed)
[facility] EMERGENCY DEPARTMENT Provider Note   CSN: [id number] Arrival date & time: 08/31/20  1553     History Chief Complaint  Patient presents with  . Hypertension    Dean Ferguson is a 34 y.o. male with PMHx anxiety and GERD who presents to the ED today with chief complaint of hypertension. Pt reports that earlier today while at work he felt flushed and began having sweaty palms. He states that they checked his blood pressure at work and it was 710 systolic - he takes Lisinopril 20 mg daily for BP; had forgotten to take it this morning however took it when he got home. BP in the ED 150/105.  States he went to urgent care and had an EKG done which was "abnormal" and he was sent here for further evaluation.  He states he brought his EKG with him however it was obtained in triage and ultimately thrown away.  I am able to see the urgent care note however I am unable to view the EKG.  Does report that he has had similar symptoms of this starting about 6 months ago with 3 episodes in the past 1.5 weeks.  States it feels like he is having a panic attack, has never been diagnosed with a panic attack.  He does currently take Wellbutrin as needed for anxiety.  States that he was on it daily however was not taking it as prescribed and his doctor recommended he take it only as needed.  States he has had intermittent chest pain as well, not currently having any active chest pain.  He states that he is unsure if the chest pain is related to this.  Patient denies any shortness of breath, nausea, vomiting.  He does mention that he recently flew to and from [place] however is only about a total of 3 hours.  He denies any recent surgery or immobilization for greater than 3 days.  No history of DVT/PE.  No active malignancy.  No exogenous hormone use.  No hemoptysis.  He is a never smoker.   The history is provided by the patient, the spouse and medical records.       Past Medical History:   Diagnosis Date  . Anxiety   . Fatty liver 07/15/2019  . GERD (gastroesophageal reflux disease)     Patient Active Problem List   Diagnosis Date Noted  . GAD (generalized anxiety disorder) 02/03/2020  . Controlled substance agreement signed 02/03/2020  . Morbid obesity (Combine) 02/03/2020  . Alcohol abuse 02/03/2020  . Elevated liver enzymes 02/03/2020  . Skin tag 10/15/2019  . Fatty liver 07/15/2019  . HLA B27 (HLA B27 positive) 12/28/2018  . Iritis of left eye 12/28/2018  . Chronic back pain 12/28/2018  . Obstructive sleep apnea treated with continuous positive airway pressure (CPAP) 12/11/2018  . Severe obstructive sleep apnea-hypopnea syndrome 10/05/2018  . Essential hypertension 10/01/2018  . OSA (obstructive sleep apnea) 07/23/2018  . Psychophysiological insomnia 07/23/2018  . Depression, major, single episode, mild ([place]) 06/24/2018  . Gout 05/28/2018  . Snoring 05/24/2018  . Fatigue 05/24/2018    Past Surgical History:  Procedure Laterality Date  . LEG SURGERY Right    FELL THROUGH GLASS  . SHOULDER SURGERY Right    FELL THROUGH GLASS       Family History  Problem Relation Age of Onset  . Immunodeficiency Mother   . Hyperlipidemia Father   . Hypertension Father   . Liver disease Paternal Grandfather  etoh  . Liver disease Paternal Aunt   . Colon cancer Neg Hx     Social History   Tobacco Use  . Smoking status: Never Smoker  . Smokeless tobacco: Former Systems developer    Types: Secondary school teacher  . Vaping Use: Never used  Substance Use Topics  . Alcohol use: Yes    Comment: 3-4 liquor daily  . Drug use: Not Currently    Home Medications Prior to Admission medications   Medication Sig Start Date End Date Taking? Authorizing Provider  busPIRone (BUSPAR) 15 MG tablet Take 1 tablet (15 mg total) by mouth 3 (three) times daily as needed. 02/03/20   Evelina Dun A, FNP  lisinopril (ZESTRIL) 20 MG tablet Take 1 tablet (20 mg total) by mouth daily. 02/03/20    Evelina Dun A, FNP  meloxicam (MOBIC) 15 MG tablet Take 15 mg by mouth daily. As needed 08/27/19   [provider]  traZODone (DESYREL) 50 MG tablet Take 0.5-1 tablets (25-50 mg total) by mouth at bedtime as needed for sleep. 07/12/19   Terald Sleeper, PA-C  ULTRAM 50 MG tablet Take 50-100 mg by mouth every 6 (six) hours. 04/16/20   [provider]    Allergies    Skelaxin [metaxalone]  Review of Systems   Review of Systems  Constitutional: Positive for diaphoresis. Negative for chills and fever.  Respiratory: Negative for cough and shortness of breath.   Cardiovascular: Positive for chest pain.  Neurological: Positive for light-headedness.  Psychiatric/Behavioral: The patient is nervous/anxious.   All other systems reviewed and are negative.   Physical Exam Updated Vital Signs BP (!) 150/105 (BP Location: Left Arm)   Pulse (!) 110   Temp 99.5 F (37.5 C) (Oral)   Resp 18   SpO2 100%   Physical Exam Vitals and nursing note reviewed.  Constitutional:      Appearance: He is obese. He is not ill-appearing or diaphoretic.  HENT:     Head: Normocephalic and atraumatic.  Eyes:     Extraocular Movements: Extraocular movements intact.     Conjunctiva/sclera: Conjunctivae normal.     Pupils: Pupils are equal, round, and reactive to light.     Comments: No nystagmus  Cardiovascular:     Rate and Rhythm: Regular rhythm. Tachycardia present.     Pulses: Normal pulses.  Pulmonary:     Effort: Pulmonary effort is normal.     Breath sounds: Normal breath sounds. No wheezing, rhonchi or rales.  Abdominal:     Palpations: Abdomen is soft.     Tenderness: There is no abdominal tenderness. There is no guarding or rebound.  Musculoskeletal:     Cervical back: Neck supple.  Skin:    General: Skin is warm and dry.  Neurological:     Mental Status: He is alert.     Comments: Alert and oriented to self, place, time and event.   Speech is fluent, clear without  dysarthria or dysphasia.   Strength 5/5 in upper/lower extremities  Sensation intact in upper/lower extremities   Normal gait.  Negative Romberg. No pronator drift.  Normal finger-to-nose and feet tapping.  CN I not tested  CN II grossly intact visual fields bilaterally. Did not visualize posterior eye.   CN III, IV, VI PERRLA and EOMs intact bilaterally  CN V Intact sensation to sharp and light touch to the face  CN VII facial movements symmetric  CN VIII not tested  CN IX, X no uvula deviation, symmetric  rise of soft palate  CN XI 5/5 SCM and trapezius strength bilaterally  CN XII Midline tongue protrusion, symmetric L/R movements      ED Results / Procedures / Treatments   Labs (all labs ordered are listed, but only abnormal results are displayed) Labs Reviewed  BASIC METABOLIC PANEL - Abnormal; Notable for the following components:      Result Value   Glucose, Bld 106 (*)    All other components within normal limits  CBC - Abnormal; Notable for the following components:   MCV 102.0 (*)    MCH 35.9 (*)    All other components within normal limits  D-DIMER, QUANTITATIVE (NOT AT [facility])  TROPONIN I (HIGH SENSITIVITY)  TROPONIN I (HIGH SENSITIVITY)    EKG EKG Interpretation  Date/Time:  Monday August 31 2020 15:58:17 EST Ventricular Rate:  109 PR Interval:  164 QRS Duration: 84 QT Interval:  306 QTC Calculation: 412 R Axis:   80 Text Interpretation: Sinus tachycardia Otherwise normal ECG Confirmed by Dewaine Conger [phone]) on 08/31/2020 6:41:46 PM   Radiology DG Chest 2 View  Result Date: 08/31/2020 CLINICAL DATA:  Chest pain. EXAM: CHEST - 2 VIEW COMPARISON:  None. FINDINGS: The heart size and mediastinal contours are within normal limits. Both lungs are clear. No visible pleural effusions or pneumothorax. No acute osseous abnormality. IMPRESSION: No active cardiopulmonary disease. Electronically Signed   By: Margaretha Sheffield MD   On: 08/31/2020 16:30     Procedures Procedures (including critical care time)  Medications Ordered in ED Medications  sodium chloride 0.9 % bolus 1,000 mL ( Intravenous Stopped 08/31/20 2008)  LORazepam (ATIVAN) injection 1 mg (1 mg Intravenous Given 08/31/20 2108)    ED Course  I have reviewed the triage vital signs and the nursing notes.  Pertinent labs & imaging results that were available during my care of the patient were reviewed by me and considered in my medical decision making (see chart for details).  Clinical Course as of Aug 31 2237  Mon Aug 31, 2020  1856 D-Dimer, Quant: 0.28 [MV]    Clinical Course User Index [MV] Eustaquio Maize, PA-C   MDM Rules/Calculators/A&P                          34 year old male presents to the ED today with complaint of sudden onset lightheadedness, diaphoresis, chest pain that occurred while he was at work with a noted elevated blood pressure at 503 systolic however patient did forget to take his blood pressure medication today.  He went to urgent care for his symptoms and was told he had an abnormal EKG he was sent here for further evaluation.  On arrival to the ED patient's temperature is 99.5, recheck 98.6.  He is tachycardic in the 110s, nontachypneic.  Satting 100% on room air.  Blood pressure elevated at 150/105 repeat 140/98.  He did take his 20 mg lisinopril after work prior to coming to the ED today.  States he brought his EKG with him today however it seems to have been lost in triage.  An EKG was obtained here which does show sinus tachycardia, no other acute ischemic changes.  A chest x-ray was obtained while patient was in the waiting room which is negative.  Initial troponin of 3.  Remainder of labs unremarkable including a CBC without leukocytosis and a stable hemoglobin of 16.0.  BMP without electrolyte abnormalities and a glucose of 106.  On exam patient has  no focal neuro deficits.  He is noted to be tachycardic.  Difficult to say what he is describing,  he states he feels like he was having a panic attack however has not been diagnosed with a panic attack.  He has had 3 of these episodes in the past 1.5 weeks.  Given his tachycardia will obtain orthostatics to assess for dehydration.  He is very vague about his chest pain and therefore will obtain a D-dimer to assess for PE.  If D-dimer, repeat troponin, orthostatics within normal limits will provide fluids given tachycardia and reassess.   Orthostatics positive; will provide fluids today D dimer negative at 0.28 Repeat troponin unchanged at 3   On reevaluation pt resting comfortably and heart rate has decreased into the 90s however when I enter the room pt's heart rate increases to the 100's.  I have encouraged patient to increase his oral intake and to follow-up with his pediatrician regarding his symptoms today.  He does report that he used to be on Buspar every day however is currently taking it as needed, I do suspect that his symptoms may be related to anxiety.  When I bring this patient is tearful and seems more anxious.  His wife is at bedside consoling him.  They do report that they have been in quite a lot of stress recently as they are currently trying to move and sell their house in finding home at the same time.  Have provided a 1 mg IV Ativan for patient with improvement in his symptoms.  He was monitored afterwards and states he feels better.  Will discharge home at this time.  Patient is agreement with plan is stable for discharge.   This note was prepared using Dragon voice recognition software and may include unintentional dictation errors due to the inherent limitations of voice recognition software.   Final Clinical Impression(s) / ED Diagnoses Final diagnoses:  Dehydration  Anxiety  Nonspecific chest pain    Rx / DC Orders ED Discharge Orders    None       Discharge Instructions     Please follow up with your PCP regarding your ED visit today. Discuss medication  management of your anxiety.  Increase your daily water intake as it appears you were dehydrated today  Return to the ED for any worsening symptoms         Eustaquio Maize, PA-C 08/31/20 2238    Breck Coons, MD 09/01/20 1501

## 2020-08-31 NOTE — ED Notes (Signed)
ED Provider at bedside. 

## 2020-08-31 NOTE — ED Triage Notes (Signed)
Pt c/o elevated BP today-"feeling weak and nervous almost like a panic attack"-c/o "clammy hands" and HA-NAD-steady gait-added that he had CP earlier-denies at present

## 2020-08-31 NOTE — Discharge Instructions (Addendum)
Please follow up with your PCP regarding your ED visit today. Discuss medication management of your anxiety.  Increase your daily water intake as it appears you were dehydrated today  Return to the ED for any worsening symptoms

## 2020-09-01 ENCOUNTER — Ambulatory Visit (INDEPENDENT_AMBULATORY_CARE_PROVIDER_SITE_OTHER): Payer: 59 | Admitting: Family

## 2020-09-01 ENCOUNTER — Encounter: Payer: Self-pay | Admitting: Family

## 2020-09-01 ENCOUNTER — Other Ambulatory Visit: Payer: Self-pay

## 2020-09-01 VITALS — BP 128/84 | HR 93 | Temp 98.2°F | Ht 73.0 in | Wt 260.4 lb

## 2020-09-01 DIAGNOSIS — F1010 Alcohol abuse, uncomplicated: Secondary | ICD-10-CM

## 2020-09-01 DIAGNOSIS — I10 Essential (primary) hypertension: Secondary | ICD-10-CM

## 2020-09-01 DIAGNOSIS — F411 Generalized anxiety disorder: Secondary | ICD-10-CM | POA: Diagnosis not present

## 2020-09-01 DIAGNOSIS — Z09 Encounter for follow-up examination after completed treatment for conditions other than malignant neoplasm: Secondary | ICD-10-CM

## 2020-09-01 DIAGNOSIS — F320 Major depressive disorder, single episode, mild: Secondary | ICD-10-CM

## 2020-09-01 DIAGNOSIS — F5104 Psychophysiologic insomnia: Secondary | ICD-10-CM

## 2020-09-01 DIAGNOSIS — G4733 Obstructive sleep apnea (adult) (pediatric): Secondary | ICD-10-CM

## 2020-09-01 DIAGNOSIS — Z0001 Encounter for general adult medical examination with abnormal findings: Secondary | ICD-10-CM | POA: Diagnosis not present

## 2020-09-01 DIAGNOSIS — K760 Fatty (change of) liver, not elsewhere classified: Secondary | ICD-10-CM

## 2020-09-01 DIAGNOSIS — Z114 Encounter for screening for human immunodeficiency virus [HIV]: Secondary | ICD-10-CM

## 2020-09-01 DIAGNOSIS — Z0000 Encounter for general adult medical examination without abnormal findings: Secondary | ICD-10-CM

## 2020-09-01 MED ORDER — ESCITALOPRAM OXALATE 5 MG PO TABS: ORAL_TABLET | ORAL | 0 refills | Status: DC

## 2020-09-01 MED ORDER — ESCITALOPRAM OXALATE 10 MG PO TABS: 10.0000 mg | ORAL_TABLET | Freq: Every day | ORAL | 3 refills | Status: DC

## 2020-09-01 MED ORDER — BUSPIRONE HCL 7.5 MG PO TABS: 7.5000 mg | ORAL_TABLET | Freq: Three times a day (TID) | ORAL | 1 refills | Status: DC | PRN

## 2020-09-01 NOTE — Patient Instructions (Signed)

## 2020-09-01 NOTE — Addendum Note (Signed)
Addended by: Jannifer Rodney A on: 09/01/2020 03:47 PM   Modules accepted: Orders

## 2020-09-01 NOTE — Progress Notes (Signed)
Subjective:    Dean Ferguson ID: Dean Ferguson, male    DOB: Feb 11, 1986, 34 y.o.   MRN: [id number]  Chief Complaint  Dean Ferguson presents with  . Hospitalization Follow-up    ER follow up and med refills    Dean Ferguson presents to the office today for CPE and hospital follow up. Dean Ferguson has OSA and uses CPAP "most nights".   Dean Ferguson Ferguson drinking at least 8 oz of Bourbon 3-4 times a week. Dean Ferguson does have elevated liver enzymes. Dean Ferguson followed by GI and had US abdomen on 07/16/20 that showed fatty liver.   Dean Ferguson went to the ED yesterday with anxiety, elevated BP Anxiety Presents for follow-up visit. Symptoms include depressed mood, excessive worry, insomnia, irritability, muscle tension, nervous/anxious behavior, panic and restlessness. Dean Ferguson no shortness of breath. Symptoms occur most days. The severity of symptoms Ferguson moderate.    Hypertension This Ferguson a chronic problem. The current episode started more than 1 year ago. The problem has been resolved since onset. The problem Ferguson controlled. Associated symptoms include anxiety and malaise/fatigue. Pertinent negatives include no peripheral edema or shortness of breath. The current treatment provides mild improvement.      Review of Systems  Constitutional: Positive for irritability and malaise/fatigue.  Respiratory: Negative for shortness of breath.   Psychiatric/Behavioral: The Dean Ferguson Ferguson nervous/anxious and has insomnia.   All other systems reviewed and are negative.      Objective:   Physical Exam Vitals reviewed.  Constitutional:      General: Dean Ferguson not in acute distress.    Appearance: Dean Ferguson well-developed.  HENT:     Head: Normocephalic.     Right Ear: Tympanic membrane normal.     Left Ear: Tympanic membrane normal.  Eyes:     General:        Right eye: No discharge.        Left eye: No discharge.     Pupils: Pupils are equal, round, and reactive to light.  Neck:     Thyroid: No thyromegaly.  Cardiovascular:     Rate and Rhythm:  Regular rhythm. Tachycardia present.     Heart sounds: Normal heart sounds. No murmur heard.   Pulmonary:     Effort: Pulmonary effort Ferguson normal. No respiratory distress.     Breath sounds: Normal breath sounds. No wheezing.  Abdominal:     General: Bowel sounds are normal. There Ferguson no distension.     Palpations: Abdomen Ferguson soft.     Tenderness: There Ferguson no abdominal tenderness.  Musculoskeletal:        General: No tenderness. Normal range of motion.     Cervical back: Normal range of motion and neck supple.  Skin:    General: Skin Ferguson warm and dry.     Findings: No erythema or rash.  Neurological:     Mental Status: Dean Ferguson alert and oriented to person, place, and time.     Cranial Nerves: No cranial nerve deficit.     Deep Tendon Reflexes: Reflexes are normal and symmetric.  Psychiatric:        Behavior: Behavior normal.        Thought Content: Thought content normal.        Judgment: Judgment normal.     BP 128/84   Pulse 93   Temp 98.2 F (36.8 C) (Temporal)   Ht 6\' 1"  (1.854 m)   Wt 260 lb 6.4 oz (118.1 kg)   SpO2 98%   BMI 34.36 kg/m  Assessment & Plan:  Dean Ferguson comes in today with chief complaint of Hospitalization Follow-up (ER follow up and med refills )   Diagnosis and orders addressed:  1. Annual physical exam - Anemia Profile B - Hepatic function panel - TSH - VITAMIN D 25 Hydroxy (Vit-D Deficiency, Fractures) - Lipid panel  2. Hospital discharge follow-up  3. Essential hypertension  4. OSA (obstructive sleep apnea)  5. Fatty liver - Hepatic function panel  6. Alcohol abuse - Hepatic function panel  7. Depression, major, single episode, mild (HCC) Will start Lexapro 5 mg today for 2 weeks then increase to 10 mg  - escitalopram (LEXAPRO) 5 MG tablet; Take 1 tablet (5 mg total) by mouth daily for 14 days, THEN 2 tablets (10 mg total) daily for 14 days.  Dispense: 42 tablet; Refill: 0 - busPIRone (BUSPAR) 7.5 MG tablet; Take 1 tablet  (7.5 mg total) by mouth 3 (three) times daily as needed.  Dispense: 90 tablet; Refill: 1 - escitalopram (LEXAPRO) 10 MG tablet; Take 1 tablet (10 mg total) by mouth daily.  Dispense: 90 tablet; Refill: 3  8. GAD (generalized anxiety disorder) - escitalopram (LEXAPRO) 5 MG tablet; Take 1 tablet (5 mg total) by mouth daily for 14 days, THEN 2 tablets (10 mg total) daily for 14 days.  Dispense: 42 tablet; Refill: 0 - busPIRone (BUSPAR) 7.5 MG tablet; Take 1 tablet (7.5 mg total) by mouth 3 (three) times daily as needed.  Dispense: 90 tablet; Refill: 1 - escitalopram (LEXAPRO) 10 MG tablet; Take 1 tablet (10 mg total) by mouth daily.  Dispense: 90 tablet; Refill: 3  9. Morbid obesity (HCC  10. Psychophysiological insomnia   Labs pending Long discussion with Dean Ferguson GAD and Panic attacs Health Maintenance reviewed Diet and exercise encouraged  Follow up plan: 6-8 weeks to recheck Depression and GAD   Jannifer Rodney, FNP

## 2020-09-02 ENCOUNTER — Other Ambulatory Visit: Payer: Self-pay | Admitting: Family

## 2020-09-02 LAB — HEPATIC FUNCTION PANEL
ALT: 133 IU/L — ABNORMAL HIGH (ref 0–44)
AST: 102 IU/L — ABNORMAL HIGH (ref 0–40)
Albumin: 4.3 g/dL (ref 4.0–5.0)
Alkaline Phosphatase: 76 IU/L (ref 44–121)
Bilirubin Total: 1.1 mg/dL (ref 0.0–1.2)
Bilirubin, Direct: 0.36 mg/dL (ref 0.00–0.40)
Total Protein: 6.9 g/dL (ref 6.0–8.5)

## 2020-09-02 LAB — ANEMIA PROFILE B
Basophils Absolute: 0 10*3/uL (ref 0.0–0.2)
Basos: 1 %
EOS (ABSOLUTE): 0 10*3/uL (ref 0.0–0.4)
Eos: 1 %
Ferritin: 439 ng/mL — ABNORMAL HIGH (ref 30–400)
Folate: 3.4 ng/mL (ref >3.0)
Hematocrit: 45 % (ref 37.5–51.0)
Hemoglobin: 15.9 g/dL (ref 13.0–17.7)
Immature Grans (Abs): 0 10*3/uL (ref 0.0–0.1)
Immature Granulocytes: 0 %
Iron Saturation: 32 % (ref 15–55)
Iron: 116 ug/dL (ref 38–169)
Lymphocytes Absolute: 1.5 10*3/uL (ref 0.7–3.1)
Lymphs: 23 %
MCH: 36.1 pg — ABNORMAL HIGH (ref 26.6–33.0)
MCHC: 35.3 g/dL (ref 31.5–35.7)
MCV: 102 fL — ABNORMAL HIGH (ref 79–97)
Monocytes Absolute: 0.6 10*3/uL (ref 0.1–0.9)
Monocytes: 9 %
Neutrophils Absolute: 4.4 10*3/uL (ref 1.4–7.0)
Neutrophils: 66 %
Platelets: 197 10*3/uL (ref 150–450)
RBC: 4.41 x10E6/uL (ref 4.14–5.80)
RDW: 12.5 % (ref 11.6–15.4)
Retic Ct Pct: 2.1 % (ref 0.6–2.6)
Total Iron Binding Capacity: 357 ug/dL (ref 250–450)
UIBC: 241 ug/dL (ref 111–343)
Vitamin B-12: 266 pg/mL (ref 232–1245)
WBC: 6.6 10*3/uL (ref 3.4–10.8)

## 2020-09-02 LAB — LIPID PANEL
Chol/HDL Ratio: 4.6 ratio (ref 0.0–5.0)
Cholesterol, Total: 209 mg/dL — ABNORMAL HIGH (ref 100–199)
HDL: 45 mg/dL (ref >39)
LDL Chol Calc (NIH): 142 mg/dL — ABNORMAL HIGH (ref 0–99)
Triglycerides: 122 mg/dL (ref 0–149)
VLDL Cholesterol Cal: 22 mg/dL (ref 5–40)

## 2020-09-02 LAB — TSH: TSH: 2.9 u[IU]/mL (ref 0.450–4.500)

## 2020-09-02 LAB — VITAMIN D 25 HYDROXY (VIT D DEFICIENCY, FRACTURES): Vit D, 25-Hydroxy: 29.8 ng/mL — ABNORMAL LOW (ref 30.0–100.0)

## 2020-09-02 LAB — HIV ANTIBODY (ROUTINE TESTING W REFLEX): HIV Screen 4th Generation wRfx: NONREACTIVE

## 2020-09-02 MED ORDER — VITAMIN D (ERGOCALCIFEROL) 1.25 MG (50000 UNIT) PO CAPS: 50000.0000 [IU] | ORAL_CAPSULE | ORAL | 3 refills | Status: DC

## 2020-10-07 ENCOUNTER — Encounter: Payer: Self-pay | Admitting: Family

## 2020-10-07 ENCOUNTER — Ambulatory Visit (INDEPENDENT_AMBULATORY_CARE_PROVIDER_SITE_OTHER): Payer: 59 | Admitting: Family

## 2020-10-07 ENCOUNTER — Other Ambulatory Visit: Payer: 59

## 2020-10-07 DIAGNOSIS — J029 Acute pharyngitis, unspecified: Secondary | ICD-10-CM | POA: Diagnosis not present

## 2020-10-07 DIAGNOSIS — U071 COVID-19: Secondary | ICD-10-CM | POA: Diagnosis not present

## 2020-10-07 LAB — CULTURE, GROUP A STREP

## 2020-10-07 LAB — VERITOR FLU A/B WAIVED
Influenza A: NEGATIVE
Influenza B: NEGATIVE

## 2020-10-07 LAB — RAPID STREP SCREEN (MED CTR MEBANE ONLY): Strep Gp A Ag, IA W/Reflex: NEGATIVE

## 2020-10-07 MED ORDER — PROMETHAZINE-DM 6.25-15 MG/5ML PO SYRP: 5.0000 mL | ORAL_SOLUTION | Freq: Three times a day (TID) | ORAL | 0 refills | Status: DC | PRN

## 2020-10-07 MED ORDER — DEXAMETHASONE 6 MG PO TABS: 6.0000 mg | ORAL_TABLET | Freq: Two times a day (BID) | ORAL | 0 refills | Status: DC

## 2020-10-07 NOTE — Progress Notes (Signed)
Virtual Visit via telephone Note Due to COVID-19 pandemic this visit was conducted virtually. This visit type was conducted due to national recommendations for restrictions regarding the COVID-19 Pandemic (e.g. social distancing, sheltering in place) in an effort to limit this patient's exposure and mitigate transmission in our community. All issues noted in this document were discussed and addressed.  A physical exam was not performed with this format.  I connected with Dean Ferguson on 10/07/20 at 3:03 pm  by telephone and verified that I am speaking with the correct person using two identifiers. Dean Ferguson is currently located at home  and no one is currently with him  during visit. The provider, Jannifer Rodney, FNP is located in their office at time of visit.  I discussed the limitations, risks, security and privacy concerns of performing an evaluation and management service by telephone and the availability of in person appointments. I also discussed with the patient that there may be a patient responsible charge related to this service. The patient expressed understanding and agreed to proceed.   History and Present Illness:  Pt calls the office today cough that started Monday. He took at home COVID test and it was positive Cough This is a new problem. The current episode started in the past 7 days. The problem has been gradually worsening. The problem occurs every few minutes. The cough is productive of sputum. Associated symptoms include ear congestion, headaches, myalgias, nasal congestion, postnasal drip, rhinorrhea and a sore throat. Pertinent negatives include no chills, ear pain, fever, shortness of breath or wheezing. Associated symptoms comments: Sinus pressure . The symptoms are aggravated by lying down. He has tried rest and OTC cough suppressant for the symptoms. The treatment provided mild relief.    .   Review of Systems  Constitutional: Negative for chills and fever.   HENT: Positive for postnasal drip, rhinorrhea and sore throat. Negative for ear pain.   Respiratory: Positive for cough. Negative for shortness of breath and wheezing.   Musculoskeletal: Positive for myalgias.  Neurological: Positive for headaches.     Observations/Objective: No SOB or distress, intermittent cough  Assessment and Plan: 1. COVID-19 virus detected Rest Force fluids Tylenol  Test pending  Quarantine until results  - Veritor Flu A/B Waived - Rapid Strep Screen ([facility] ONLY) - Novel Coronavirus, NAA ([facility]) - dexamethasone (DECADRON) 6 MG tablet; Take 1 tablet (6 mg total) by mouth 2 (two) times daily with a meal.  Dispense: 14 tablet; Refill: 0 - promethazine-dextromethorphan (PROMETHAZINE-DM) 6.25-15 MG/5ML syrup; Take 5 mLs by mouth 3 (three) times daily as needed for cough.  Dispense: 118 mL; Refill: 0  2. Sore throat - Veritor Flu A/B Waived - Rapid Strep Screen ([facility] ONLY) - Novel Coronavirus, NAA ([facility]) - dexamethasone (DECADRON) 6 MG tablet; Take 1 tablet (6 mg total) by mouth 2 (two) times daily with a meal.  Dispense: 14 tablet; Refill: 0 - promethazine-dextromethorphan (PROMETHAZINE-DM) 6.25-15 MG/5ML syrup; Take 5 mLs by mouth 3 (three) times daily as needed for cough.  Dispense: 118 mL; Refill: 0  3. Morbid obesity (HCC)     I discussed the assessment and treatment plan with the patient. The patient was provided an opportunity to ask questions and all were answered. The patient agreed with the plan and demonstrated an understanding of the instructions.   The patient was advised to call back or seek an in-person evaluation if the symptoms worsen or if the condition fails to improve as anticipated.  The above assessment and management plan was discussed with the patient. The patient verbalized understanding of and has agreed to the management plan. Patient is aware to call the clinic if symptoms persist or worsen. Patient is aware  when to return to the clinic for a follow-up visit. Patient educated on when it is appropriate to go to the emergency department.   Time call ended:  3:14 pm   I provided 11 minutes of non-face-to-face time during this encounter.    Jannifer Rodney, FNP

## 2020-10-08 LAB — SARS-COV-2, NAA 2 DAY TAT

## 2020-10-08 LAB — NOVEL CORONAVIRUS, NAA: SARS-CoV-2, NAA: DETECTED — AB

## 2020-10-09 ENCOUNTER — Telehealth: Payer: Self-pay

## 2020-10-09 NOTE — Telephone Encounter (Signed)
Called to discuss with patient about COVID-19 symptoms and the use of one of the available treatments for those with mild to moderate Covid symptoms and at a high risk of hospitalization.  Pt appears to qualify for outpatient treatment due to co-morbid conditions and/or a member of an at-risk group in accordance with the FDA Emergency Use Authorization.    Symptom onset: 10/05/20 Vaccinated: Yes Booster? No Immunocompromised? No Qualifiers: HTN  Unable to reach pt - Reached pt. States he is feeling better and does need any further treatment.   Dean Ferguson

## 2020-10-13 ENCOUNTER — Encounter: Payer: Self-pay | Admitting: Family

## 2020-10-13 ENCOUNTER — Ambulatory Visit (INDEPENDENT_AMBULATORY_CARE_PROVIDER_SITE_OTHER): Payer: 59 | Admitting: Family

## 2020-10-13 DIAGNOSIS — F320 Major depressive disorder, single episode, mild: Secondary | ICD-10-CM

## 2020-10-13 DIAGNOSIS — F411 Generalized anxiety disorder: Secondary | ICD-10-CM | POA: Diagnosis not present

## 2020-10-13 MED ORDER — ESCITALOPRAM OXALATE 20 MG PO TABS: 20.0000 mg | ORAL_TABLET | Freq: Every day | ORAL | 2 refills | Status: DC

## 2020-10-13 NOTE — Progress Notes (Signed)
Virtual Visit via telephone Note Due to COVID-19 pandemic this visit was conducted virtually. This visit type was conducted due to national recommendations for restrictions regarding the COVID-19 Pandemic (e.g. social distancing, sheltering in place) in an effort to limit this patient's exposure and mitigate transmission in our community. All issues noted in this document were discussed and addressed.  A physical exam was not performed with this format.  I connected with Dean Ferguson on 10/13/20 at 8:18 AM  by telephone and verified that I am speaking with the correct person using two identifiers. Dean Ferguson is currently located at home and son is currently with him  during visit. The provider, Jannifer Rodney, FNP is located in their office at time of visit.  I discussed the limitations, risks, security and privacy concerns of performing an evaluation and management service by telephone and the availability of in person appointments. I also discussed with the patient that there may be a patient responsible charge related to this service. The patient expressed understanding and agreed to proceed.   History and Present Illness:  Pt calls the office today for follow up on GAD and Depression. He was seen on 09/01/20 and Lexapro 5 mg for 2 weeks then increased to Lexapro 10 mg. He reports this has helped his anxiety. He has had one small panic attack. He reports he has "felt" some coming, but has been able to to "talk myself out of it".  Anxiety Presents for follow-up visit. Symptoms include depressed mood, excessive worry, irritability, nervous/anxious behavior and restlessness. Symptoms occur occasionally. The severity of symptoms is moderate.    Depression        This is a chronic problem.  The current episode started more than 1 year ago.   The onset quality is gradual.   The problem occurs intermittently.  Associated symptoms include irritable, restlessness and sad.  Associated symptoms  include no helplessness and no hopelessness.  Past treatments include SSRIs - Selective serotonin reuptake inhibitors.  Past medical history includes anxiety.       Review of Systems  Constitutional: Positive for irritability.  Psychiatric/Behavioral: Positive for depression. The patient is nervous/anxious.   All other systems reviewed and are negative.    Observations/Objective: No SOB Or distress noted   Assessment and Plan: 1. Depression, major, single episode, mild (HCC) - escitalopram (LEXAPRO) 20 MG tablet; Take 1 tablet (20 mg total) by mouth daily.  Dispense: 90 tablet; Refill: 2  2. GAD (generalized anxiety disorder) - escitalopram (LEXAPRO) 20 MG tablet; Take 1 tablet (20 mg total) by mouth daily.  Dispense: 90 tablet; Refill: 2  3. Morbid obesity (HCC)   Will increase Lexapro to 20 mg from 10 mg  Stress management Discuss possible adverse effects  RTO in 6 weeks to recheck GAD and Depression     I discussed the assessment and treatment plan with the patient. The patient was provided an opportunity to ask questions and all were answered. The patient agreed with the plan and demonstrated an understanding of the instructions.   The patient was advised to call back or seek an in-person evaluation if the symptoms worsen or if the condition fails to improve as anticipated.  The above assessment and management plan was discussed with the patient. The patient verbalized understanding of and has agreed to the management plan. Patient is aware to call the clinic if symptoms persist or worsen. Patient is aware when to return to the clinic for a follow-up visit. Patient educated on  when it is appropriate to go to the emergency department.   Time call ended:  8:31 AM   I provided 13 minutes of non-face-to-face time during this encounter.    Jannifer Rodney, FNP

## 2021-02-05 ENCOUNTER — Encounter: Payer: Self-pay | Admitting: Family

## 2021-02-05 ENCOUNTER — Ambulatory Visit (INDEPENDENT_AMBULATORY_CARE_PROVIDER_SITE_OTHER): Payer: 59 | Admitting: Family

## 2021-02-05 DIAGNOSIS — F40243 Fear of flying: Secondary | ICD-10-CM | POA: Diagnosis not present

## 2021-02-05 DIAGNOSIS — F411 Generalized anxiety disorder: Secondary | ICD-10-CM | POA: Diagnosis not present

## 2021-02-05 MED ORDER — ALPRAZOLAM 0.25 MG PO TABS: 0.2500 mg | ORAL_TABLET | Freq: Every evening | ORAL | 0 refills | Status: DC | PRN

## 2021-02-05 NOTE — Patient Instructions (Signed)
[email or web address]">  Generalized Anxiety Disorder, Adult Generalized anxiety disorder (GAD) is a mental health condition. Unlike normal worries, anxiety related to GAD is not triggered by a specific event. These worries do not fade or get better with time. GAD interferes with relationships, work, and school. GAD symptoms can vary from mild to severe. People with severe GAD can have intense waves of anxiety with physical symptoms that are similar to panic attacks. What are the causes? The exact cause of GAD is not known, but the following are believed to have an impact:  Differences in natural brain chemicals.  Genes passed down from parents to children.  Differences in the way threats are perceived.  Development during childhood.  Personality. What increases the risk? The following factors may make you more likely to develop this condition:  Being male.  Having a family history of anxiety disorders.  Being very shy.  Experiencing very stressful life events, such as the death of a loved one.  Having a very stressful family environment. What are the signs or symptoms? People with GAD often worry excessively about many things in their lives, such as their health and family. Symptoms may also include:  Mental and emotional symptoms: ? Worrying excessively about natural disasters. ? Fear of being late. ? Difficulty concentrating. ? Fears that others are judging your performance.  Physical symptoms: ? Fatigue. ? Headaches, muscle tension, muscle twitches, trembling, or feeling shaky. ? Feeling like your heart is pounding or beating very fast. ? Feeling out of breath or like you cannot take a deep breath. ? Having trouble falling asleep or staying asleep, or experiencing restlessness. ? Sweating. ? Nausea, diarrhea, or irritable bowel syndrome (IBS).  Behavioral symptoms: ? Experiencing erratic moods or irritability. ? Avoidance of new situations. ? Avoidance of  people. ? Extreme difficulty making decisions. How is this diagnosed? This condition is diagnosed based on your symptoms and medical history. You will also have a physical exam. Your health care provider may perform tests to rule out other possible causes of your symptoms. To be diagnosed with GAD, a person must have anxiety that:  Is out of his or her control.  Affects several different aspects of his or her life, such as work and relationships.  Causes distress that makes him or her unable to take part in normal activities.  Includes at least three symptoms of GAD, such as restlessness, fatigue, trouble concentrating, irritability, muscle tension, or sleep problems. Before your health care provider can confirm a diagnosis of GAD, these symptoms must be present more days than they are not, and they must last for 6 months or longer. How is this treated? This condition may be treated with:  Medicine. Antidepressant medicine is usually prescribed for long-term daily control. Anti-anxiety medicines may be added in severe cases, especially when panic attacks occur.  Talk therapy (psychotherapy). Certain types of talk therapy can be helpful in treating GAD by providing support, education, and guidance. Options include: ? Cognitive behavioral therapy (CBT). People learn coping skills and self-calming techniques to ease their physical symptoms. They learn to identify unrealistic thoughts and behaviors and to replace them with more appropriate thoughts and behaviors. ? Acceptance and commitment therapy (ACT). This treatment teaches people how to be mindful as a way to cope with unwanted thoughts and feelings. ? Biofeedback. This process trains you to manage your body's response (physiological response) through breathing techniques and relaxation methods. You will work with a therapist while machines are used to monitor your physical   symptoms.  Stress management techniques. These include yoga,  meditation, and exercise. A mental health specialist can help determine which treatment is best for you. Some people see improvement with one type of therapy. However, other people require a combination of therapies.   Follow these instructions at home: Lifestyle  Maintain a consistent routine and schedule.  Anticipate stressful situations. Create a plan, and allow extra time to work with your plan.  Practice stress management or self-calming techniques that you have learned from your therapist or your health care provider. General instructions  Take over-the-counter and prescription medicines only as told by your health care provider.  Understand that you are likely to have setbacks. Accept this and be kind to yourself as you persist to take better care of yourself.  Recognize and accept your accomplishments, even if you judge them as small.  Keep all follow-up visits as told by your health care provider. This is important. Contact a health care provider if:  Your symptoms do not get better.  Your symptoms get worse.  You have signs of depression, such as: ? A persistently sad or irritable mood. ? Loss of enjoyment in activities that used to bring you joy. ? Change in weight or eating. ? Changes in sleeping habits. ? Avoiding friends or family members. ? Loss of energy for normal tasks. ? Feelings of guilt or worthlessness. Get help right away if:  You have serious thoughts about hurting yourself or others. If you ever feel like you may hurt yourself or others, or have thoughts about taking your own life, get help right away. Go to your nearest emergency department or:  Call your local emergency services (911 in the [place]  Call a suicide crisis helpline, such as the National Suicide Prevention Lifeline at 1-800-273-8255. This is open 24 hours a day in the [place]  Text the Crisis Text Line at 741741 (in the [place] Summary  Generalized anxiety disorder (GAD) is a mental  health condition that involves worry that is not triggered by a specific event.  People with GAD often worry excessively about many things in their lives, such as their health and family.  GAD may cause symptoms such as restlessness, trouble concentrating, sleep problems, frequent sweating, nausea, diarrhea, headaches, and trembling or muscle twitching.  A mental health specialist can help determine which treatment is best for you. Some people see improvement with one type of therapy. However, other people require a combination of therapies. This information is not intended to replace advice given to you by your health care provider. Make sure you discuss any questions you have with your health care provider. Document Revised: 07/03/2019 Document Reviewed: 07/03/2019 Elsevier Patient Education  2021 Elsevier Inc.  

## 2021-02-05 NOTE — Progress Notes (Signed)
   Virtual Visit  Note Due to COVID-19 pandemic this visit was conducted virtually. This visit type was conducted due to national recommendations for restrictions regarding the COVID-19 Pandemic (e.g. social distancing, sheltering in place) in an effort to limit this patient's exposure and mitigate transmission in our community. All issues noted in this document were discussed and addressed.  A physical exam was not performed with this format.  I connected with Dean Ferguson on 02/05/21 at 3:49 pm  by telephone and verified that I am speaking with the correct person using two identifiers. Dean Ferguson is currently located at car and no one is currently with him  during visit. The provider, Jannifer Rodney, FNP is located in their office at time of visit.  I discussed the limitations, risks, security and privacy concerns of performing an evaluation and management service by telephone and the availability of in person appointments. I also discussed with the patient that there may be a patient responsible charge related to this service. The patient expressed understanding and agreed to proceed.   History and Present Illness:  Pt presents to the office today to discuss anxiety. He is currently taking Lexapro 20 mg daily. This is helping day to day, however, he is scheduled to fly out of town  Sunday. He reports he has increased anxiety about flying.  Anxiety Presents for follow-up visit. Symptoms include depressed mood, excessive worry, irritability, nervous/anxious behavior, obsessions and palpitations. Symptoms occur most days. The severity of symptoms is moderate.        Review of Systems  Constitutional: Positive for irritability.  Cardiovascular: Positive for palpitations.  Psychiatric/Behavioral: The patient is nervous/anxious.      Observations/Objective: No SOB or distress noted   Assessment and Plan: 1. Anxiety with flying - ALPRAZolam (XANAX) 0.25 MG tablet; Take 1 tablet (0.25  mg total) by mouth at bedtime as needed for anxiety.  Dispense: 5 tablet; Refill: 0  2. GAD (generalized anxiety disorder) - ALPRAZolam (XANAX) 0.25 MG tablet; Take 1 tablet (0.25 mg total) by mouth at bedtime as needed for anxiety.  Dispense: 5 tablet; Refill: 0  Continue Lexapro   Stress management  RTO if symptoms worsen    I discussed the assessment and treatment plan with the patient. The patient was provided an opportunity to ask questions and all were answered. The patient agreed with the plan and demonstrated an understanding of the instructions.   The patient was advised to call back or seek an in-person evaluation if the symptoms worsen or if the condition fails to improve as anticipated.  The above assessment and management plan was discussed with the patient. The patient verbalized understanding of and has agreed to the management plan. Patient is aware to call the clinic if symptoms persist or worsen. Patient is aware when to return to the clinic for a follow-up visit. Patient educated on when it is appropriate to go to the emergency department.   Time call ended:  4:00 pm   I provided 11 minutes of  non face-to-face time during this encounter.    Jannifer Rodney, FNP

## 2021-02-20 ENCOUNTER — Other Ambulatory Visit: Payer: Self-pay | Admitting: Family

## 2021-02-23 ENCOUNTER — Other Ambulatory Visit: Payer: Self-pay | Admitting: Family

## 2021-02-23 DIAGNOSIS — F411 Generalized anxiety disorder: Secondary | ICD-10-CM

## 2021-02-23 DIAGNOSIS — F320 Major depressive disorder, single episode, mild: Secondary | ICD-10-CM

## 2021-06-02 ENCOUNTER — Other Ambulatory Visit: Payer: Self-pay | Admitting: Family

## 2021-06-10 ENCOUNTER — Encounter: Payer: 59 | Admitting: Family

## 2021-07-01 ENCOUNTER — Ambulatory Visit (INDEPENDENT_AMBULATORY_CARE_PROVIDER_SITE_OTHER): Payer: 59 | Admitting: Family

## 2021-07-01 ENCOUNTER — Encounter: Payer: Self-pay | Admitting: Family

## 2021-07-01 ENCOUNTER — Other Ambulatory Visit: Payer: Self-pay

## 2021-07-01 VITALS — BP 137/92 | HR 105 | Temp 98.3°F | Ht 73.0 in | Wt 270.0 lb

## 2021-07-01 DIAGNOSIS — F40243 Fear of flying: Secondary | ICD-10-CM

## 2021-07-01 DIAGNOSIS — F411 Generalized anxiety disorder: Secondary | ICD-10-CM | POA: Diagnosis not present

## 2021-07-01 DIAGNOSIS — F1010 Alcohol abuse, uncomplicated: Secondary | ICD-10-CM

## 2021-07-01 DIAGNOSIS — F320 Major depressive disorder, single episode, mild: Secondary | ICD-10-CM | POA: Diagnosis not present

## 2021-07-01 DIAGNOSIS — Z0000 Encounter for general adult medical examination without abnormal findings: Secondary | ICD-10-CM

## 2021-07-01 DIAGNOSIS — I10 Essential (primary) hypertension: Secondary | ICD-10-CM

## 2021-07-01 DIAGNOSIS — F5104 Psychophysiologic insomnia: Secondary | ICD-10-CM

## 2021-07-01 MED ORDER — LOSARTAN POTASSIUM 50 MG PO TABS: 50.0000 mg | ORAL_TABLET | Freq: Every day | ORAL | 1 refills | Status: DC

## 2021-07-01 MED ORDER — ZOLPIDEM TARTRATE 10 MG PO TABS
10.0000 mg | ORAL_TABLET | Freq: Every evening | ORAL | 2 refills | Status: DC | PRN
Start: 2021-07-01 — End: 2022-01-21

## 2021-07-01 MED ORDER — DESVENLAFAXINE SUCCINATE ER 100 MG PO TB24: 100.0000 mg | ORAL_TABLET | Freq: Every day | ORAL | 1 refills | Status: DC

## 2021-07-01 MED ORDER — ALPRAZOLAM 0.25 MG PO TABS: 0.2500 mg | ORAL_TABLET | Freq: Every evening | ORAL | 0 refills | Status: DC | PRN

## 2021-07-01 NOTE — Progress Notes (Signed)
Subjective:     Patient ID: Dean Ferguson, male   DOB: [birth year], 35 y.o.   MRN: [id number]  Chief Complaint  Patient presents with  . Annual Exam   Pt presents to the office today CPE. He is currently taking Lexapro 20 mg daily. This is helping day to day, however, he is scheduled to fly out of town  Sunday. He reports he has increased anxiety about flying. He takes xanax as needed when flying.   He reports he drinks 3-4 shots a night to help him sleep. He states without this he can not sleep.  Anxiety Presents for follow-up visit. Symptoms include depressed mood, excessive worry, irritability, nervous/anxious behavior and restlessness. Patient reports no shortness of breath. Symptoms occur most days. The quality of sleep is good.    Hypertension This is a chronic problem. The current episode started more than 1 year ago. The problem has been waxing and waning since onset. The problem is uncontrolled. Associated symptoms include anxiety. Pertinent negatives include no malaise/fatigue, peripheral edema or shortness of breath. Risk factors for coronary artery disease include dyslipidemia, obesity and male gender. The current treatment provides moderate improvement.  Depression        This is a chronic problem.  The current episode started more than 1 year ago.   The onset quality is gradual.   The problem occurs intermittently.  Associated symptoms include helplessness, hopelessness, irritable, restlessness and sad.  Past treatments include SSRIs - Selective serotonin reuptake inhibitors.  Past medical history includes anxiety.   OSA Uses CPAP most nights.   Review of Systems  Constitutional:  Positive for irritability. Negative for malaise/fatigue.  Respiratory:  Negative for shortness of breath.   Psychiatric/Behavioral:  Positive for depression. The patient is nervous/anxious.   All other systems reviewed and are negative.  Family History  Problem Relation Age of Onset  .  Immunodeficiency Mother   . Hyperlipidemia Father   . Hypertension Father   . Liver disease Paternal Grandfather        etoh  . Liver disease Paternal Aunt   . Colon cancer Neg Hx    Social History   Socioeconomic History  . Marital status: Married    Spouse name: Not on file  . Number of children: Not on file  . Years of education: Not on file  . Highest education level: Not on file  Occupational History  . Not on file  Tobacco Use  . Smoking status: Never  . Smokeless tobacco: Former    Types: Chew    Quit date: 09/26/2004  Vaping Use  . Vaping Use: Never used  Substance and Sexual Activity  . Alcohol use: Yes    Comment: 3-4 liquor daily  . Drug use: Not Currently  . Sexual activity: Not on file  Other Topics Concern  . Not on file  Social History Narrative  . Not on file   Social Determinants of Health   Financial Resource Strain: Not on file  Food Insecurity: Not on file  Transportation Needs: Not on file  Physical Activity: Not on file  Stress: Not on file  Social Connections: Not on file       Objective:   Physical Exam Vitals reviewed.  Constitutional:      General: He is irritable. He is not in acute distress.    Appearance: He is well-developed. He is obese.  HENT:     Head: Normocephalic.     Right Ear: Tympanic membrane normal.  Left Ear: Tympanic membrane normal.  Eyes:     General:        Right eye: No discharge.        Left eye: No discharge.     Pupils: Pupils are equal, round, and reactive to light.  Neck:     Thyroid: No thyromegaly.  Cardiovascular:     Rate and Rhythm: Normal rate and regular rhythm.     Heart sounds: Normal heart sounds. No murmur heard. Pulmonary:     Effort: Pulmonary effort is normal. No respiratory distress.     Breath sounds: Normal breath sounds. No wheezing.  Abdominal:     General: Bowel sounds are normal. There is no distension.     Palpations: Abdomen is soft.     Tenderness: There is no abdominal  tenderness.  Musculoskeletal:        General: No tenderness. Normal range of motion.     Cervical back: Normal range of motion and neck supple.  Skin:    General: Skin is warm and dry.     Findings: No erythema or rash.  Neurological:     Mental Status: He is alert and oriented to person, place, and time.     Cranial Nerves: No cranial nerve deficit.     Deep Tendon Reflexes: Reflexes are normal and symmetric.  Psychiatric:        Behavior: Behavior normal.        Thought Content: Thought content normal.        Judgment: Judgment normal.     BP (!) 137/92   Pulse (!) 105   Temp 98.3 F (36.8 C) (Temporal)   Ht _0  (1.854 m)   Wt 270 lb (122.5 kg)   BMI 35.62 kg/m       Plan:    Dean Ferguson comes in today with chief complaint of Annual Exam   Diagnosis and orders addressed:  1. Anxiety with flying - ALPRAZolam (XANAX) 0.25 MG tablet; Take 1 tablet (0.25 mg total) by mouth at bedtime as needed for anxiety.  Dispense: 5 tablet; Refill: 0 - CMP14+EGFR - CBC with Differential/Platelet  2. GAD (generalized anxiety disorder) Will stop Lexapro and start Pristiq 100 mg  Stress management  - ALPRAZolam (XANAX) 0.25 MG tablet; Take 1 tablet (0.25 mg total) by mouth at bedtime as needed for anxiety.  Dispense: 5 tablet; Refill: 0 - desvenlafaxine (PRISTIQ) 100 MG 24 hr tablet; Take 1 tablet (100 mg total) by mouth daily.  Dispense: 90 tablet; Refill: 1 - CMP14+EGFR - CBC with Differential/Platelet  3. Depression, major, single episode, mild ([facility]) Will stop lexapro ans start Pristiq - desvenlafaxine (PRISTIQ) 100 MG 24 hr tablet; Take 1 tablet (100 mg total) by mouth daily.  Dispense: 90 tablet; Refill: 1 - CMP14+EGFR - CBC with Differential/Platelet  4. Psychophysiological insomnia Stop alcohol and try Ambien  Sleep ritual  - desvenlafaxine (PRISTIQ) 100 MG 24 hr tablet; Take 1 tablet (100 mg total) by mouth daily.  Dispense: 90 tablet; Refill: 1 - zolpidem (AMBIEN)  10 MG tablet; Take 1 tablet (10 mg total) by mouth at bedtime as needed for sleep.  Dispense: 30 tablet; Refill: 2 - CMP14+EGFR - CBC with Differential/Platelet  5. Alcohol abuse - CMP14+EGFR - CBC with Differential/Platelet  6. Annual physical exam - CMP14+EGFR - CBC with Differential/Platelet - Lipid panel - TSH  7. Primary hypertension - Pt has stopped lisinopril because of diarrhea. Start Losartan  - losartan (COZAAR) 50 MG tablet; Take 1  tablet (50 mg total) by mouth daily.  Dispense: 90 tablet; Refill: 1 - CMP14+EGFR - CBC with Differential/Platelet   Labs pending Health Maintenance reviewed Diet and exercise encouraged  Follow up plan: 1 month to recheck GAD, Depression, Insomnia, and HTN   Evelina Dun, FNP

## 2021-07-01 NOTE — Patient Instructions (Signed)
Insomnia Insomnia is a sleep disorder that makes it difficult to fall asleep or stay asleep. Insomnia can cause fatigue, low energy, difficulty concentrating, moodswings, and poor performance at work or school. There are three different ways to classify insomnia: Difficulty falling asleep. Difficulty staying asleep. Waking up too early in the morning. Any type of insomnia can be long-term (chronic) or short-term (acute). Both are common. Short-term insomnia usually lasts for three months or less. Chronic insomnia occurs at least three times a week for longer than threemonths. What are the causes? Insomnia may be caused by another condition, situation, or substance, such as: Anxiety. Certain medicines. Gastroesophageal reflux disease (GERD) or other gastrointestinal conditions. Asthma or other breathing conditions. Restless legs syndrome, sleep apnea, or other sleep disorders. Chronic pain. Menopause. Stroke. Abuse of alcohol, tobacco, or illegal drugs. Mental health conditions, such as depression. Caffeine. Neurological disorders, such as Alzheimer's disease. An overactive thyroid (hyperthyroidism). Sometimes, the cause of insomnia may not be known. What increases the risk? Risk factors for insomnia include: Gender. Women are affected more often than men. Age. Insomnia is more common as you get older. Stress. Lack of exercise. Irregular work schedule or working night shifts. Traveling between different time zones. Certain medical and mental health conditions. What are the signs or symptoms? If you have insomnia, the main symptom is having trouble falling asleep or having trouble staying asleep. This may lead to other symptoms, such as: Feeling fatigued or having low energy. Feeling nervous about going to sleep. Not feeling rested in the morning. Having trouble concentrating. Feeling irritable, anxious, or depressed. How is this diagnosed? This condition may be diagnosed based  on: Your symptoms and medical history. Your health care provider may ask about: Your sleep habits. Any medical conditions you have. Your mental health. A physical exam. How is this treated? Treatment for insomnia depends on the cause. Treatment may focus on treating an underlying condition that is causing insomnia. Treatment may also include: Medicines to help you sleep. Counseling or therapy. Lifestyle adjustments to help you sleep better. Follow these instructions at home: Eating and drinking  Limit or avoid alcohol, caffeinated beverages, and cigarettes, especially close to bedtime. These can disrupt your sleep. Do not eat a large meal or eat spicy foods right before bedtime. This can lead to digestive discomfort that can make it hard for you to sleep.  Sleep habits  Keep a sleep diary to help you and your health care provider figure out what could be causing your insomnia. Write down: When you sleep. When you wake up during the night. How well you sleep. How rested you feel the next day. Any side effects of medicines you are taking. What you eat and drink. Make your bedroom a dark, comfortable place where it is easy to fall asleep. Put up shades or blackout curtains to block light from outside. Use a white noise machine to block noise. Keep the temperature cool. Limit screen use before bedtime. This includes: Watching TV. Using your smartphone, tablet, or computer. Stick to a routine that includes going to bed and waking up at the same times every day and night. This can help you fall asleep faster. Consider making a quiet activity, such as reading, part of your nighttime routine. Try to avoid taking naps during the day so that you sleep better at night. Get out of bed if you are still awake after 15 minutes of trying to sleep. Keep the lights down, but try reading or doing a quiet   activity. When you feel sleepy, go back to bed.  General instructions Take over-the-counter  and prescription medicines only as told by your health care provider. Exercise regularly, as told by your health care provider. Avoid exercise starting several hours before bedtime. Use relaxation techniques to manage stress. Ask your health care provider to suggest some techniques that may work well for you. These may include: Breathing exercises. Routines to release muscle tension. Visualizing peaceful scenes. Make sure that you drive carefully. Avoid driving if you feel very sleepy. Keep all follow-up visits as told by your health care provider. This is important. Contact a health care provider if: You are tired throughout the day. You have trouble in your daily routine due to sleepiness. You continue to have sleep problems, or your sleep problems get worse. Get help right away if: You have serious thoughts about hurting yourself or someone else. If you ever feel like you may hurt yourself or others, or have thoughts about taking your own life, get help right away. You can go to your nearest emergency department or call: Your local emergency services (911 in the [place] A suicide crisis helpline, such as the National Suicide Prevention Lifeline at 1-800-273-8255. This is open 24 hours a day. Summary Insomnia is a sleep disorder that makes it difficult to fall asleep or stay asleep. Insomnia can be long-term (chronic) or short-term (acute). Treatment for insomnia depends on the cause. Treatment may focus on treating an underlying condition that is causing insomnia. Keep a sleep diary to help you and your health care provider figure out what could be causing your insomnia. This information is not intended to replace advice given to you by your health care provider. Make sure you discuss any questions you have with your healthcare provider. Document Revised: 07/23/2020 Document Reviewed: 07/23/2020 Elsevier Patient Education  2022 Elsevier Inc.  

## 2021-07-02 ENCOUNTER — Other Ambulatory Visit: Payer: Self-pay | Admitting: Family

## 2021-07-02 LAB — CMP14+EGFR
ALT: 92 IU/L — ABNORMAL HIGH (ref 0–44)
AST: 130 IU/L — ABNORMAL HIGH (ref 0–40)
Albumin/Globulin Ratio: 1.9 (ref 1.2–2.2)
Albumin: 4.6 g/dL (ref 4.0–5.0)
Alkaline Phosphatase: 114 IU/L (ref 44–121)
BUN/Creatinine Ratio: 9 (ref 9–20)
BUN: 8 mg/dL (ref 6–20)
Bilirubin Total: 1 mg/dL (ref 0.0–1.2)
CO2: 21 mmol/L (ref 20–29)
Calcium: 9.9 mg/dL (ref 8.7–10.2)
Chloride: 98 mmol/L (ref 96–106)
Creatinine, Ser: 0.88 mg/dL (ref 0.76–1.27)
Globulin, Total: 2.4 g/dL (ref 1.5–4.5)
Glucose: 92 mg/dL (ref 70–99)
Potassium: 4.4 mmol/L (ref 3.5–5.2)
Sodium: 139 mmol/L (ref 134–144)
Total Protein: 7 g/dL (ref 6.0–8.5)
eGFR: 114 mL/min/{1.73_m2} (ref >59)

## 2021-07-02 LAB — CBC WITH DIFFERENTIAL/PLATELET
Basophils Absolute: 0.1 10*3/uL (ref 0.0–0.2)
Basos: 1 %
EOS (ABSOLUTE): 0.1 10*3/uL (ref 0.0–0.4)
Eos: 1 %
Hematocrit: 46.1 % (ref 37.5–51.0)
Hemoglobin: 16.6 g/dL (ref 13.0–17.7)
Immature Grans (Abs): 0 10*3/uL (ref 0.0–0.1)
Immature Granulocytes: 0 %
Lymphocytes Absolute: 1.9 10*3/uL (ref 0.7–3.1)
Lymphs: 24 %
MCH: 37.7 pg — ABNORMAL HIGH (ref 26.6–33.0)
MCHC: 36 g/dL — ABNORMAL HIGH (ref 31.5–35.7)
MCV: 105 fL — ABNORMAL HIGH (ref 79–97)
Monocytes Absolute: 0.7 10*3/uL (ref 0.1–0.9)
Monocytes: 9 %
Neutrophils Absolute: 5.2 10*3/uL (ref 1.4–7.0)
Neutrophils: 65 %
Platelets: 185 10*3/uL (ref 150–450)
RBC: 4.4 x10E6/uL (ref 4.14–5.80)
RDW: 13.1 % (ref 11.6–15.4)
WBC: 8 10*3/uL (ref 3.4–10.8)

## 2021-07-02 LAB — TSH: TSH: 3.24 u[IU]/mL (ref 0.450–4.500)

## 2021-07-02 LAB — LIPID PANEL
Chol/HDL Ratio: 6.1 ratio — ABNORMAL HIGH (ref 0.0–5.0)
Cholesterol, Total: 258 mg/dL — ABNORMAL HIGH (ref 100–199)
HDL: 42 mg/dL (ref >39)
LDL Chol Calc (NIH): 191 mg/dL — ABNORMAL HIGH (ref 0–99)
Triglycerides: 135 mg/dL (ref 0–149)
VLDL Cholesterol Cal: 25 mg/dL (ref 5–40)

## 2021-07-02 MED ORDER — ROSUVASTATIN CALCIUM 5 MG PO TABS: 5.0000 mg | ORAL_TABLET | Freq: Every day | ORAL | 3 refills | Status: DC

## 2021-07-29 ENCOUNTER — Ambulatory Visit (INDEPENDENT_AMBULATORY_CARE_PROVIDER_SITE_OTHER): Payer: 59 | Admitting: Family

## 2021-07-29 ENCOUNTER — Encounter: Payer: Self-pay | Admitting: Family

## 2021-07-29 ENCOUNTER — Other Ambulatory Visit: Payer: Self-pay

## 2021-07-29 VITALS — BP 126/82 | HR 99 | Temp 98.3°F | Ht 73.0 in | Wt 268.0 lb

## 2021-07-29 DIAGNOSIS — F1010 Alcohol abuse, uncomplicated: Secondary | ICD-10-CM | POA: Diagnosis not present

## 2021-07-29 DIAGNOSIS — F411 Generalized anxiety disorder: Secondary | ICD-10-CM | POA: Diagnosis not present

## 2021-07-29 DIAGNOSIS — R748 Abnormal levels of other serum enzymes: Secondary | ICD-10-CM

## 2021-07-29 DIAGNOSIS — F320 Major depressive disorder, single episode, mild: Secondary | ICD-10-CM

## 2021-07-29 NOTE — Progress Notes (Signed)
Subjective:    Patient ID: Dean Ferguson, male    DOB: Feb 22, 1986, 35 y.o.   MRN: [id number]  Chief Complaint  Patient presents with   Follow-up   Pt presents to the office today to follow up on GAD and Depression. He was seen on 07/01/21 and stopped his Lexapro and started Pristiq 100 mg. He has been able to see a difference and seems to want to do things more.   We also started ambien to help with sleep. He has tried decreasing his alcohol. He only takes this on nights he does not drink. He is currently drinking 4 nights out of the week.  Depression        This is a chronic problem.  The current episode started more than 1 year ago.   The onset quality is gradual.   Associated symptoms include insomnia, irritable, restlessness and sad.  Associated symptoms include no helplessness and no hopelessness.  Past treatments include SNRIs - Serotonin and norepinephrine reuptake inhibitors.  Past medical history includes anxiety.   Anxiety Presents for follow-up visit. Symptoms include depressed mood, excessive worry, insomnia, irritability, nervous/anxious behavior and restlessness.    Insomnia Primary symptoms: difficulty falling asleep.   PMH includes: depression.      Review of Systems  Constitutional:  Positive for irritability.  Psychiatric/Behavioral:  Positive for depression. The patient is nervous/anxious and has insomnia.   All other systems reviewed and are negative.     Objective:   Physical Exam Vitals reviewed.  Constitutional:      General: He is irritable. He is not in acute distress.    Appearance: He is well-developed. He is obese.  HENT:     Head: Normocephalic.     Right Ear: Tympanic membrane normal.     Left Ear: Tympanic membrane normal.  Eyes:     General:        Right eye: No discharge.        Left eye: No discharge.     Pupils: Pupils are equal, round, and reactive to light.  Neck:     Thyroid: No thyromegaly.  Cardiovascular:     Rate and Rhythm:  Normal rate and regular rhythm.     Heart sounds: Normal heart sounds. No murmur heard. Pulmonary:     Effort: Pulmonary effort is normal. No respiratory distress.     Breath sounds: Normal breath sounds. No wheezing.  Abdominal:     General: Bowel sounds are normal. There is no distension.     Palpations: Abdomen is soft.     Tenderness: There is no abdominal tenderness.  Musculoskeletal:        General: No tenderness. Normal range of motion.     Cervical back: Normal range of motion and neck supple.  Skin:    General: Skin is warm and dry.     Findings: No erythema or rash.  Neurological:     Mental Status: He is alert and oriented to person, place, and time.     Cranial Nerves: No cranial nerve deficit.     Deep Tendon Reflexes: Reflexes are normal and symmetric.  Psychiatric:        Behavior: Behavior normal.        Thought Content: Thought content normal.        Judgment: Judgment normal.     BP 126/82   Pulse 99   Temp 98.3 F (36.8 C) (Temporal)   Ht 6\' 1"  (1.854 m)   Wt 268 lb (  121.6 kg)   BMI 35.36 kg/m       Assessment & Plan:  Dean Ferguson comes in today with chief complaint of Follow-up   Diagnosis and orders addressed:  1. Depression, major, single episode, mild ([facility])  2. GAD (generalized anxiety disorder)  3. Morbid obesity ([facility])  4. Alcohol abuse  5. Elevated liver enzymes  Continue medications  Labs reviewed Health Maintenance reviewed Diet and exercise encouraged  Follow up plan: 3 months    Jannifer Rodney, FNP

## 2021-07-29 NOTE — Patient Instructions (Signed)
Major Depressive Disorder, Adult Major depressive disorder (MDD) is a mental health condition. It may also be called clinical depression or unipolar depression. MDD causes symptoms of sadness, hopelessness, and loss of interest in things. These symptoms last most of the day, almost every day, for 2 weeks. MDD can also cause physical symptoms. It can interfere with relationships and with everyday activities,such as work, school, and activities that are usually pleasant. MDD may be mild, moderate, or severe. It may be single-episode MDD, whichhappens once, or recurrent MDD, which may occur multiple times. What are the causes? The exact cause of this condition is not known. MDD is most likely caused by a combination of things, which may include: Your personality traits. Learned or conditioned behaviors or thoughts or feelings that reinforce negativity. Any alcohol or substance misuse. Long-term (chronic) physical or mental health illness. Going through a traumatic experience or major life changes. What increases the risk? The following factors may make someone more likely to develop MDD: A family history of depression. Being a woman. Troubled family relationships. Abnormally low levels of certain brain chemicals. Traumatic or painful events in childhood, especially abuse or loss of a parent. A lot of stress from life experiences, such as poor living conditions or discrimination. Chronic physical illness or other mental health disorders. What are the signs or symptoms? The main symptoms of MDD usually include: Constant depressed or irritable mood. A loss of interest in things and activities. Other symptoms include: Sleeping or eating too much or too little. Unexplained weight gain or weight loss. Tiredness or low energy. Being agitated, restless, or weak. Feeling hopeless, worthless, or guilty. Trouble thinking clearly or making decisions. Thoughts of suicide or thoughts of harming  others. Isolating oneself or avoiding other people or activities. Trouble completing tasks, work, or any normal obligations. Severe symptoms of this condition may include: Psychotic depression.This may include false beliefs, or delusions. It may also include seeing, hearing, tasting, smelling, or feeling things that are not real (hallucinations). Chronic depression or persistent depressive disorder. This is low-level depression that lasts for at least 2 years. Melancholic depression, or feeling extremely sad and hopeless. Catatonic depression, which includes trouble speaking and trouble moving. How is this diagnosed? This condition may be diagnosed based on: Your symptoms. Your medical and mental health history. You may be asked questions about your lifestyle, including any drug and alcohol use. A physical exam. Blood tests to rule out other conditions. MDD is confirmed if you have the following symptoms most of the day, nearly every day, in a 2-week period: Either a depressed mood or loss of interest. At least four other MDD symptoms. How is this treated? This condition is usually treated by mental health professionals, such as psychologists, psychiatrists, and clinical social workers. You may need more than one type of treatment. Treatment may include: Psychotherapy, also called talk therapy or counseling. Types of psychotherapy include: Cognitive behavioral therapy (CBT). This teaches you to recognize unhealthy feelings, thoughts, and behaviors, and replace them with positive thoughts and actions. Interpersonal therapy (IPT). This helps you to improve the way you communicate with others or relate to them. Family therapy. This treatment includes members of your family. Medicines to treat anxiety and depression. These medicines help to balance the brain chemicals that affect your emotions. Lifestyle changes. You may be asked to: Limit alcohol use and avoid drug use. Get regular  exercise. Get plenty of sleep. Make healthy eating choices. Spend more time outdoors. Brain stimulation. This may be done   if symptoms are very severe and other treatments have not worked. Examples of this treatment are electroconvulsive therapy and transcranial magnetic stimulation. Follow these instructions at home: Activity Exercise regularly and spend time outdoors. Find activities that you enjoy doing, and make time to do them. Find healthy ways to manage stress, such as: Meditation or deep breathing. Spending time in nature. Journaling. Return to your normal activities as told by your health care provider. Ask your health care provider what activities are safe for you. Alcohol and drug use If you drink alcohol: Limit how much you use to: 0-1 drink a day for women who are not pregnant. 0-2 drinks a day for men. Be aware of how much alcohol is in your drink. In the [place] one drink equals one 12 oz bottle of beer (355 mL), one 5 oz glass of wine (148 mL), or one 1 oz glass of hard liquor (44 mL). Discuss your alcohol use with your health care provider. Alcohol can affect any antidepressant medicines you are taking. Discuss any drug use with your health care provider. General instructions  Take over-the-counter and prescription medicines only as told by your health care provider. Eat a healthy diet and get plenty of sleep. Consider joining a support group. Your health care provider may be able to recommend one. Keep all follow-up visits as told by your health care provider. This is important.  Where to find more information National Alliance on Mental Illness: www.nami.org [place] National Institute of Mental Health: www.nimh.nih.gov Contact a health care provider if: Your symptoms get worse. You develop new symptoms. Get help right away if: You self-harm. You have serious thoughts about hurting yourself or others. You hallucinate. If you ever feel like you may hurt yourself or  others, or have thoughts about taking your own life, get help right away. Go to your nearest emergency department or: Call your local emergency services (911 in the [place] Call a suicide crisis helpline, such as the National Suicide Prevention Lifeline at 1-800-273-8255. This is open 24 hours a day in the [place] Text the Crisis Text Line at 741741 (in the [place] Summary Major depressive disorder (MDD) is a mental health condition. MDD causes symptoms of sadness, hopelessness, and loss of interest in things. These symptoms last most of the day, almost every day, for 2 weeks. The symptoms of MDD can interfere with relationships and with everyday activities. Treatments and support are available for people who develop MDD. You may need more than one type of treatment. Get help right away if you have serious thoughts about hurting yourself or others. This information is not intended to replace advice given to you by your health care provider. Make sure you discuss any questions you have with your healthcare provider. Document Revised: 08/24/2019 Document Reviewed: 08/24/2019 Elsevier Patient Education  2022 Elsevier Inc.  

## 2021-07-31 ENCOUNTER — Other Ambulatory Visit: Payer: Self-pay | Admitting: Family

## 2021-08-24 ENCOUNTER — Other Ambulatory Visit: Payer: Self-pay | Admitting: Family

## 2021-10-29 ENCOUNTER — Ambulatory Visit: Payer: 59 | Admitting: Family

## 2021-11-17 ENCOUNTER — Other Ambulatory Visit: Payer: Self-pay | Admitting: Family

## 2021-11-17 DIAGNOSIS — F320 Major depressive disorder, single episode, mild: Secondary | ICD-10-CM

## 2021-11-17 DIAGNOSIS — F411 Generalized anxiety disorder: Secondary | ICD-10-CM

## 2021-11-26 ENCOUNTER — Ambulatory Visit: Payer: 59 | Admitting: Family

## 2021-12-10 ENCOUNTER — Ambulatory Visit: Payer: 59 | Admitting: Family

## 2021-12-23 ENCOUNTER — Other Ambulatory Visit: Payer: Self-pay | Admitting: Family

## 2021-12-23 DIAGNOSIS — I10 Essential (primary) hypertension: Secondary | ICD-10-CM

## 2021-12-26 ENCOUNTER — Other Ambulatory Visit: Payer: Self-pay | Admitting: Family

## 2021-12-26 DIAGNOSIS — F320 Major depressive disorder, single episode, mild: Secondary | ICD-10-CM

## 2021-12-26 DIAGNOSIS — F411 Generalized anxiety disorder: Secondary | ICD-10-CM

## 2021-12-26 DIAGNOSIS — F5104 Psychophysiologic insomnia: Secondary | ICD-10-CM

## 2022-01-20 ENCOUNTER — Other Ambulatory Visit: Payer: Self-pay | Admitting: Family

## 2022-01-20 DIAGNOSIS — F320 Major depressive disorder, single episode, mild: Secondary | ICD-10-CM

## 2022-01-20 DIAGNOSIS — F411 Generalized anxiety disorder: Secondary | ICD-10-CM

## 2022-01-20 DIAGNOSIS — F5104 Psychophysiologic insomnia: Secondary | ICD-10-CM

## 2022-01-20 DIAGNOSIS — I10 Essential (primary) hypertension: Secondary | ICD-10-CM

## 2022-01-20 MED ORDER — LOSARTAN POTASSIUM 50 MG PO TABS: 50.0000 mg | ORAL_TABLET | Freq: Every day | ORAL | 0 refills | Status: DC

## 2022-01-20 NOTE — Telephone Encounter (Signed)
LOV  07/29/2021 ?Next apt - 02/15/22 ?Please advise on refill  ?

## 2022-01-20 NOTE — Telephone Encounter (Signed)
Appt made 5/23 ?

## 2022-01-20 NOTE — Telephone Encounter (Signed)
Controlled  ?Needs visit with PCP ?

## 2022-01-20 NOTE — Telephone Encounter (Signed)
Made appt for 5/23. Patient is not out at this time but will be out before his appt. Stated that he had enough for around 2 weeks. Can enough be sent in to last until his appt?  ?

## 2022-01-20 NOTE — Telephone Encounter (Signed)
[facility] NTBS 30 days given 12/24/21 ?

## 2022-01-20 NOTE — Addendum Note (Signed)
Addended by: Karle Plumber on: 01/20/2022 02:32 PM ? ? Modules accepted: Orders ? ?

## 2022-02-15 ENCOUNTER — Ambulatory Visit: Payer: 59 | Admitting: Family

## 2022-02-15 ENCOUNTER — Other Ambulatory Visit: Payer: Self-pay | Admitting: *Deleted

## 2022-02-15 DIAGNOSIS — I10 Essential (primary) hypertension: Secondary | ICD-10-CM

## 2022-02-15 MED ORDER — LOSARTAN POTASSIUM 50 MG PO TABS: 50.0000 mg | ORAL_TABLET | Freq: Every day | ORAL | 0 refills | Status: DC

## 2022-02-15 NOTE — Telephone Encounter (Signed)
Pt rescheduled to June 1st and 30 day supply sent into pharmacy

## 2022-02-16 IMAGING — US US ABDOMEN COMPLETE
1 series · 14 of 25 positions shown · non-contrast
Comparison: Ultrasound dated 06/17/2010

CLINICAL DATA: 34-year-old male with elevated LFTs.

EXAM:
ABDOMEN ULTRASOUND COMPLETE

[Series 1: us abdomen complete · 14 of 110 slices shown]
[im 1/110]
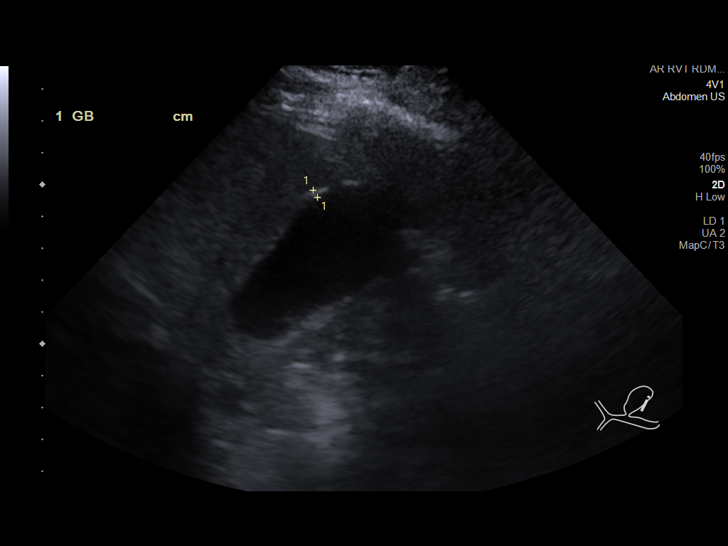
[im 10/110]
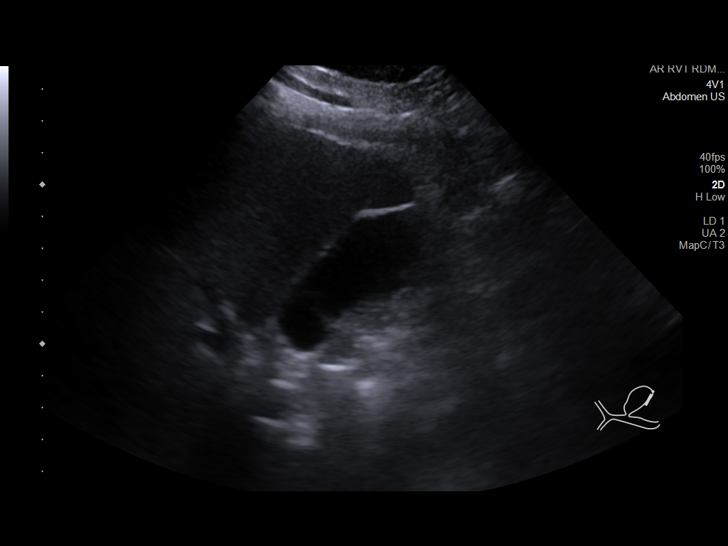
[im 19/110]
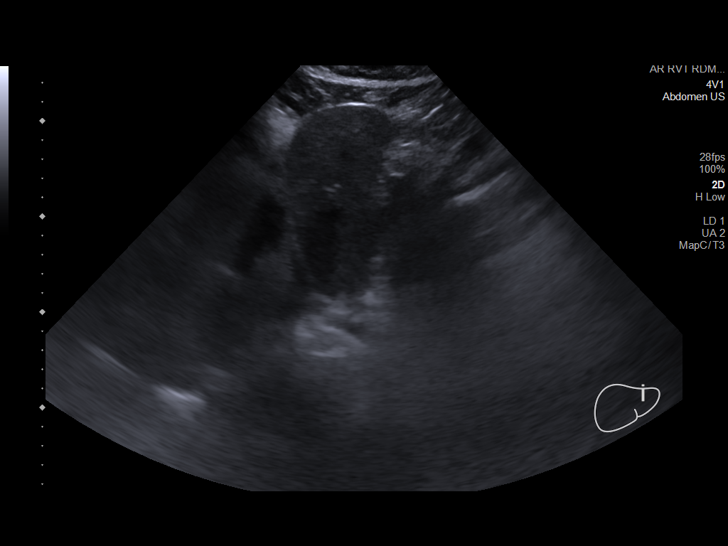
[im 28/110]
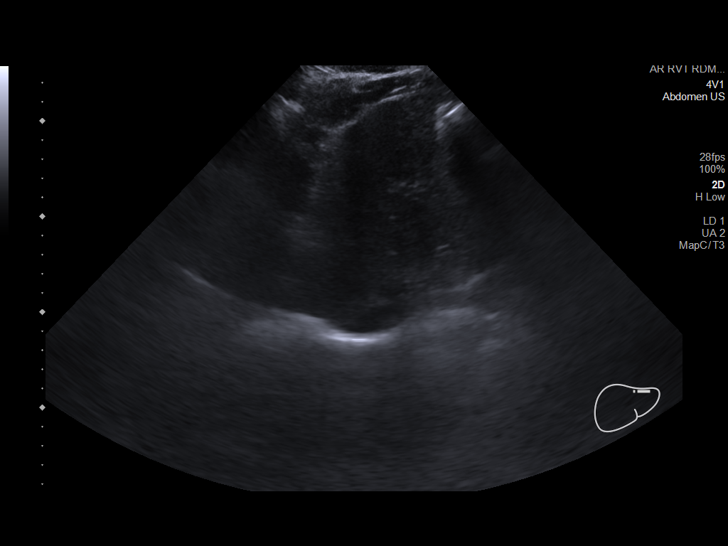
[im 37/110]
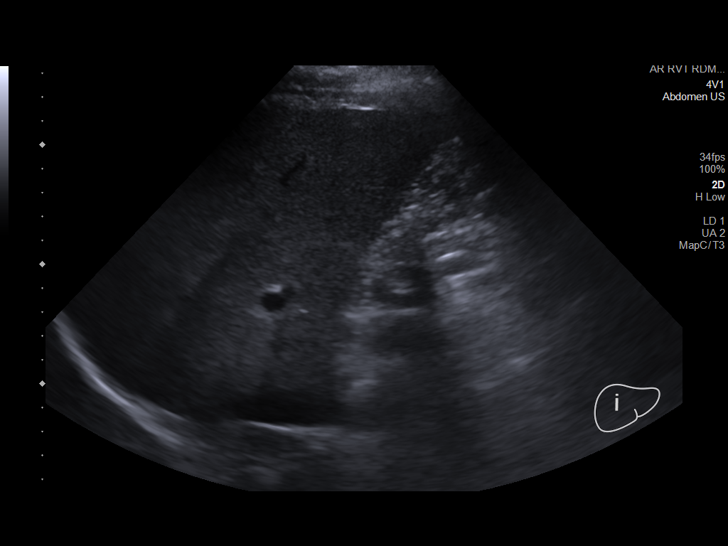
[im 41/110]
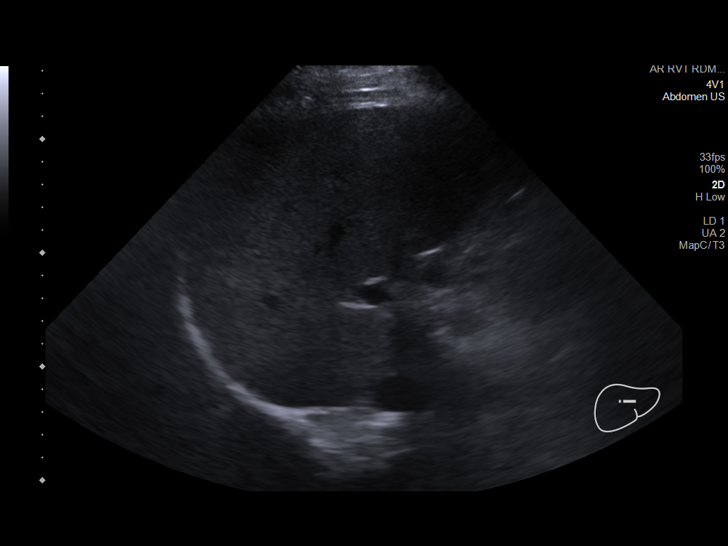
[im 50/110]
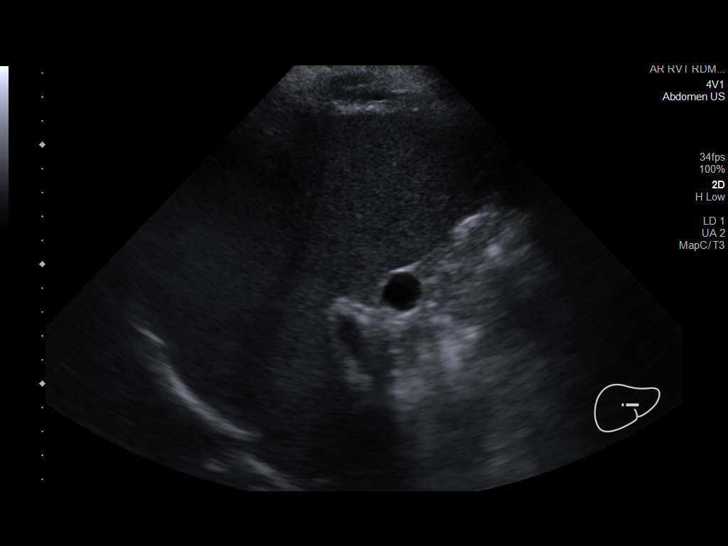
[im 60/110]
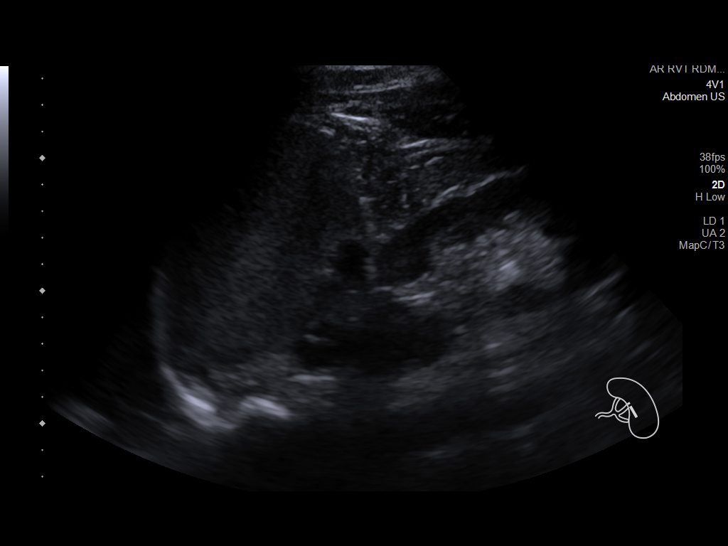
[im 69/110]
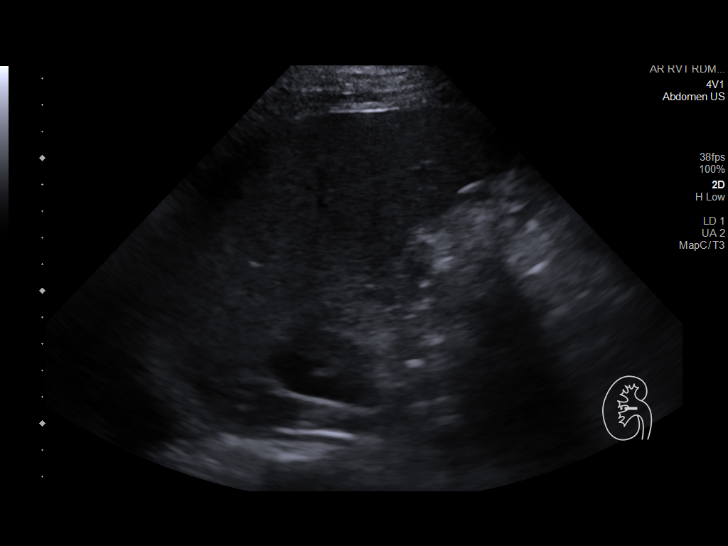
[im 73/110]
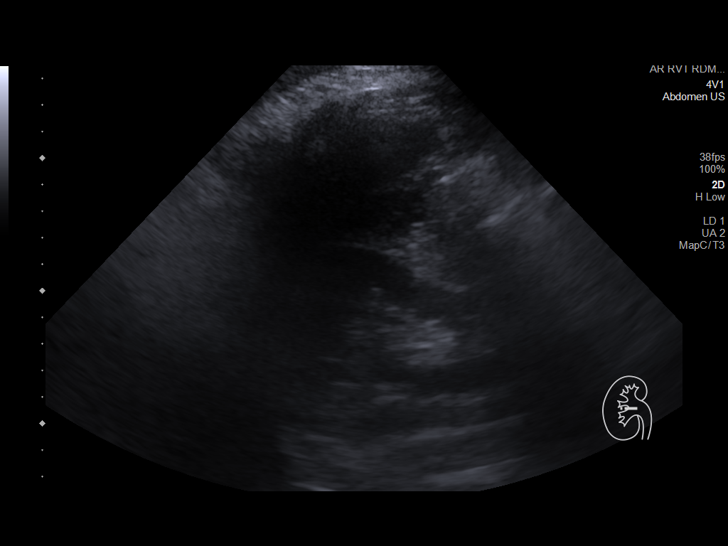
[im 82/110]
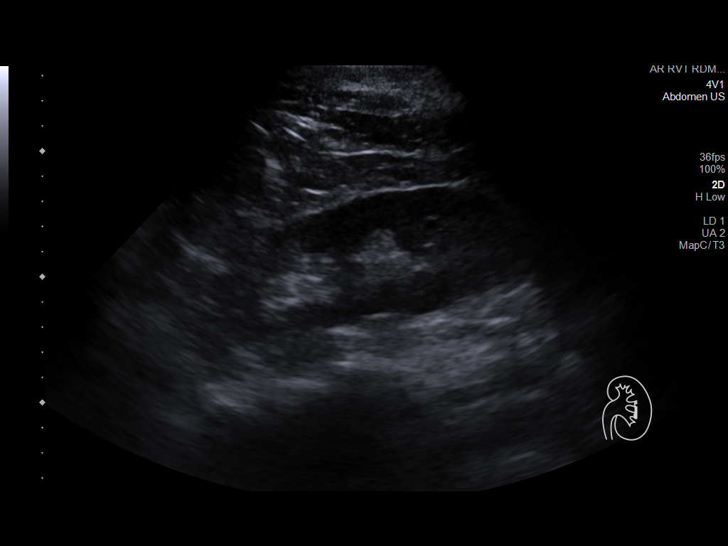
[im 91/110]
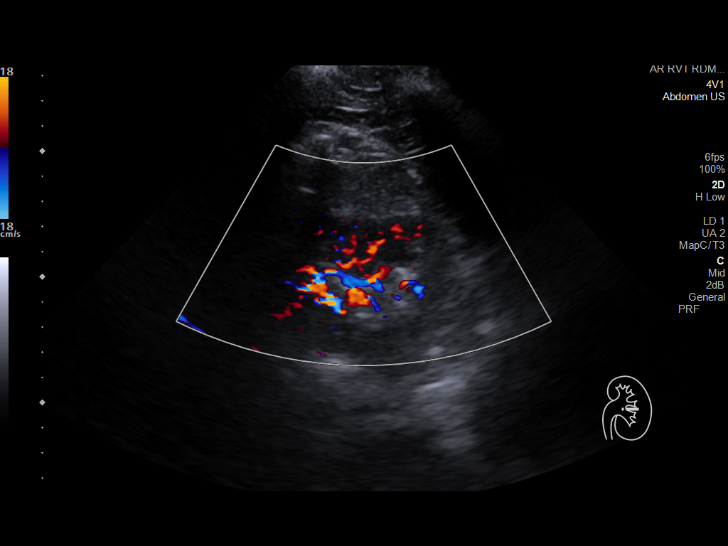
[im 100/110]
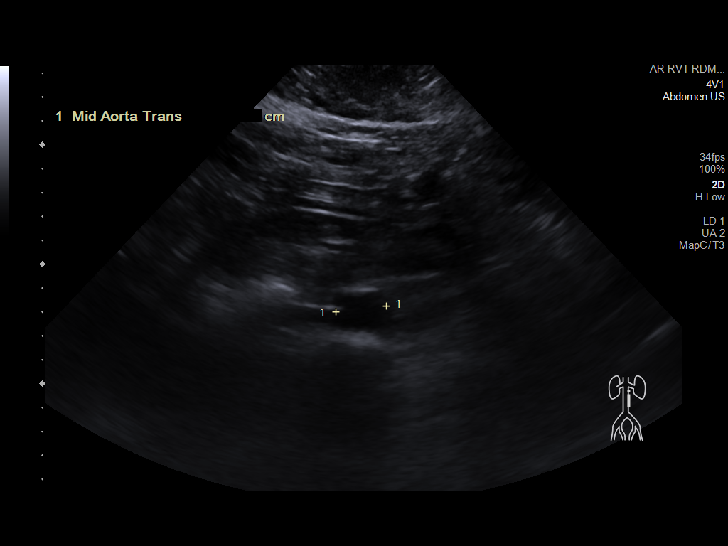
[im 110/110]
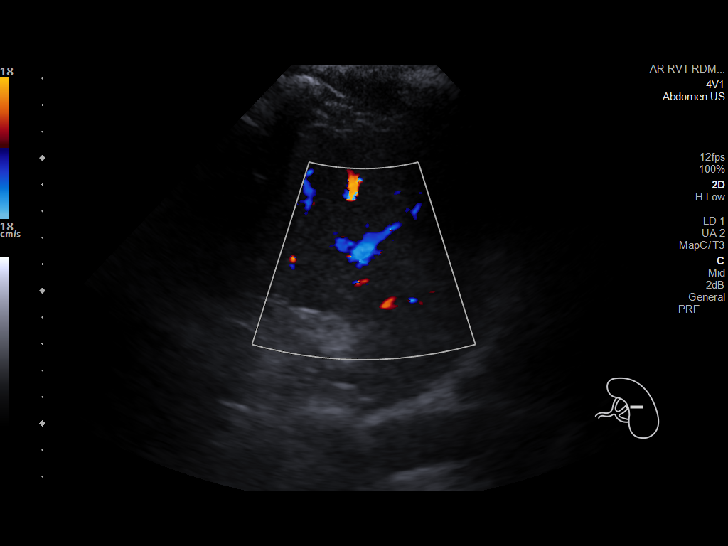

[14 of 25 positions shown; findings below may reference images not displayed]

FINDINGS: Gallbladder: No gallstones or wall thickening visualized. No
sonographic Murphy sign noted by sonographer.

Common bile duct: Diameter: 3 mm

Liver: There is diffuse increased liver echogenicity most commonly
seen in the setting of fatty infiltration. Superimposed inflammation
or fibrosis is not excluded. Clinical correlation is recommended.
Portal vein is patent on color Doppler imaging with normal direction
of blood flow towards the liver.

IVC: No abnormality visualized.

Pancreas: Suboptimally visualized due to bowel gas.

Spleen: Size and appearance within normal limits.

Right Kidney: Length: 12.1 cm. Echogenicity within normal limits. No
mass or hydronephrosis visualized.

Left Kidney: Length: 13.0 cm. Echogenicity within normal limits. No
mass or hydronephrosis visualized. There is a 2 cm upper pole cyst.

Abdominal aorta: No aneurysm visualized.

Other findings: None.
IMPRESSION: 1. Fatty liver.
2. No gallstone.
3. Patent main portal vein with hepatopetal flow.
4. Small left renal cyst.

## 2022-02-17 ENCOUNTER — Other Ambulatory Visit: Payer: Self-pay | Admitting: Family

## 2022-02-24 ENCOUNTER — Encounter: Payer: Self-pay | Admitting: Family

## 2022-02-24 ENCOUNTER — Ambulatory Visit (INDEPENDENT_AMBULATORY_CARE_PROVIDER_SITE_OTHER): Payer: 59 | Admitting: Family

## 2022-02-24 VITALS — BP 128/84 | HR 71 | Temp 97.6°F | Ht 72.0 in | Wt 242.0 lb

## 2022-02-24 DIAGNOSIS — E785 Hyperlipidemia, unspecified: Secondary | ICD-10-CM

## 2022-02-24 DIAGNOSIS — F5104 Psychophysiologic insomnia: Secondary | ICD-10-CM

## 2022-02-24 DIAGNOSIS — M549 Dorsalgia, unspecified: Secondary | ICD-10-CM

## 2022-02-24 DIAGNOSIS — I10 Essential (primary) hypertension: Secondary | ICD-10-CM

## 2022-02-24 DIAGNOSIS — K760 Fatty (change of) liver, not elsewhere classified: Secondary | ICD-10-CM

## 2022-02-24 DIAGNOSIS — G4733 Obstructive sleep apnea (adult) (pediatric): Secondary | ICD-10-CM

## 2022-02-24 DIAGNOSIS — F320 Major depressive disorder, single episode, mild: Secondary | ICD-10-CM

## 2022-02-24 DIAGNOSIS — M459 Ankylosing spondylitis of unspecified sites in spine: Secondary | ICD-10-CM | POA: Insufficient documentation

## 2022-02-24 DIAGNOSIS — F1010 Alcohol abuse, uncomplicated: Secondary | ICD-10-CM

## 2022-02-24 DIAGNOSIS — G8929 Other chronic pain: Secondary | ICD-10-CM

## 2022-02-24 DIAGNOSIS — F411 Generalized anxiety disorder: Secondary | ICD-10-CM

## 2022-02-24 DIAGNOSIS — F419 Anxiety disorder, unspecified: Secondary | ICD-10-CM

## 2022-02-24 DIAGNOSIS — E669 Obesity, unspecified: Secondary | ICD-10-CM

## 2022-02-24 DIAGNOSIS — F40243 Fear of flying: Secondary | ICD-10-CM | POA: Diagnosis not present

## 2022-02-24 DIAGNOSIS — R748 Abnormal levels of other serum enzymes: Secondary | ICD-10-CM

## 2022-02-24 DIAGNOSIS — Z79899 Other long term (current) drug therapy: Secondary | ICD-10-CM

## 2022-02-24 MED ORDER — ROSUVASTATIN CALCIUM 5 MG PO TABS: 5.0000 mg | ORAL_TABLET | Freq: Every day | ORAL | 3 refills | Status: DC

## 2022-02-24 MED ORDER — ALPRAZOLAM 0.25 MG PO TABS: 0.2500 mg | ORAL_TABLET | Freq: Every evening | ORAL | 0 refills | Status: DC | PRN

## 2022-02-24 MED ORDER — ZOLPIDEM TARTRATE 10 MG PO TABS: 10.0000 mg | ORAL_TABLET | Freq: Every evening | ORAL | 3 refills | Status: DC | PRN

## 2022-02-24 NOTE — Patient Instructions (Signed)
Fatty Liver Disease  The liver converts food into energy, removes toxic material from the blood, makes important proteins, and absorbs necessary vitamins from food. Fatty liver disease occurs when too much fat has built up in your liver cells. Fatty liver disease is also called hepatic steatosis. In many cases, fatty liver disease does not cause symptoms or problems. It is often diagnosed when tests are being done for other reasons. However, over time, fatty liver can cause inflammation that may lead to more serious liver problems, such as scarring of the liver (cirrhosis) and liver failure. Fatty liver is associated with insulin resistance, increased body fat, high blood pressure (hypertension), and high cholesterol. These are features of metabolic syndrome and increase your risk for stroke, diabetes, and heart disease. What are the causes? This condition may be caused by components of metabolic syndrome: Obesity. Insulin resistance. High cholesterol. Other causes: Alcohol abuse. Poor nutrition. Cushing syndrome. Pregnancy. Certain drugs. Poisons. Some viral infections. What increases the risk? You are more likely to develop this condition if you: Abuse alcohol. Are overweight. Have diabetes. Have hepatitis. Have a high triglyceride level. Are pregnant. What are the signs or symptoms? Fatty liver disease often does not cause symptoms. If symptoms do develop, they can include: Fatigue and weakness. Weight loss. Confusion. Nausea, vomiting, or abdominal pain. Yellowing of your skin and the white parts of your eyes (jaundice). Itchy skin. How is this diagnosed? This condition may be diagnosed by: A physical exam and your medical history. Blood tests. Imaging tests, such as an ultrasound, CT scan, or MRI. A liver biopsy. A small sample of liver tissue is removed using a needle. The sample is then looked at under a microscope. How is this treated? Fatty liver disease is often  caused by other health conditions. Treatment for fatty liver may involve medicines and lifestyle changes to manage conditions such as: Alcoholism. High cholesterol. Diabetes. Being overweight or obese. Follow these instructions at home:  Do not drink alcohol. If you have trouble quitting, ask your health care provider how to safely quit with the help of medicine or a supervised program. This is important to keep your condition from getting worse. Eat a healthy diet as told by your health care provider. Ask your health care provider about working with a dietitian to develop an eating plan. Exercise regularly. This can help you lose weight and control your cholesterol and diabetes. Talk to your health care provider about an exercise plan and which activities are best for you. Take over-the-counter and prescription medicines only as told by your health care provider. Keep all follow-up visits. This is important. Contact a health care provider if: You have trouble controlling your: Blood sugar. This is especially important if you have diabetes. Cholesterol. Drinking of alcohol. Get help right away if: You have abdominal pain. You have jaundice. You have nausea and are vomiting. You vomit blood or material that looks like coffee grounds. You have stools that are black, tar-like, or bloody. Summary Fatty liver disease develops when too much fat builds up in the cells of your liver. Fatty liver disease often causes no symptoms or problems. However, over time, fatty liver can cause inflammation that may lead to more serious liver problems, such as scarring of the liver (cirrhosis). You are more likely to develop this condition if you abuse alcohol, are pregnant, are overweight, have diabetes, have hepatitis, or have high triglyceride or cholesterol levels. Contact your health care provider if you have trouble controlling your blood   sugar, cholesterol, or drinking of alcohol. This information is  not intended to replace advice given to you by your health care provider. Make sure you discuss any questions you have with your health care provider. Document Revised: 06/25/2020 Document Reviewed: 06/25/2020 Elsevier Patient Education  2023 [employer] Inc[employer]  

## 2022-02-24 NOTE — Progress Notes (Signed)
Subjective:    Patient ID: Dean Ferguson, male    DOB: August 21, 1986, 36 y.o.   MRN: [id number]  Chief Complaint  Patient presents with   Medication Refill   Pt presents to the office today chronic follow up.Marland Kitchen He is currently taking Pristiq 100 mg daily. This is helping day to day, however, he is scheduled to fly to [place] in July. He reports he has increased anxiety about flying. He takes xanax as needed when flying.    He reports he drinks 3-4 shots three times a week. He has decreased this from daily.   He is followed by Rheumatologists for ankylosing spondylitis of multiple sites. Starting on Humira. And followed by Ortho as needed for back pain and hip pain.   Has OSA and uses CPAP some nights.  Hypertension This is a chronic problem. The current episode started more than 1 year ago. The problem has been waxing and waning since onset. The problem is uncontrolled. Associated symptoms include anxiety. Pertinent negatives include no malaise/fatigue, peripheral edema or shortness of breath. The current treatment provides mild improvement.  Anxiety Presents for follow-up visit. Symptoms include excessive worry, irritability and nervous/anxious behavior. Patient reports no shortness of breath. Symptoms occur occasionally. The severity of symptoms is moderate.    Depression        This is a chronic problem.  The current episode started more than 1 year ago.   The onset quality is gradual.   Associated symptoms include no helplessness, no hopelessness and not sad.  Past treatments include SNRIs - Serotonin and norepinephrine reuptake inhibitors.  Past medical history includes anxiety.      Review of Systems  Constitutional:  Positive for irritability. Negative for malaise/fatigue.  Respiratory:  Negative for shortness of breath.   Psychiatric/Behavioral:  Positive for depression. The patient is nervous/anxious.   All other systems reviewed and are negative.     Objective:   Physical  Exam Vitals reviewed.  Constitutional:      General: He is not in acute distress.    Appearance: He is well-developed.  HENT:     Head: Normocephalic.     Right Ear: Tympanic membrane normal.     Left Ear: Tympanic membrane normal.  Eyes:     General:        Right eye: No discharge.        Left eye: No discharge.     Pupils: Pupils are equal, round, and reactive to light.  Neck:     Thyroid: No thyromegaly.  Cardiovascular:     Rate and Rhythm: Normal rate and regular rhythm.     Heart sounds: Normal heart sounds. No murmur heard. Pulmonary:     Effort: Pulmonary effort is normal. No respiratory distress.     Breath sounds: Normal breath sounds. No wheezing.  Abdominal:     General: Bowel sounds are normal. There is no distension.     Palpations: Abdomen is soft.     Tenderness: There is no abdominal tenderness.  Musculoskeletal:        General: No tenderness. Normal range of motion.     Cervical back: Normal range of motion and neck supple.  Skin:    General: Skin is warm and dry.     Findings: No erythema or rash.  Neurological:     Mental Status: He is alert and oriented to person, place, and time.     Cranial Nerves: No cranial nerve deficit.     Deep Tendon  Reflexes: Reflexes are normal and symmetric.  Psychiatric:        Behavior: Behavior normal.        Thought Content: Thought content normal.        Judgment: Judgment normal.    BP 128/84   Pulse 71   Temp 97.6 F (36.4 C)   Ht 6' (1.829 m)   Wt 242 lb (109.8 kg)   SpO2 97%   BMI 32.82 kg/m      Assessment & Plan:  Dean Ferguson comes in today with chief complaint of Medication Refill   Diagnosis and orders addressed:  1. Psychophysiological insomnia - zolpidem (AMBIEN) 10 MG tablet; Take 1 tablet (10 mg total) by mouth at bedtime as needed. for sleep  Dispense: 30 tablet; Refill: 3  2. Anxiety with flying - ALPRAZolam (XANAX) 0.25 MG tablet; Take 1 tablet (0.25 mg total) by mouth at bedtime as  needed for anxiety.  Dispense: 6 tablet; Refill: 0  3. GAD (generalized anxiety disorder) - ALPRAZolam (XANAX) 0.25 MG tablet; Take 1 tablet (0.25 mg total) by mouth at bedtime as needed for anxiety.  Dispense: 6 tablet; Refill: 0  4. Essential hypertension  5. OSA (obstructive sleep apnea)  6. Fatty liver  7. Depression, major, single episode, mild (HCC)  8. Alcohol abuse  9. Chronic back pain, unspecified back location, unspecified back pain laterality  10. Obesity (BMI 30-39.9)  11. Elevated liver enzymes  12. Controlled substance agreement signed - zolpidem (AMBIEN) 10 MG tablet; Take 1 tablet (10 mg total) by mouth at bedtime as needed. for sleep  Dispense: 30 tablet; Refill: 3 - ALPRAZolam (XANAX) 0.25 MG tablet; Take 1 tablet (0.25 mg total) by mouth at bedtime as needed for anxiety.  Dispense: 6 tablet; Refill: 0  13. Hyperlipidemia, unspecified hyperlipidemia type - rosuvastatin (CRESTOR) 5 MG tablet; Take 1 tablet (5 mg total) by mouth daily.  Dispense: 90 tablet; Refill: 3   Labs pending Patient reviewed in [place] controlled database, no flags noted. Contract and drug screen up dated today.  Health Maintenance reviewed Diet and exercise encouraged  Follow up plan: 6 months   Jannifer Rodney, FNP

## 2022-03-11 ENCOUNTER — Other Ambulatory Visit (HOSPITAL_COMMUNITY): Payer: Self-pay

## 2022-03-14 ENCOUNTER — Ambulatory Visit (HOSPITAL_COMMUNITY)
Admission: RE | Admit: 2022-03-14 | Discharge: 2022-03-14 | Disposition: A | Discharge status: Home / Self Care | Payer: 59 | Source: Ambulatory Visit | Attending: Sports Medicine | Admitting: Sports Medicine

## 2022-03-14 DIAGNOSIS — Z298 Encounter for other specified prophylactic measures: Secondary | ICD-10-CM | POA: Insufficient documentation

## 2022-03-14 DIAGNOSIS — Z7983 Long term (current) use of bisphosphonates: Secondary | ICD-10-CM | POA: Insufficient documentation

## 2022-03-14 MED ORDER — ZOLEDRONIC ACID 5 MG/100ML IV SOLN
INTRAVENOUS | Status: AC
  Administered 2022-03-14: 5 mg via INTRAVENOUS
  Filled 2022-03-14: qty 100

## 2022-03-14 MED ORDER — ZOLEDRONIC ACID 5 MG/100ML IV SOLN: 5.0000 mg | Freq: Once | INTRAVENOUS | Status: AC

## 2022-03-21 ENCOUNTER — Other Ambulatory Visit: Payer: Self-pay | Admitting: Family

## 2022-03-21 DIAGNOSIS — I10 Essential (primary) hypertension: Secondary | ICD-10-CM

## 2022-04-03 IMAGING — DX DG CHEST 2V
2 series · 2 of 2 positions shown · non-contrast
Comparison: None.

CLINICAL DATA: Chest pain.

EXAM:
CHEST - 2 VIEW

[chest pa]
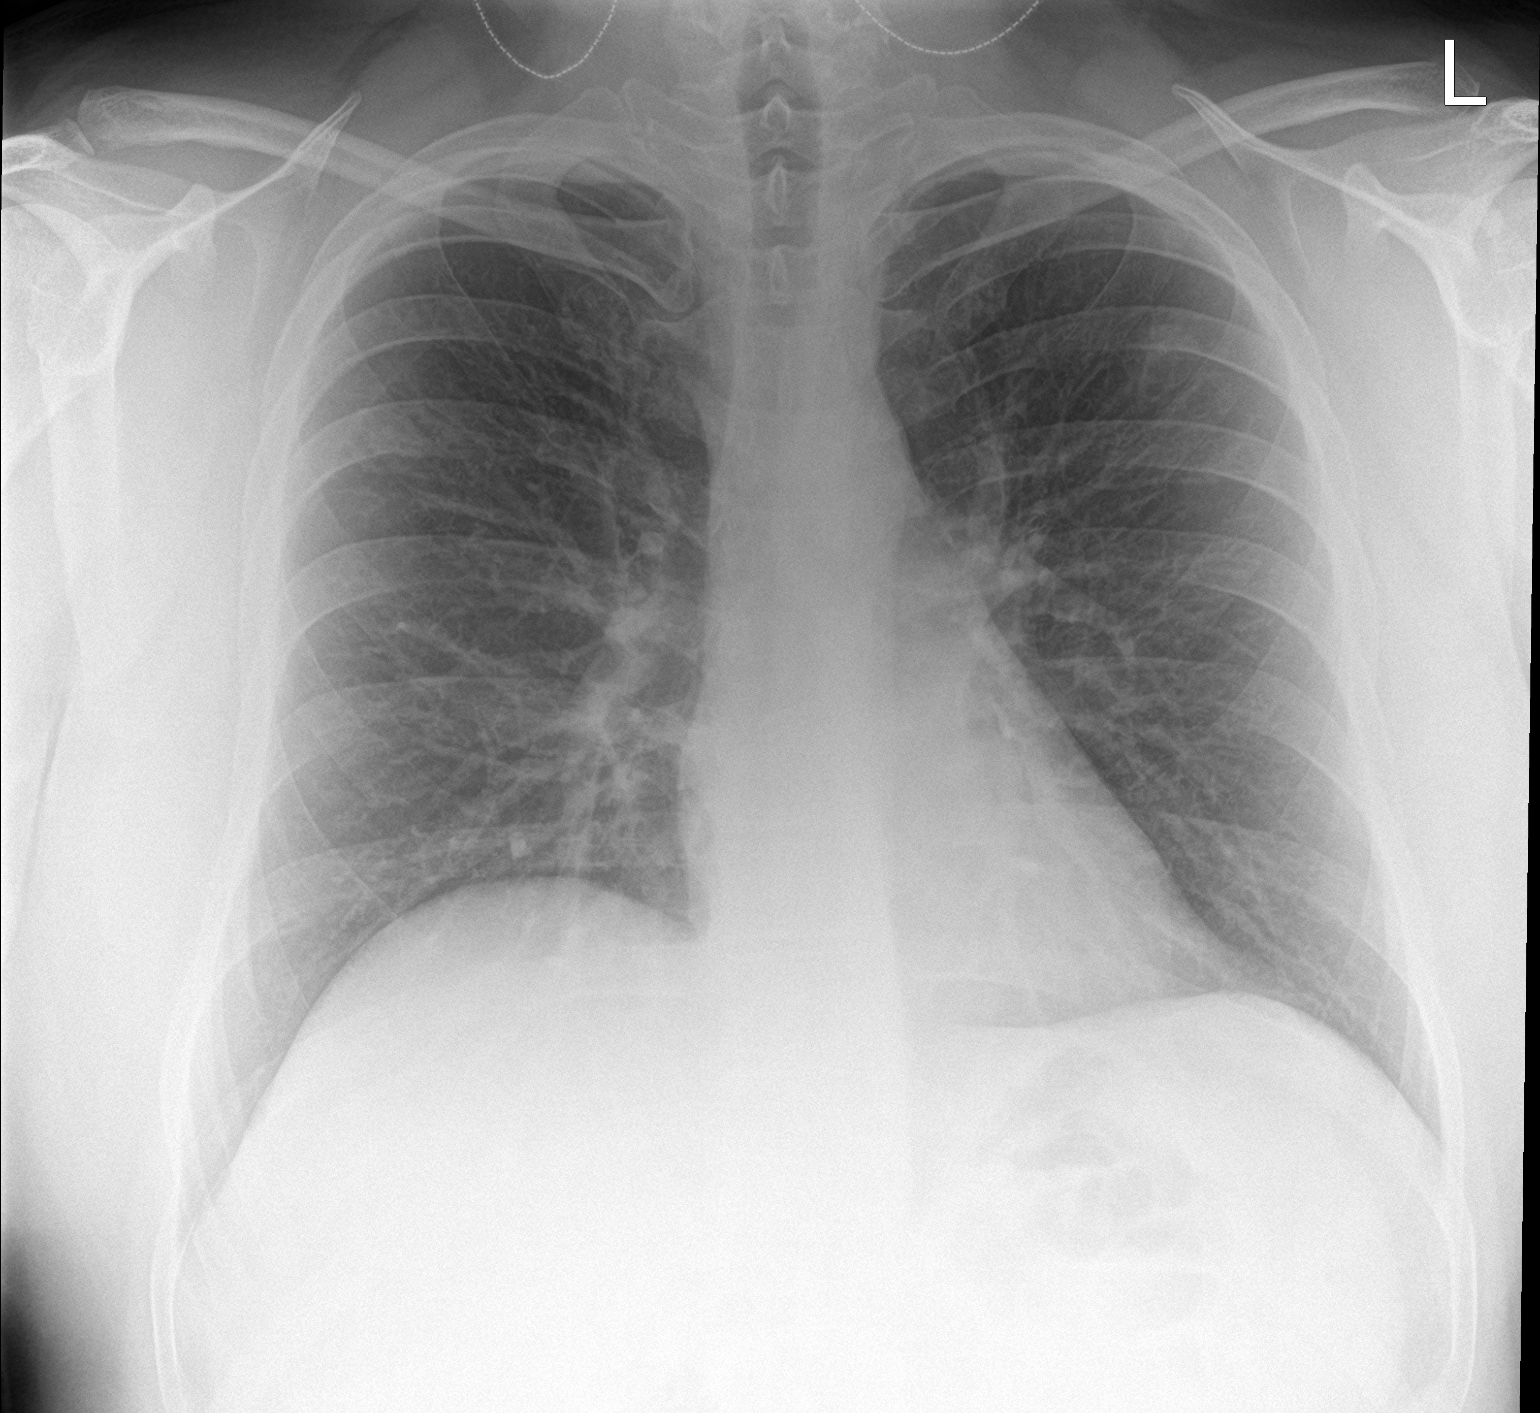

[chest lat]
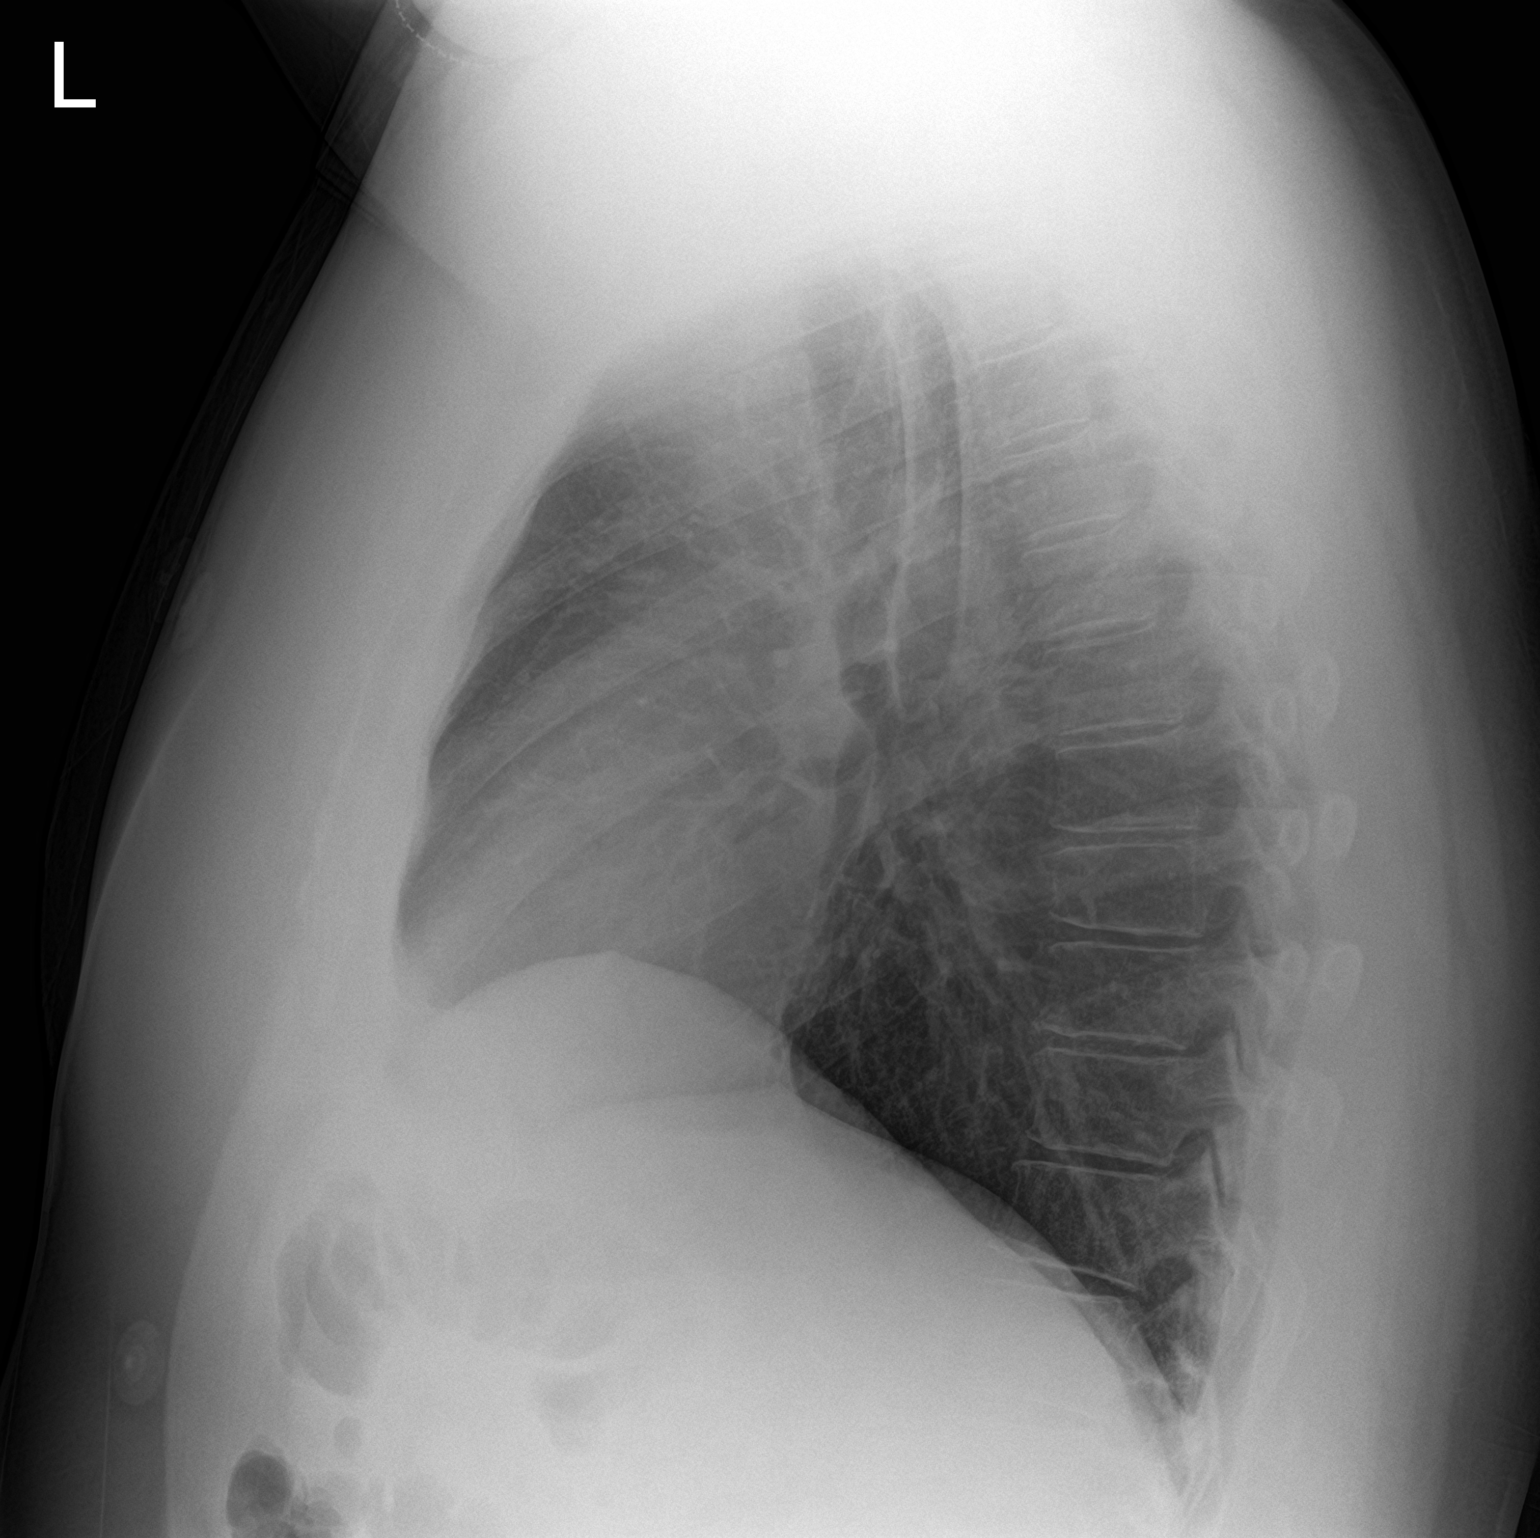

[2 of 2 positions shown; findings below may reference images not displayed]

FINDINGS: The heart size and mediastinal contours are within normal limits.
Both lungs are clear. No visible pleural effusions or pneumothorax.
No acute osseous abnormality.
IMPRESSION: No active cardiopulmonary disease.

## 2022-05-13 ENCOUNTER — Encounter: Payer: Self-pay | Admitting: Family

## 2022-05-13 ENCOUNTER — Ambulatory Visit (INDEPENDENT_AMBULATORY_CARE_PROVIDER_SITE_OTHER): Payer: 59 | Admitting: Family

## 2022-05-13 ENCOUNTER — Ambulatory Visit (INDEPENDENT_AMBULATORY_CARE_PROVIDER_SITE_OTHER): Payer: 59

## 2022-05-13 VITALS — BP 133/85 | HR 81 | Temp 97.9°F | Ht 72.0 in | Wt 238.0 lb

## 2022-05-13 DIAGNOSIS — I10 Essential (primary) hypertension: Secondary | ICD-10-CM | POA: Diagnosis not present

## 2022-05-13 DIAGNOSIS — E669 Obesity, unspecified: Secondary | ICD-10-CM

## 2022-05-13 DIAGNOSIS — Z01818 Encounter for other preprocedural examination: Secondary | ICD-10-CM | POA: Diagnosis not present

## 2022-05-13 DIAGNOSIS — G4733 Obstructive sleep apnea (adult) (pediatric): Secondary | ICD-10-CM

## 2022-05-13 DIAGNOSIS — F1010 Alcohol abuse, uncomplicated: Secondary | ICD-10-CM

## 2022-05-13 DIAGNOSIS — M459 Ankylosing spondylitis of unspecified sites in spine: Secondary | ICD-10-CM

## 2022-05-13 LAB — CBC WITH DIFFERENTIAL/PLATELET
Basophils Absolute: 0 10*3/uL (ref 0.0–0.2)
Basos: 0 %
EOS (ABSOLUTE): 0 10*3/uL (ref 0.0–0.4)
Eos: 0 %
Hematocrit: 43.9 % (ref 37.5–51.0)
Hemoglobin: 15.4 g/dL (ref 13.0–17.7)
Immature Grans (Abs): 0 10*3/uL (ref 0.0–0.1)
Immature Granulocytes: 0 %
Lymphocytes Absolute: 2 10*3/uL (ref 0.7–3.1)
Lymphs: 29 %
MCH: 34.8 pg — ABNORMAL HIGH (ref 26.6–33.0)
MCHC: 35.1 g/dL (ref 31.5–35.7)
MCV: 99 fL — ABNORMAL HIGH (ref 79–97)
Monocytes Absolute: 0.6 10*3/uL (ref 0.1–0.9)
Monocytes: 8 %
Neutrophils Absolute: 4.3 10*3/uL (ref 1.4–7.0)
Neutrophils: 63 %
Platelets: 230 10*3/uL (ref 150–450)
RBC: 4.42 x10E6/uL (ref 4.14–5.80)
RDW: 12.9 % (ref 11.6–15.4)
WBC: 6.9 10*3/uL (ref 3.4–10.8)

## 2022-05-13 LAB — CMP14+EGFR
ALT: 55 IU/L — ABNORMAL HIGH (ref 0–44)
AST: 51 IU/L — ABNORMAL HIGH (ref 0–40)
Albumin/Globulin Ratio: 1.6 (ref 1.2–2.2)
Albumin: 4.6 g/dL (ref 4.1–5.1)
Alkaline Phosphatase: 90 IU/L (ref 44–121)
BUN/Creatinine Ratio: 10 (ref 9–20)
BUN: 8 mg/dL (ref 6–20)
Bilirubin Total: 0.9 mg/dL (ref 0.0–1.2)
CO2: 21 mmol/L (ref 20–29)
Calcium: 9.3 mg/dL (ref 8.7–10.2)
Chloride: 102 mmol/L (ref 96–106)
Creatinine, Ser: 0.78 mg/dL (ref 0.76–1.27)
Globulin, Total: 2.8 g/dL (ref 1.5–4.5)
Glucose: 94 mg/dL (ref 70–99)
Potassium: 4.2 mmol/L (ref 3.5–5.2)
Sodium: 139 mmol/L (ref 134–144)
Total Protein: 7.4 g/dL (ref 6.0–8.5)
eGFR: 118 mL/min/{1.73_m2} (ref >59)

## 2022-05-13 NOTE — Progress Notes (Signed)
Subjective:    Patient ID: Dean Ferguson, male    DOB: 1986/05/03, 36 y.o.   MRN: [id number]  No chief complaint on file.  Pt presents to the office today for pre-op exam. He is scheduled to have left hip replacement. He has ankylosing spondylitis and is followed by Rheumatologists.   He has OSA and uses his CPAP 2-3 times a week.   Drinks two liquor beverages two to three times a week.  Hip Pain  The incident occurred more than 1 week ago. There was no injury mechanism. The pain is present in the right hip and left hip. The pain is at a severity of 9/10 (when walking, sitting 2). The pain is moderate. He reports no foreign bodies present. The symptoms are aggravated by movement and weight bearing. He has tried non-weight bearing for the symptoms. The treatment provided mild relief.      Review of Systems  All other systems reviewed and are negative.      Objective:   Physical Exam Vitals reviewed.  Constitutional:      General: He is not in acute distress.    Appearance: He is well-developed. He is obese.  HENT:     Head: Normocephalic.     Right Ear: Tympanic membrane normal.     Left Ear: Tympanic membrane normal.  Eyes:     General:        Right eye: No discharge.        Left eye: No discharge.     Pupils: Pupils are equal, round, and reactive to light.  Neck:     Thyroid: No thyromegaly.  Cardiovascular:     Rate and Rhythm: Normal rate and regular rhythm.     Heart sounds: Normal heart sounds. No murmur heard. Pulmonary:     Effort: Pulmonary effort is normal. No respiratory distress.     Breath sounds: Normal breath sounds. No wheezing.  Abdominal:     General: Bowel sounds are normal. There is no distension.     Palpations: Abdomen is soft.     Tenderness: There is no abdominal tenderness.  Musculoskeletal:        General: Tenderness present.     Cervical back: Normal range of motion and neck supple.     Comments: Pain in bilateral hips with internal  rotation and external rotation  Skin:    General: Skin is warm and dry.     Findings: No erythema or rash.  Neurological:     Mental Status: He is alert and oriented to person, place, and time.     Cranial Nerves: No cranial nerve deficit.     Deep Tendon Reflexes: Reflexes are normal and symmetric.  Psychiatric:        Behavior: Behavior normal.        Thought Content: Thought content normal.        Judgment: Judgment normal.       .BP 133/85   Pulse 81   Temp 97.9 F (36.6 C) (Temporal)   Ht 6' (1.829 m)   Wt 238 lb (108 kg)   SpO2 100%   BMI 32.28 kg/m      Assessment & Plan:   Dean Ferguson comes in today with chief complaint of No chief complaint on file.   Diagnosis and orders addressed:  1. Pre-op evaluation - EKG 12-Lead - CMP14+EGFR - CBC with Differential/Platelet - DG Chest 2 View  2. Essential hypertension - CMP14+EGFR - CBC with Differential/Platelet  3.  Ankylosing spondylitis, unspecified site of spine ([place]) - CMP14+EGFR - CBC with Differential/Platelet  4. Severe obstructive sleep apnea-hypopnea syndrome - CMP14+EGFR - CBC with Differential/Platelet  5. Alcohol abuse - CMP14+EGFR - CBC with Differential/Platelet  6. Obesity (BMI 30-39.9) - CMP14+EGFR - CBC with Differential/Platelet  7. OSA (obstructive sleep apnea) - CMP14+EGFR - CBC with Differential/Platelet   Labs pending Health Maintenance reviewed Diet and exercise encouraged  Follow up plan: Keep chronic follow up    Evelina Dun, FNP

## 2022-05-13 NOTE — Patient Instructions (Signed)
  Preparing for Hip Replacement Preparing before hip replacement surgery can make recovery easier and more comfortable. The information below gives some tips and guidelines that will help to prepare for this surgery. Talk with your health care provider so you can learn what to expect before, during, and after surgery. Ask questions if you do not understand something. Tell a health care provider about: Any allergies you have. All medicines you are taking, including vitamins, herbs, eye drops, creams, and over-the-counter medicines. Any problems you or family members have had with anesthetic medicines. Any blood disorders you have. Any surgeries you have had. Any medical conditions you have. Whether you are pregnant or may be pregnant. What happens before the procedure? Health care provider visits You will need to have a physical exam before you are scheduled for surgery (preoperative exam) to make sure it is safe for you to have surgery. This may require you to have more tests. When you go to the preoperative exam, bring a list of all the medicines, vitamins, herbs, and supplements that you take. Have dental care and routine cleanings done before surgery. Germs from anywhere in your body, including your mouth, can travel to your new joint and infect it. Tell your dentist that you plan to have hip replacement surgery. Surgery costs To find out how much surgery will cost, call your insurance company as soon as you decide to have surgery. Ask questions such as: How much of the surgery and hospital stay will be covered? What will be covered for the following items? Medical equipment. Rehabilitation facilities. Home care. Medicines. Preparing your home Pick a recovery spot that is not your bed. During recovery, it is better that you sit more upright than you can in your bed. Here are some tips for choosing a recovery spot: Choose a chair with arms and a high, firm seat that will not allow you  to sink down into it. Chairs and sofas that are too soft can allow your hip to bend at an angle greater than 90 degrees. This could put you at risk for moving your new hip joint out of place (dislocating it). Place items that you often use on a small table next to your chair. These items may include the TV remote control, a mobile phone, a book, a laptop computer, and a water glass. You may be given a walker to use at home. Check if you will have enough room to use a walker. Move around your home with your hands out about 6 inches (15 cm) from your sides. You will have enough room if you do not hit anything with your hands as you do this. Walk from: Your recovery spot to your kitchen and bathroom. Your bed to the bathroom. Apply these general tips: Move the items you use most often to shelves and drawers at countertop height. Do this in your kitchen, bathroom, and bedroom. Prepare some meals to freeze and reheat later. Home safety     Remove all clutter and throw rugs from your floors. Doing this will help you avoid tripping. Consider getting safety equipment that will be helpful during your recovery, such as: Grab bars in the shower and near the toilet. A raised toilet seat. This will help you get on and off the toilet more easily. A tub or shower bench. Preparing your body If you smoke, quit as soon as you can before surgery. If there is time, it is best to quit several months before surgery. Tell your   surgeon if you use any products that contain nicotine or tobacco, such as cigarettes, e-cigarettes, and chewing tobacco. These can delay healing. If you need help quitting, ask your health care provider. Maintain a healthy diet. Do not change your diet before surgery unless your health care provider tells you to do that. Do not drink any alcohol for at least 48 hours before surgery. Talk to your health care provider about doing exercises before your surgery. Be sure to follow the exercise  program given by your health care provider. Doing these exercises in the weeks before your surgery may help reduce pain and improve function after surgery. Recovery planning In the first couple of weeks after surgery, it will be harder for you to do some of your regular activities. You may get tired easily, and you may have limited movement in your leg. To make sure you have all the help you need after your surgery: Plan to have a responsible adult take you home from the hospital. Your health care provider will tell you how many days you can expect to be in the hospital. Cancel all of your work, caregiving, and volunteer responsibilities for at least 4-6 weeks after surgery. Plan to have a responsible adult stay with you both day and night for the first week. This person should be someone you are comfortable with. You may need this person to help you with your exercises and personal care, such as bathing and using the toilet. If you live alone, arrange for someone to take care of your home and pets for the first 4-6 weeks after surgery. You may not be able to drive for 4-6 weeks. Plan to have someone drive you on errands and to appointments. Consider applying for a disability parking permit. To get an application, call your local Department of Motor Vehicles ([employer]V) or your health care provider's office. Summary Getting prepared before hip replacement surgery can make your recovery easier and more comfortable. Keep all of your preoperative appointments to make sure that you are ready for your surgery. Prepare your home and arrange for help at home. Plan to have a responsible adult take you home from the hospital and stay with you day and night for the first week. This information is not intended to replace advice given to you by your health care provider. Make sure you discuss any questions you have with your health care provider. Document Revised: 02/06/2020 Document Reviewed: 02/06/2020 Elsevier  Patient Education  2023 [employer]  

## 2022-05-17 ENCOUNTER — Telehealth: Payer: Self-pay | Admitting: Family

## 2022-05-17 NOTE — Telephone Encounter (Signed)
Re faxed- patient aware  

## 2022-06-02 NOTE — Patient Instructions (Signed)
SURGICAL WAITING ROOM VISITATION Patients having surgery or a procedure may have no more than 2 support people in the waiting area - these visitors may rotate.   Children under the age of 29 must have an adult with them who is not the patient. If the patient needs to stay at the hospital during part of their recovery, the visitor guidelines for inpatient rooms apply. Pre-op nurse will coordinate an appropriate time for 1 support person to accompany patient in pre-op.  This support person may not rotate.    Please refer to the [facility] website for the visitor guidelines for Inpatients (after your surgery is over and you are in a regular room).       Your procedure is scheduled on:  06/22/22    Report to [facility] Main Entrance    Report to admitting at  1030 AM   Call this number if you have problems the morning of surgery [phone]   Do not eat food :After Midnight.   After Midnight you may have the following liquids until ___ 1010___ AM  DAY OF SURGERY  Water Non-Citrus Juices (without pulp, NO RED) Carbonated Beverages Black Coffee (NO MILK/CREAM OR CREAMERS, sugar ok)  Clear Tea (NO MILK/CREAM OR CREAMERS, sugar ok) regular and decaf                             Plain Jell-O (NO RED)                                           Fruit ices (not with fruit pulp, NO RED)                                     Popsicles (NO RED)                                                               Sports drinks like Gatorade (NO RED)                    The day of surgery:  Drink ONE (1) Pre-Surgery Clear Ensure or G2 at AM the morning of surgery. Drink in one sitting. Do not sip.  This drink was given to you during your hospital  pre-op appointment visit. Nothing else to drink after completing the  Pre-Surgery Clear Ensure or G2.     Oral Hygiene is also important to reduce your risk of infection.                                    Remember - BRUSH YOUR TEETH THE MORNING  OF SURGERY WITH YOUR REGULAR TOOTHPASTE   Do NOT smoke after Midnight   Take these medicines the morning of surgery with A SIP OF WATER: PRistiq   DO NOT TAKE ANY ORAL DIABETIC MEDICATIONS DAY OF YOUR SURGERY  Bring CPAP mask and tubing day of surgery.  You may not have any metal on your body including hair pins, jewelry, and body piercing             Do not wear make-up, lotions, powders, perfumes/cologne, or deodorant  Do not wear nail polish including gel and S&S, artificial/acrylic nails, or any other type of covering on natural nails including finger and toenails. If you have artificial nails, gel coating, etc. that needs to be removed by a nail salon please have this removed prior to surgery or surgery may need to be canceled/ delayed if the surgeon/ anesthesia feels like they are unable to be safely monitored.   Do not shave  48 hours prior to surgery.               Men may shave face and neck.   Do not bring valuables to the hospital. [facility] IS NOT             RESPONSIBLE   FOR VALUABLES.   Contacts, dentures or bridgework may not be worn into surgery.   Bring small overnight bag day of surgery.   DO NOT BRING YOUR HOME MEDICATIONS TO THE HOSPITAL. PHARMACY WILL DISPENSE MEDICATIONS LISTED ON YOUR MEDICATION LIST TO YOU DURING YOUR ADMISSION IN THE HOSPITAL!    Patients discharged on the day of surgery will not be allowed to drive home.  Someone NEEDS to stay with you for the first 24 hours after anesthesia.   Special Instructions: Bring a copy of your healthcare power of attorney and living will documents         the day of surgery if you haven't scanned them before.              Please read over the following fact sheets you were given: IF YOU HAVE QUESTIONS ABOUT YOUR PRE-OP INSTRUCTIONS PLEASE CALL [phone]     [facility] - Preparing for Surgery Before surgery, you can play an important role.  Because skin is not sterile, your  skin needs to be as free of germs as possible.  You can reduce the number of germs on your skin by washing with CHG (chlorahexidine gluconate) soap before surgery.  CHG is an antiseptic cleaner which kills germs and bonds with the skin to continue killing germs even after washing. Please DO NOT use if you have an allergy to CHG or antibacterial soaps.  If your skin becomes reddened/irritated stop using the CHG and inform your nurse when you arrive at Short Stay. Do not shave (including legs and underarms) for at least 48 hours prior to the first CHG shower.  You may shave your face/neck. Please follow these instructions carefully:  1.  Shower with CHG Soap the night before surgery and the  morning of Surgery.  2.  If you choose to wash your hair, wash your hair first as usual with your  normal  shampoo.  3.  After you shampoo, rinse your hair and body thoroughly to remove the  shampoo.                           4.  Use CHG as you would any other liquid soap.  You can apply chg directly  to the skin and wash                       Gently with a scrungie or clean washcloth.  5.  Apply the CHG Soap to your  body ONLY FROM THE NECK DOWN.   Do not use on face/ open                           Wound or open sores. Avoid contact with eyes, ears mouth and genitals (private parts).                       Wash face,  Genitals (private parts) with your normal soap.             6.  Wash thoroughly, paying special attention to the area where your surgery  will be performed.  7.  Thoroughly rinse your body with warm water from the neck down.  8.  DO NOT shower/wash with your normal soap after using and rinsing off  the CHG Soap.                9.  Pat yourself dry with a clean towel.            10.  Wear clean pajamas.            11.  Place clean sheets on your bed the night of your first shower and do not  sleep with pets. Day of Surgery : Do not apply any lotions/deodorants the morning of surgery.  Please wear  clean clothes to the hospital/surgery center.  FAILURE TO FOLLOW THESE INSTRUCTIONS MAY RESULT IN THE CANCELLATION OF YOUR SURGERY PATIENT SIGNATURE_________________________________  NURSE SIGNATURE__________________________________  ________________________________________________________________________

## 2022-06-02 NOTE — Progress Notes (Addendum)
Anesthesia Review:  PCP: Jannifer Rodney, NP preop eval on 05/13/22.  Cardiologist : none  Chest x-ray : EKG : 05/13/22  Echo : Stress test: Cardiac Cath :  Activity level: can do a flight of stairs without difficutly Sleep Study/ CPAP : has cpap  Fasting Blood Sugar :      / Checks Blood Sugar -- times a day:   Blood Thinner/ Instructions /Last Dose: ASA / Instructions/ Last Dose :    Hx of marijuana once per week per pt.  INstructed pt at time of preop appt no marijuana use at least 24 hours prior to surgery.  PT voiced understanding.

## 2022-06-07 NOTE — Progress Notes (Signed)
Sent message, via epic in basket, requesting orders in epic from surgeon.  

## 2022-06-09 ENCOUNTER — Ambulatory Visit: Payer: Self-pay | Admitting: Physician Assistant

## 2022-06-09 DIAGNOSIS — G8929 Other chronic pain: Secondary | ICD-10-CM

## 2022-06-09 NOTE — H&P (Signed)
TOTAL HIP ADMISSION H&P  Patient is admitted for left total hip arthroplasty.  Subjective:  Chief Complaint: left hip pain  HPI: Dean Ferguson, 36 y.o. male, has a history of pain and functional disability in the left hip(s) due to arthritis and patient has failed non-surgical conservative treatments for greater than 12 weeks to include NSAID's and/or analgesics and activity modification.  Onset of symptoms was gradual starting 5 years ago with gradually worsening course since that time.The patient noted no past surgery on the left hip(s).  Patient currently rates pain in the left hip at 7 out of 10 with activity. Patient has night pain, worsening of pain with activity and weight bearing, trendelenberg gait, pain that interfers with activities of daily living, and pain with passive range of motion. Patient has evidence of periarticular osteophytes and joint space narrowing by imaging studies. This condition presents safety issues increasing the risk of falls. This patient has had avascular necrosis of the hip, acetabular fracture, hip dysplasia.  There is no current active infection.  Patient Active Problem List   Diagnosis Date Noted   Ankylosing spondylitis ([place]) 02/24/2022   GAD (generalized anxiety disorder) 02/03/2020   Obesity (BMI 30-39.9) 02/03/2020   Alcohol abuse 02/03/2020   Elevated liver enzymes 02/03/2020   Skin tag 10/15/2019   Fatty liver 07/15/2019   HLA B27 (HLA B27 positive) 12/28/2018   Iritis of left eye 12/28/2018   Chronic back pain 12/28/2018   Severe obstructive sleep apnea-hypopnea syndrome 10/05/2018   Essential hypertension 10/01/2018   OSA (obstructive sleep apnea) 07/23/2018   Psychophysiological insomnia 07/23/2018   Depression, major, single episode, mild ([place]) 06/24/2018   Gout 05/28/2018   Snoring 05/24/2018   Past Medical History:  Diagnosis Date   Anxiety    Fatty liver 07/15/2019   GERD (gastroesophageal reflux disease)     Past Surgical  History:  Procedure Laterality Date   LEG SURGERY Right    FELL THROUGH GLASS   SHOULDER SURGERY Right    FELL THROUGH GLASS    Current Outpatient Medications  Medication Sig Dispense Refill Last Dose   ALPRAZolam (XANAX) 0.25 MG tablet Take 1 tablet (0.25 mg total) by mouth at bedtime as needed for anxiety. (Patient taking differently: Take 0.25 mg by mouth daily as needed (anxiety during a flight).) 6 tablet 0    Calcium Carb-Cholecalciferol (CALCIUM + D3 PO) Take 1 tablet by mouth daily.      desvenlafaxine (PRISTIQ) 100 MG 24 hr tablet TAKE 1 TABLET (100 MG TOTAL) BY MOUTH DAILY. (NEEDS TO BE SEEN BEFORE NEXT REFILL) 90 tablet 1    HUMIRA PEN 40 MG/0.4ML PNKT Inject 40 mg into the skin every 14 (fourteen) days.      losartan (COZAAR) 50 MG tablet Take 1 tablet (50 mg total) by mouth daily. 90 tablet 1    rosuvastatin (CRESTOR) 5 MG tablet Take 1 tablet (5 mg total) by mouth daily. 90 tablet 3    Vitamin D, Ergocalciferol, (DRISDOL) 1.25 MG (50000 UNIT) CAPS capsule TAKE 1 CAPSULE (50,000 UNITS TOTAL) BY MOUTH EVERY 7 (SEVEN) DAYS (Patient not taking: Reported on 06/08/2022) 12 capsule 3    zolpidem (AMBIEN) 10 MG tablet Take 1 tablet (10 mg total) by mouth at bedtime as needed. for sleep 30 tablet 3    No current facility-administered medications for this visit.   Allergies  Allergen Reactions   Other Rash    Metal   Skelaxin [Metaxalone] Swelling and Rash    Social History  Tobacco Use   Smoking status: Never   Smokeless tobacco: Former    Types: Chew    Quit date: 09/26/2004  Substance Use Topics   Alcohol use: Yes    Comment: 3-4 liquor daily    Family History  Problem Relation Age of Onset   Immunodeficiency Mother    Hyperlipidemia Father    Hypertension Father    Liver disease Paternal Grandfather        etoh   Liver disease Paternal Aunt    Colon cancer Neg Hx      Review of Systems  Musculoskeletal:  Positive for arthralgias.  All other systems reviewed and  are negative.   Objective:  Physical Exam Constitutional:      General: He is not in acute distress.    Appearance: Normal appearance.  HENT:     Head: Atraumatic.  Eyes:     Extraocular Movements: Extraocular movements intact.     Pupils: Pupils are equal, round, and reactive to light.  Cardiovascular:     Rate and Rhythm: Normal rate and regular rhythm.     Pulses: Normal pulses.     Heart sounds: Normal heart sounds. No murmur heard. Pulmonary:     Effort: Pulmonary effort is normal. No respiratory distress.     Breath sounds: Normal breath sounds. No wheezing.  Abdominal:     General: Abdomen is flat. Bowel sounds are normal. There is no distension.     Palpations: Abdomen is soft.     Tenderness: There is no abdominal tenderness.  Musculoskeletal:     Cervical back: Normal range of motion and neck supple.     Comments: Examination of the left lower extremity again shows he is neurovascularly intact.  He has intact dorsiflexion and plantarflexion with the ankle.  There is no calf tenderness to palpation.  Painful arc of motion and limited range of motion with the left hip.  Antalgic gait favoring the left hip.    Lymphadenopathy:     Cervical: No cervical adenopathy.  Skin:    General: Skin is warm and dry.     Findings: No erythema or rash.  Neurological:     General: No focal deficit present.     Mental Status: He is alert and oriented to person, place, and time.  Psychiatric:        Mood and Affect: Mood normal.        Behavior: Behavior normal.     Vital signs in last 24 hours: '@VSRANGES' @  Labs:   Estimated body mass index is 32.28 kg/m as calculated from the following:   Height as of 05/13/22: 6' (1.829 m).   Weight as of 05/13/22: 108 kg.   Imaging Review Plain radiographs demonstrate moderate degenerative joint disease of the left hip(s). The bone quality appears to be good for age and reported activity level.      Assessment/Plan:  End stage  arthritis, left hip(s)  The patient history, physical examination, clinical judgement of the provider and imaging studies are consistent with end stage degenerative joint disease of the left hip(s) and total hip arthroplasty is deemed medically necessary. The treatment options including medical management, injection therapy, arthroscopy and arthroplasty were discussed at length. The risks and benefits of total hip arthroplasty were presented and reviewed. The risks due to aseptic loosening, infection, stiffness, dislocation/subluxation,  thromboembolic complications and other imponderables were discussed.  The patient acknowledged the explanation, agreed to proceed with the plan and consent was signed. Patient is  being admitted for inpatient treatment for surgery, pain control, PT, OT, prophylactic antibiotics, VTE prophylaxis, progressive ambulation and ADL's and discharge planning.The patient is planning to be discharged  home with outpt PT.    Patient's anticipated LOS is less than 2 midnights, meeting these requirements: - Younger than 70 - Lives within 1 hour of care - Has a competent adult at home to recover with post-op recover - NO history of  - Chronic pain requiring opiods  - Diabetes  - Coronary Artery Disease  - Heart failure  - Heart attack  - Stroke  - DVT/VTE  - Cardiac arrhythmia  - Respiratory Failure/COPD  - Renal failure  - Anemia  - Advanced Liver disease

## 2022-06-09 NOTE — H&P (View-Only) (Signed)
TOTAL HIP ADMISSION H&P  Patient is admitted for left total hip arthroplasty.  Subjective:  Chief Complaint: left hip pain  HPI: Dean Ferguson, 36 y.o. male, has a history of pain and functional disability in the left hip(s) due to arthritis and patient has failed non-surgical conservative treatments for greater than 12 weeks to include NSAID's and/or analgesics and activity modification.  Onset of symptoms was gradual starting 5 years ago with gradually worsening course since that time.The patient noted no past surgery on the left hip(s).  Patient currently rates pain in the left hip at 7 out of 10 with activity. Patient has night pain, worsening of pain with activity and weight bearing, trendelenberg gait, pain that interfers with activities of daily living, and pain with passive range of motion. Patient has evidence of periarticular osteophytes and joint space narrowing by imaging studies. This condition presents safety issues increasing the risk of falls. This patient has had avascular necrosis of the hip, acetabular fracture, hip dysplasia.  There is no current active infection.  Patient Active Problem List   Diagnosis Date Noted   Ankylosing spondylitis ([place]) 02/24/2022   GAD (generalized anxiety disorder) 02/03/2020   Obesity (BMI 30-39.9) 02/03/2020   Alcohol abuse 02/03/2020   Elevated liver enzymes 02/03/2020   Skin tag 10/15/2019   Fatty liver 07/15/2019   HLA B27 (HLA B27 positive) 12/28/2018   Iritis of left eye 12/28/2018   Chronic back pain 12/28/2018   Severe obstructive sleep apnea-hypopnea syndrome 10/05/2018   Essential hypertension 10/01/2018   OSA (obstructive sleep apnea) 07/23/2018   Psychophysiological insomnia 07/23/2018   Depression, major, single episode, mild ([place]) 06/24/2018   Gout 05/28/2018   Snoring 05/24/2018   Past Medical History:  Diagnosis Date   Anxiety    Fatty liver 07/15/2019   GERD (gastroesophageal reflux disease)     Past Surgical  History:  Procedure Laterality Date   LEG SURGERY Right    FELL THROUGH GLASS   SHOULDER SURGERY Right    FELL THROUGH GLASS    Current Outpatient Medications  Medication Sig Dispense Refill Last Dose   ALPRAZolam (XANAX) 0.25 MG tablet Take 1 tablet (0.25 mg total) by mouth at bedtime as needed for anxiety. (Patient taking differently: Take 0.25 mg by mouth daily as needed (anxiety during a flight).) 6 tablet 0    Calcium Carb-Cholecalciferol (CALCIUM + D3 PO) Take 1 tablet by mouth daily.      desvenlafaxine (PRISTIQ) 100 MG 24 hr tablet TAKE 1 TABLET (100 MG TOTAL) BY MOUTH DAILY. (NEEDS TO BE SEEN BEFORE NEXT REFILL) 90 tablet 1    HUMIRA PEN 40 MG/0.4ML PNKT Inject 40 mg into the skin every 14 (fourteen) days.      losartan (COZAAR) 50 MG tablet Take 1 tablet (50 mg total) by mouth daily. 90 tablet 1    rosuvastatin (CRESTOR) 5 MG tablet Take 1 tablet (5 mg total) by mouth daily. 90 tablet 3    Vitamin D, Ergocalciferol, (DRISDOL) 1.25 MG (50000 UNIT) CAPS capsule TAKE 1 CAPSULE (50,000 UNITS TOTAL) BY MOUTH EVERY 7 (SEVEN) DAYS (Patient not taking: Reported on 06/08/2022) 12 capsule 3    zolpidem (AMBIEN) 10 MG tablet Take 1 tablet (10 mg total) by mouth at bedtime as needed. for sleep 30 tablet 3    No current facility-administered medications for this visit.   Allergies  Allergen Reactions   Other Rash    Metal   Skelaxin [Metaxalone] Swelling and Rash    Social History  Tobacco Use   Smoking status: Never   Smokeless tobacco: Former    Types: Chew    Quit date: 09/26/2004  Substance Use Topics   Alcohol use: Yes    Comment: 3-4 liquor daily    Family History  Problem Relation Age of Onset   Immunodeficiency Mother    Hyperlipidemia Father    Hypertension Father    Liver disease Paternal Grandfather        etoh   Liver disease Paternal Aunt    Colon cancer Neg Hx      Review of Systems  Musculoskeletal:  Positive for arthralgias.  All other systems reviewed and  are negative.   Objective:  Physical Exam Constitutional:      General: He is not in acute distress.    Appearance: Normal appearance.  HENT:     Head: Atraumatic.  Eyes:     Extraocular Movements: Extraocular movements intact.     Pupils: Pupils are equal, round, and reactive to light.  Cardiovascular:     Rate and Rhythm: Normal rate and regular rhythm.     Pulses: Normal pulses.     Heart sounds: Normal heart sounds. No murmur heard. Pulmonary:     Effort: Pulmonary effort is normal. No respiratory distress.     Breath sounds: Normal breath sounds. No wheezing.  Abdominal:     General: Abdomen is flat. Bowel sounds are normal. There is no distension.     Palpations: Abdomen is soft.     Tenderness: There is no abdominal tenderness.  Musculoskeletal:     Cervical back: Normal range of motion and neck supple.     Comments: Examination of the left lower extremity again shows he is neurovascularly intact.  He has intact dorsiflexion and plantarflexion with the ankle.  There is no calf tenderness to palpation.  Painful arc of motion and limited range of motion with the left hip.  Antalgic gait favoring the left hip.    Lymphadenopathy:     Cervical: No cervical adenopathy.  Skin:    General: Skin is warm and dry.     Findings: No erythema or rash.  Neurological:     General: No focal deficit present.     Mental Status: He is alert and oriented to person, place, and time.  Psychiatric:        Mood and Affect: Mood normal.        Behavior: Behavior normal.     Vital signs in last 24 hours: '@VSRANGES' @  Labs:   Estimated body mass index is 32.28 kg/m as calculated from the following:   Height as of 05/13/22: 6' (1.829 m).   Weight as of 05/13/22: 108 kg.   Imaging Review Plain radiographs demonstrate moderate degenerative joint disease of the left hip(s). The bone quality appears to be good for age and reported activity level.      Assessment/Plan:  End stage  arthritis, left hip(s)  The patient history, physical examination, clinical judgement of the provider and imaging studies are consistent with end stage degenerative joint disease of the left hip(s) and total hip arthroplasty is deemed medically necessary. The treatment options including medical management, injection therapy, arthroscopy and arthroplasty were discussed at length. The risks and benefits of total hip arthroplasty were presented and reviewed. The risks due to aseptic loosening, infection, stiffness, dislocation/subluxation,  thromboembolic complications and other imponderables were discussed.  The patient acknowledged the explanation, agreed to proceed with the plan and consent was signed. Patient is  being admitted for inpatient treatment for surgery, pain control, PT, OT, prophylactic antibiotics, VTE prophylaxis, progressive ambulation and ADL's and discharge planning.The patient is planning to be discharged  home with outpt PT.    Patient's anticipated LOS is less than 2 midnights, meeting these requirements: - Younger than 28 - Lives within 1 hour of care - Has a competent adult at home to recover with post-op recover - NO history of  - Chronic pain requiring opiods  - Diabetes  - Coronary Artery Disease  - Heart failure  - Heart attack  - Stroke  - DVT/VTE  - Cardiac arrhythmia  - Respiratory Failure/COPD  - Renal failure  - Anemia  - Advanced Liver disease

## 2022-06-13 ENCOUNTER — Encounter (HOSPITAL_COMMUNITY): Payer: Self-pay

## 2022-06-13 ENCOUNTER — Other Ambulatory Visit: Payer: Self-pay

## 2022-06-13 ENCOUNTER — Encounter (HOSPITAL_COMMUNITY)
Admission: RE | Admit: 2022-06-13 | Discharge: 2022-06-13 | Disposition: A | Discharge status: Home / Self Care | Payer: 59 | Source: Ambulatory Visit | Attending: Orthopedic Surgery | Admitting: Orthopedic Surgery

## 2022-06-13 VITALS — BP 143/92 | HR 81 | Temp 98.4°F | Resp 16 | Ht 72.0 in | Wt 223.0 lb

## 2022-06-13 DIAGNOSIS — Z01818 Encounter for other preprocedural examination: Secondary | ICD-10-CM

## 2022-06-13 DIAGNOSIS — Z01812 Encounter for preprocedural laboratory examination: Secondary | ICD-10-CM | POA: Insufficient documentation

## 2022-06-13 DIAGNOSIS — G8929 Other chronic pain: Secondary | ICD-10-CM

## 2022-06-13 DIAGNOSIS — M25552 Pain in left hip: Secondary | ICD-10-CM | POA: Diagnosis not present

## 2022-06-13 HISTORY — DX: Sleep apnea, unspecified: G47.30

## 2022-06-13 HISTORY — DX: Unspecified osteoarthritis, unspecified site: M19.90

## 2022-06-13 HISTORY — DX: Personal history of urinary calculi: Z87.442

## 2022-06-13 LAB — COMPREHENSIVE METABOLIC PANEL
ALT: 75 U/L — ABNORMAL HIGH (ref 0–44)
AST: 62 U/L — ABNORMAL HIGH (ref 15–41)
Albumin: 5 g/dL (ref 3.5–5.0)
Alkaline Phosphatase: 73 U/L (ref 38–126)
Anion gap: 9 (ref 5–15)
BUN: 8 mg/dL (ref 6–20)
CO2: 25 mmol/L (ref 22–32)
Calcium: 9.5 mg/dL (ref 8.9–10.3)
Chloride: 105 mmol/L (ref 98–111)
Creatinine, Ser: 0.69 mg/dL (ref 0.61–1.24)
GFR, Estimated: >60 mL/min (ref >60)
Glucose, Bld: 96 mg/dL (ref 70–99)
Potassium: 3.8 mmol/L (ref 3.5–5.1)
Sodium: 139 mmol/L (ref 135–145)
Total Bilirubin: 1.1 mg/dL (ref 0.3–1.2)
Total Protein: 8.2 g/dL — ABNORMAL HIGH (ref 6.5–8.1)

## 2022-06-13 LAB — CBC WITH DIFFERENTIAL/PLATELET
Abs Immature Granulocytes: 0.03 10*3/uL (ref 0.00–0.07)
Basophils Absolute: 0 10*3/uL (ref 0.0–0.1)
Basophils Relative: 1 %
Eosinophils Absolute: 0 10*3/uL (ref 0.0–0.5)
Eosinophils Relative: 0 %
HCT: 45.8 % (ref 39.0–52.0)
Hemoglobin: 16.2 g/dL (ref 13.0–17.0)
Immature Granulocytes: 0 %
Lymphocytes Relative: 20 %
Lymphs Abs: 1.7 10*3/uL (ref 0.7–4.0)
MCH: 34.7 pg — ABNORMAL HIGH (ref 26.0–34.0)
MCHC: 35.4 g/dL (ref 30.0–36.0)
MCV: 98.1 fL (ref 80.0–100.0)
Monocytes Absolute: 0.6 10*3/uL (ref 0.1–1.0)
Monocytes Relative: 7 %
Neutro Abs: 6.1 10*3/uL (ref 1.7–7.7)
Neutrophils Relative %: 72 %
Platelets: 235 10*3/uL (ref 150–400)
RBC: 4.67 MIL/uL (ref 4.22–5.81)
RDW: 12.3 % (ref 11.5–15.5)
WBC: 8.5 10*3/uL (ref 4.0–10.5)
nRBC: 0 % (ref 0.0–0.2)

## 2022-06-13 LAB — SURGICAL PCR SCREEN
MRSA, PCR: NEGATIVE
Staphylococcus aureus: NEGATIVE

## 2022-06-21 MED ORDER — TRANEXAMIC ACID 1000 MG/10ML IV SOLN
2000.0000 mg | INTRAVENOUS | Status: DC
  Filled 2022-06-21: qty 20

## 2022-06-21 NOTE — Anesthesia Preprocedure Evaluation (Signed)
Anesthesia Evaluation  Patient identified by MRN, date of birth, ID band Patient awake    Reviewed: Allergy & Precautions, NPO status , Patient's Chart, lab work & pertinent test results  Airway Mallampati: II  TM Distance: >3 FB     Dental   Pulmonary sleep apnea ,    breath sounds clear to auscultation       Cardiovascular hypertension,  Rhythm:Regular Rate:Normal     Neuro/Psych PSYCHIATRIC DISORDERS    GI/Hepatic Neg liver ROS, GERD  ,  Endo/Other  negative endocrine ROS  Renal/GU negative Renal ROS     Musculoskeletal  (+) Arthritis ,   Abdominal   Peds  Hematology   Anesthesia Other Findings   Reproductive/Obstetrics                           Anesthesia Physical Anesthesia Plan  ASA: 3  Anesthesia Plan: Spinal   Post-op Pain Management:    Induction: Intravenous  PONV Risk Score and Plan: 2 and Treatment may vary due to age or medical condition and Ondansetron  Airway Management Planned: Nasal Cannula and Simple Face Mask  Additional Equipment:   Intra-op Plan:   Post-operative Plan:   Informed Consent:     Dental advisory given  Plan Discussed with: Anesthesiologist and CRNA  Anesthesia Plan Comments:      Anesthesia Quick Evaluation

## 2022-06-22 ENCOUNTER — Ambulatory Visit (HOSPITAL_COMMUNITY): Payer: 59 | Admitting: Physician Assistant

## 2022-06-22 ENCOUNTER — Ambulatory Visit (HOSPITAL_COMMUNITY)
Admission: RE | Admit: 2022-06-22 | Discharge: 2022-06-22 | Disposition: A | Discharge status: Home / Self Care | Payer: 59 | Attending: Orthopedic Surgery | Admitting: Orthopedic Surgery

## 2022-06-22 ENCOUNTER — Ambulatory Visit (HOSPITAL_COMMUNITY): Payer: 59

## 2022-06-22 ENCOUNTER — Encounter (HOSPITAL_COMMUNITY)
Admission: RE | Disposition: A | Discharge status: Home / Self Care | Payer: Self-pay | Source: Home / Self Care | Attending: Orthopedic Surgery

## 2022-06-22 ENCOUNTER — Other Ambulatory Visit: Payer: Self-pay

## 2022-06-22 ENCOUNTER — Encounter (HOSPITAL_COMMUNITY): Payer: Self-pay | Admitting: Orthopedic Surgery

## 2022-06-22 ENCOUNTER — Ambulatory Visit (HOSPITAL_BASED_OUTPATIENT_CLINIC_OR_DEPARTMENT_OTHER): Payer: 59 | Admitting: Physician Assistant

## 2022-06-22 DIAGNOSIS — M1612 Unilateral primary osteoarthritis, left hip: Secondary | ICD-10-CM | POA: Diagnosis not present

## 2022-06-22 DIAGNOSIS — M87852 Other osteonecrosis, left femur: Secondary | ICD-10-CM

## 2022-06-22 DIAGNOSIS — Z01818 Encounter for other preprocedural examination: Secondary | ICD-10-CM

## 2022-06-22 DIAGNOSIS — I10 Essential (primary) hypertension: Secondary | ICD-10-CM

## 2022-06-22 DIAGNOSIS — G4733 Obstructive sleep apnea (adult) (pediatric): Secondary | ICD-10-CM | POA: Insufficient documentation

## 2022-06-22 DIAGNOSIS — G4730 Sleep apnea, unspecified: Secondary | ICD-10-CM

## 2022-06-22 DIAGNOSIS — M879 Osteonecrosis, unspecified: Secondary | ICD-10-CM | POA: Insufficient documentation

## 2022-06-22 DIAGNOSIS — Z87891 Personal history of nicotine dependence: Secondary | ICD-10-CM

## 2022-06-22 HISTORY — PX: TOTAL HIP ARTHROPLASTY: SHX124

## 2022-06-22 LAB — TYPE AND SCREEN
ABO/RH(D): O POS
Antibody Screen: NEGATIVE

## 2022-06-22 LAB — ABO/RH: ABO/RH(D): O POS

## 2022-06-22 SURGERY — ARTHROPLASTY, HIP, TOTAL,POSTERIOR APPROACH
Anesthesia: General | Site: Hip | Laterality: Left

## 2022-06-22 MED ORDER — OXYCODONE HCL 5 MG PO TABS
ORAL_TABLET | ORAL | Status: AC
  Filled 2022-06-22: qty 1

## 2022-06-22 MED ORDER — 0.9 % SODIUM CHLORIDE (POUR BTL) OPTIME
TOPICAL | Status: DC | PRN
  Administered 2022-06-22: 1000 mL

## 2022-06-22 MED ORDER — ISOPROPYL ALCOHOL 70 % SOLN
Status: AC
  Filled 2022-06-22: qty 480

## 2022-06-22 MED ORDER — SODIUM CHLORIDE 0.9 % IR SOLN
Status: DC | PRN
  Administered 2022-06-22 (×2): 1000 mL

## 2022-06-22 MED ORDER — METHOCARBAMOL 500 MG PO TABS: 500.0000 mg | ORAL_TABLET | Freq: Four times a day (QID) | ORAL | Status: DC | PRN

## 2022-06-22 MED ORDER — METHOCARBAMOL 500 MG IVPB - SIMPLE MED
500.0000 mg | Freq: Four times a day (QID) | INTRAVENOUS | Status: DC | PRN
  Administered 2022-06-22: 500 mg via INTRAVENOUS

## 2022-06-22 MED ORDER — BUPIVACAINE LIPOSOME 1.3 % IJ SUSP
INTRAMUSCULAR | Status: AC
  Filled 2022-06-22: qty 20

## 2022-06-22 MED ORDER — LACTATED RINGERS IV BOLUS
500.0000 mL | Freq: Once | INTRAVENOUS | Status: AC
  Administered 2022-06-22: 500 mL via INTRAVENOUS

## 2022-06-22 MED ORDER — SODIUM CHLORIDE (PF) 0.9 % IJ SOLN
INTRAMUSCULAR | Status: AC
  Filled 2022-06-22: qty 50

## 2022-06-22 MED ORDER — WATER FOR IRRIGATION, STERILE IR SOLN
Status: DC | PRN
  Administered 2022-06-22: 2000 mL

## 2022-06-22 MED ORDER — PHENYLEPHRINE 80 MCG/ML (10ML) SYRINGE FOR IV PUSH (FOR BLOOD PRESSURE SUPPORT)
PREFILLED_SYRINGE | INTRAVENOUS | Status: AC
  Filled 2022-06-22: qty 10

## 2022-06-22 MED ORDER — PHENYLEPHRINE HCL-NACL 20-0.9 MG/250ML-% IV SOLN
INTRAVENOUS | Status: AC
  Filled 2022-06-22: qty 250

## 2022-06-22 MED ORDER — METHOCARBAMOL 500 MG PO TABS: 500.0000 mg | ORAL_TABLET | Freq: Three times a day (TID) | ORAL | 0 refills | Status: AC | PRN

## 2022-06-22 MED ORDER — LACTATED RINGERS IV SOLN: INTRAVENOUS | Status: DC

## 2022-06-22 MED ORDER — HYDROMORPHONE HCL 1 MG/ML IJ SOLN
0.2500 mg | INTRAMUSCULAR | Status: DC | PRN
  Administered 2022-06-22 (×2): 0.5 mg via INTRAVENOUS

## 2022-06-22 MED ORDER — BUPIVACAINE LIPOSOME 1.3 % IJ SUSP
INTRAMUSCULAR | Status: DC | PRN
  Administered 2022-06-22: 20 mL

## 2022-06-22 MED ORDER — CEFAZOLIN SODIUM-DEXTROSE 2-4 GM/100ML-% IV SOLN
2.0000 g | INTRAVENOUS | Status: AC
  Administered 2022-06-22: 2 g via INTRAVENOUS
  Filled 2022-06-22: qty 100

## 2022-06-22 MED ORDER — FENTANYL CITRATE (PF) 100 MCG/2ML IJ SOLN
INTRAMUSCULAR | Status: DC | PRN
  Administered 2022-06-22 (×2): 50 ug via INTRAVENOUS

## 2022-06-22 MED ORDER — PROPOFOL 1000 MG/100ML IV EMUL
INTRAVENOUS | Status: AC
  Filled 2022-06-22: qty 100

## 2022-06-22 MED ORDER — ISOPROPYL ALCOHOL 70 % SOLN
Status: DC | PRN
  Administered 2022-06-22: 1 via TOPICAL

## 2022-06-22 MED ORDER — BUPIVACAINE IN DEXTROSE 0.75-8.25 % IT SOLN
INTRATHECAL | Status: DC | PRN
  Administered 2022-06-22: 1.8 mL via INTRATHECAL

## 2022-06-22 MED ORDER — CELECOXIB 100 MG PO CAPS: 100.0000 mg | ORAL_CAPSULE | Freq: Two times a day (BID) | ORAL | 0 refills | Status: AC

## 2022-06-22 MED ORDER — SODIUM CHLORIDE 0.9 % IV SOLN: INTRAVENOUS | Status: DC

## 2022-06-22 MED ORDER — ORAL CARE MOUTH RINSE: 15.0000 mL | Freq: Once | OROMUCOSAL | Status: AC

## 2022-06-22 MED ORDER — MIDAZOLAM HCL 2 MG/2ML IJ SOLN
INTRAMUSCULAR | Status: AC
  Filled 2022-06-22: qty 2

## 2022-06-22 MED ORDER — METHOCARBAMOL 500 MG IVPB - SIMPLE MED
INTRAVENOUS | Status: AC
  Filled 2022-06-22: qty 55

## 2022-06-22 MED ORDER — BUPIVACAINE LIPOSOME 1.3 % IJ SUSP: 10.0000 mL | Freq: Once | INTRAMUSCULAR | Status: DC

## 2022-06-22 MED ORDER — LACTATED RINGERS IV BOLUS: 250.0000 mL | Freq: Once | INTRAVENOUS | Status: DC

## 2022-06-22 MED ORDER — ACETAMINOPHEN 10 MG/ML IV SOLN
INTRAVENOUS | Status: DC | PRN
  Administered 2022-06-22: 1000 mg via INTRAVENOUS

## 2022-06-22 MED ORDER — TRANEXAMIC ACID-NACL 1000-0.7 MG/100ML-% IV SOLN
1000.0000 mg | INTRAVENOUS | Status: AC
  Administered 2022-06-22: 1000 mg via INTRAVENOUS
  Filled 2022-06-22: qty 100

## 2022-06-22 MED ORDER — FENTANYL CITRATE (PF) 100 MCG/2ML IJ SOLN
INTRAMUSCULAR | Status: AC
  Filled 2022-06-22: qty 2

## 2022-06-22 MED ORDER — ONDANSETRON HCL 4 MG PO TABS: 4.0000 mg | ORAL_TABLET | Freq: Three times a day (TID) | ORAL | 0 refills | Status: AC | PRN

## 2022-06-22 MED ORDER — LACTATED RINGERS IV BOLUS
250.0000 mL | Freq: Once | INTRAVENOUS | Status: AC
  Administered 2022-06-22: 250 mL via INTRAVENOUS

## 2022-06-22 MED ORDER — ASPIRIN 81 MG PO TBEC: 81.0000 mg | DELAYED_RELEASE_TABLET | Freq: Two times a day (BID) | ORAL | 0 refills | Status: AC

## 2022-06-22 MED ORDER — DEXAMETHASONE SODIUM PHOSPHATE 10 MG/ML IJ SOLN
8.0000 mg | Freq: Once | INTRAMUSCULAR | Status: AC
  Administered 2022-06-22: 8 mg via INTRAVENOUS

## 2022-06-22 MED ORDER — POVIDONE-IODINE 10 % EX SWAB
2.0000 | Freq: Once | CUTANEOUS | Status: AC
  Administered 2022-06-22: 2 via TOPICAL

## 2022-06-22 MED ORDER — OXYCODONE HCL 5 MG PO TABS
5.0000 mg | ORAL_TABLET | ORAL | Status: DC | PRN
  Administered 2022-06-22: 5 mg via ORAL

## 2022-06-22 MED ORDER — PROPOFOL 500 MG/50ML IV EMUL
INTRAVENOUS | Status: DC | PRN
  Administered 2022-06-22: 100 ug/kg/min via INTRAVENOUS
  Administered 2022-06-22: 50 mg via INTRAVENOUS
  Administered 2022-06-22 (×2): 20 mg via INTRAVENOUS

## 2022-06-22 MED ORDER — CEFAZOLIN SODIUM-DEXTROSE 2-4 GM/100ML-% IV SOLN: 2.0000 g | Freq: Four times a day (QID) | INTRAVENOUS | Status: DC

## 2022-06-22 MED ORDER — CHLORHEXIDINE GLUCONATE 0.12 % MT SOLN
15.0000 mL | Freq: Once | OROMUCOSAL | Status: AC
  Administered 2022-06-22: 15 mL via OROMUCOSAL

## 2022-06-22 MED ORDER — OXYCODONE HCL 5 MG PO TABS: 5.0000 mg | ORAL_TABLET | ORAL | 0 refills | Status: AC | PRN

## 2022-06-22 MED ORDER — SODIUM CHLORIDE (PF) 0.9 % IJ SOLN
INTRAMUSCULAR | Status: DC | PRN
  Administered 2022-06-22: 60 mL

## 2022-06-22 MED ORDER — SODIUM CHLORIDE (PF) 0.9 % IJ SOLN
INTRAMUSCULAR | Status: AC
  Filled 2022-06-22: qty 10

## 2022-06-22 MED ORDER — ACETAMINOPHEN 10 MG/ML IV SOLN
INTRAVENOUS | Status: AC
  Filled 2022-06-22: qty 100

## 2022-06-22 MED ORDER — ONDANSETRON HCL 4 MG/2ML IJ SOLN
INTRAMUSCULAR | Status: DC | PRN
  Administered 2022-06-22: 4 mg via INTRAVENOUS

## 2022-06-22 MED ORDER — MIDAZOLAM HCL 5 MG/5ML IJ SOLN
INTRAMUSCULAR | Status: DC | PRN
  Administered 2022-06-22: 2 mg via INTRAVENOUS

## 2022-06-22 MED ORDER — ACETAMINOPHEN 500 MG PO TABS: 1000.0000 mg | ORAL_TABLET | Freq: Three times a day (TID) | ORAL | 0 refills | Status: AC | PRN

## 2022-06-22 MED ORDER — HYDROMORPHONE HCL 1 MG/ML IJ SOLN
INTRAMUSCULAR | Status: AC
  Filled 2022-06-22: qty 1

## 2022-06-22 MED ORDER — PHENYLEPHRINE HCL (PRESSORS) 10 MG/ML IV SOLN
INTRAVENOUS | Status: DC | PRN
  Administered 2022-06-22: 160 ug via INTRAVENOUS

## 2022-06-22 SURGICAL SUPPLY — 67 items
ADH SKN CLS APL DERMABOND .7 (GAUZE/BANDAGES/DRESSINGS) ×1
APL PRP STRL LF DISP 70% ISPRP (MISCELLANEOUS) ×2
BAG COUNTER SPONGE SURGICOUNT (BAG) IMPLANT
BAG DECANTER FOR FLEXI CONT (MISCELLANEOUS) ×2 IMPLANT
BAG SPEC THK2 15X12 ZIP CLS (MISCELLANEOUS) ×1
BAG SPNG CNTER NS LX DISP (BAG) ×2
BAG ZIPLOCK 12X15 (MISCELLANEOUS) ×2 IMPLANT
BLADE SAW SAG 25X90X1.19 (BLADE) ×2 IMPLANT
CHLORAPREP W/TINT 26 (MISCELLANEOUS) ×4 IMPLANT
COVER SURGICAL LIGHT HANDLE (MISCELLANEOUS) ×2 IMPLANT
DERMABOND ADVANCED .7 DNX12 (GAUZE/BANDAGES/DRESSINGS) ×2 IMPLANT
DRAPE HIP W/POCKET STRL (MISCELLANEOUS) ×2 IMPLANT
DRAPE INCISE IOBAN 66X45 STRL (DRAPES) ×2 IMPLANT
DRAPE INCISE IOBAN 85X60 (DRAPES) ×2 IMPLANT
DRAPE POUCH INSTRU U-SHP 10X18 (DRAPES) ×2 IMPLANT
DRAPE SHEET LG 3/4 BI-LAMINATE (DRAPES) ×6 IMPLANT
DRAPE SURG 17X11 SM STRL (DRAPES) ×2 IMPLANT
DRAPE U-SHAPE 47X51 STRL (DRAPES) ×4 IMPLANT
DRESSING AQUACEL AG SP 3.5X10 (GAUZE/BANDAGES/DRESSINGS) ×2 IMPLANT
DRSG AQUACEL AG 3.5X4 (GAUZE/BANDAGES/DRESSINGS) IMPLANT
DRSG AQUACEL AG SP 3.5X10 (GAUZE/BANDAGES/DRESSINGS) ×1
ELECT BLADE TIP CTD 4 INCH (ELECTRODE) ×2 IMPLANT
ELECT REM PT RETURN 15FT ADLT (MISCELLANEOUS) ×2 IMPLANT
GLOVE BIOGEL PI IND STRL 8 (GLOVE) ×2 IMPLANT
GLOVE SURG ORTHO 8.0 STRL STRW (GLOVE) ×4 IMPLANT
GOWN STRL REUS W/ TWL XL LVL3 (GOWN DISPOSABLE) ×2 IMPLANT
GOWN STRL REUS W/TWL XL LVL3 (GOWN DISPOSABLE) ×1
HANDPIECE INTERPULSE COAX TIP (DISPOSABLE) ×1
HEAD CERAMIC FEMORAL 36MM (Head) IMPLANT
HOLDER FOLEY CATH W/STRAP (MISCELLANEOUS) ×2 IMPLANT
HOOD PEEL AWAY FLYTE STAYCOOL (MISCELLANEOUS) ×6 IMPLANT
INSERT 0 DEG POLY  36 F (Miscellaneous) ×1 IMPLANT
INSERT 0 DEG POLY 36 F (Miscellaneous) IMPLANT
KIT BASIN OR (CUSTOM PROCEDURE TRAY) ×2 IMPLANT
KIT TURNOVER KIT A (KITS) IMPLANT
MANIFOLD NEPTUNE II (INSTRUMENTS) ×2 IMPLANT
MARKER SKIN DUAL TIP RULER LAB (MISCELLANEOUS) ×2 IMPLANT
NDL SAFETY ECLIP 18X1.5 (MISCELLANEOUS) IMPLANT
NEEDLE HYPO 22GX1.5 SAFETY (NEEDLE) IMPLANT
NS IRRIG 1000ML POUR BTL (IV SOLUTION) ×2 IMPLANT
PACK TOTAL JOINT (CUSTOM PROCEDURE TRAY) ×2 IMPLANT
PRESSURIZER FEMORAL UNIV (MISCELLANEOUS) IMPLANT
PROTECTOR NERVE ULNAR (MISCELLANEOUS) ×2 IMPLANT
RETRIEVER SUT HEWSON (MISCELLANEOUS) ×2 IMPLANT
SCREW HEX LP 6.5X20 (Screw) IMPLANT
SCREW HEX LP 6.5X35 (Screw) IMPLANT
SEALER BIPOLAR AQUA 6.0 (INSTRUMENTS) ×2 IMPLANT
SET HNDPC FAN SPRY TIP SCT (DISPOSABLE) IMPLANT
SHELL TRIDENT II CLUST SZ 56MM (Shell) IMPLANT
SPIKE FLUID TRANSFER (MISCELLANEOUS) ×6 IMPLANT
STEM 37MM HIP (Hips) IMPLANT
SUCTION FRAZIER HANDLE 12FR (TUBING) ×1
SUCTION TUBE FRAZIER 12FR DISP (TUBING) ×2 IMPLANT
SUT BONE WAX W31G (SUTURE) ×2 IMPLANT
SUT ETHIBOND #5 BRAIDED 30INL (SUTURE) ×2 IMPLANT
SUT MNCRL AB 3-0 PS2 18 (SUTURE) ×2 IMPLANT
SUT STRATAFIX 0 PDS 27 VIOLET (SUTURE) ×1
SUT STRATAFIX PDO 1 14 VIOLET (SUTURE) ×1
SUT STRATFX PDO 1 14 VIOLET (SUTURE) ×1
SUT VIC AB 2-0 CT2 27 (SUTURE) ×4 IMPLANT
SUTURE STRATFX 0 PDS 27 VIOLET (SUTURE) ×2 IMPLANT
SUTURE STRATFX PDO 1 14 VIOLET (SUTURE) ×2 IMPLANT
SYR 20ML LL LF (SYRINGE) ×4 IMPLANT
TOWEL OR 17X26 10 PK STRL BLUE (TOWEL DISPOSABLE) ×2 IMPLANT
TRAY FOLEY MTR SLVR 16FR STAT (SET/KITS/TRAYS/PACK) ×2 IMPLANT
UNDERPAD 30X36 HEAVY ABSORB (UNDERPADS AND DIAPERS) ×2 IMPLANT
WATER STERILE IRR 1000ML POUR (IV SOLUTION) ×4 IMPLANT

## 2022-06-22 NOTE — Interval H&P Note (Signed)
The patient has been re-examined, and the chart reviewed, and there have been no interval changes to the documented history and physical.    Plan for L THA for left hip AVN with femoral head collapse  The operative side was examined and the patient was confirmed to have sensation to DPN, SPN, TN intact, Motor EHL, ext, flex 5/5, and DP 2+, PT 2+, No significant edema.   The risks, benefits, and alternatives have been discussed at length with patient, and the patient is willing to proceed.  Left hip marked. Consent has been signed.

## 2022-06-22 NOTE — Anesthesia Postprocedure Evaluation (Signed)
Anesthesia Post Note  Patient: Dean Ferguson  Procedure(s) Performed: TOTAL HIP ARTHROPLASTY (Left: Hip)     Patient location during evaluation: PACU Anesthesia Type: Spinal Level of consciousness: awake Pain management: pain level controlled Vital Signs Assessment: post-procedure vital signs reviewed and stable Respiratory status: spontaneous breathing Cardiovascular status: stable Postop Assessment: no apparent nausea or vomiting Anesthetic complications: no   No notable events documented.  Last Vitals:  Vitals:   06/22/22 1220 06/22/22 1345  BP: (!) 132/98 (!) 133/97  Pulse: 76 78  Resp: 13 12  Temp:    SpO2: 94% 96%    Last Pain:  Vitals:   06/22/22 1345  TempSrc:   PainSc: 0-No pain                 Wilena Tyndall

## 2022-06-22 NOTE — Transfer of Care (Signed)
Immediate Anesthesia Transfer of Care Note  Patient: Dean Ferguson  Procedure(s) Performed: TOTAL HIP ARTHROPLASTY (Left: Hip)  Patient Location: PACU  Anesthesia Type:Spinal  Level of Consciousness: awake  Airway & Oxygen Therapy: Patient Spontanous Breathing  Post-op Assessment: Report given to RN  Post vital signs: stable  Last Vitals:  Vitals Value Taken Time  BP 140/75 06/22/22 1007  Temp    Pulse 59 06/22/22 1010  Resp 18 06/22/22 1010  SpO2 94 % 06/22/22 1010  Vitals shown include unvalidated device data.  Last Pain:  Vitals:   06/22/22 0545  TempSrc:   PainSc: 2       Patients Stated Pain Goal: 1 (96/78/93 8101)  Complications: No notable events documented.

## 2022-06-22 NOTE — Op Note (Signed)
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 male who has a diagnosis of left hip avascular necrosis and elected for surgical management after failing conservative treatment.  The risks benefits and alternatives were discussed with the patient including but not limited to the risks of nonoperative treatment, versus surgical intervention including infection, bleeding, nerve injury, periprosthetic fracture, the need for revision surgery, dislocation, leg length discrepancy, blood clots, cardiopulmonary complications, morbidity, mortality, among others, and they were willing to proceed.     OPERATIVE REPORT     SURGEON:  Charlies Constable, MD    ASSISTANT: Izola Price, RNFA, (Present throughout the entire procedure,  necessary for completion of procedure in a timely manner, assisting with retraction, instrumentation, and closure)     ANESTHESIA: Spinal  ESTIMATED BLOOD LOSS: 678 cc    COMPLICATIONS:  None.     COMPONENTS: [employer] Trident 256 mm acetabular shell, neutral polyethylene liner, size 7 Accolade two 127 degree neck angle, 36+0 ceramic femoral head Implant Name Type Inv. Item Serial No. Manufacturer Lot No. LRB No. Used Action  SHELL TRIDENT II CLUST SZ 56MM - [id number] Shell SHELL TRIDENT II CLUST SZ 56MM  [employer] [id number] Left 1 Implanted  SCREW HEX LP 6.5X35 - [id number] Screw SCREW HEX LP 6.5X35 [id number] [employer] [id number] Left 1 Implanted  SCREW HEX LP 6.5X20 - [id number] Screw SCREW HEX LP 6.5X20 [id number] [employer] [id number] Left 1 Implanted  INSERT 0 DEG POLY  36 F - [id number] Miscellaneous INSERT 0 DEG POLY  36 F [id number] [employer] [id number] Left 1 Implanted  STEM 37MM HIP - [id number] Hips STEM  37MM HIP  [employer] [id number] Left 1 Implanted  HEAD CERAMIC FEMORAL 36MM - [id number] Head HEAD CERAMIC FEMORAL 36MM  [employer] [id number] Left 1 Implanted      PROCEDURE IN DETAIL:   The patient was met in the holding area and  identified.  The appropriate hip was identified and marked at the operative site.  The patient was then transported to the OR  and  placed under anesthesia.  At that point, the patient was  placed in the lateral decubitus position with the operative side up and  secured to the operating room table  and all bony prominences padded. A subaxillary role was also placed.    The operative lower extremity was prepped from the iliac crest to the distal leg.  Sterile draping was performed.  Preoperative antibiotics, 2 gm of ancef,1 gm of Tranexamic Acid, and 8 mg of Decadron administered. Time out was performed prior to incision.      A routine posterolateral approach was utilized via sharp dissection  carried down to the subcutaneous tissue.  Gross bleeders were Bovie coagulated.  The iliotibial band was identified and incised along the length of the skin incision through the glute max fascia.  Charnley retractor was placed with care to protect the sciatic nerve posteriorly.  With the hip internally rotated, the piriformis tendon was identified and released from the femoral insertion and tagged with a #5 Ethibond.  A capsulotomy was then performed off the femoral insertion and also tagged with a #5 Ethibond.    The femoral neck was exposed, and I resected the femoral neck based on preoperative templating relative to the lesser  trochanter.    I then exposed the deep acetabulum, cleared out any tissue including the ligamentum teres.  After adequate visualization, I excised the labrum.  I then started reaming with a 50 mm reamer, first medializing to the floor of the cotyloid fossa, and then in the position of the cup aiming towards the greater sciatic notch, matching  the version of the transverse acetabular ligament and tucked under the anterior wall. I reamed up to 56 mm reamer with good bony bed preparation and a 56 mm cup was chosen.  The real cup was then impacted into place.  Appropriate version and inclination was confirmed clinically matching their bony anatomy, and also with the use of the jig.  I placed 2 screws in the posterior superior quadrant to augment fixation.  A neutral liner was placed and impacted. It was confirmed to be appropriately seated and the acetabular retractors were removed.    I then prepared the proximal femur using the box cutter, Charnley awl, and then sequentially broached starting with 0 up to a size 6.  A trial broach, neck, and head was utilized, and I reduced the hip and it was found to have excellent stability.  There was no impingement with full extension and 90 degrees external rotation.  The hip was stable at the position of sleep and with 90 degrees flexion and 80 degrees of internal rotation.  Leg lengths were also clinically assessed in the lateral position and felt to be equal. Intra-Op flatplate was obtained and confirmed appropriate component positions.  Clinically felt like leg lengths could be slightly longer, and though had good fill of the canal felt we could still go up 1 size.  No evidence or concern for fracture.  Elected to work up to a size 7 broach.  This sat about 2 to 3 mm more proud which was desirable.  Had excellent stability rotationally and axially.  A final femoral prosthesis size 7 was selected. I then impacted the real femoral prosthesis into place.I again trialed and selected a 36+ 49m ball. The hip was then reduced and taken through a range of motion. There was no impingement with full extension and 90 degrees external rotation.  The hip was stable at the position of sleep and with 90 degrees flexion and 80 degrees of internal rotation. Leg lengths were  again assessed and felt to be restored.  We  then opened, and I impacted the real head ball into place.  The posterior capsule was then closed with #5 Ethibond.  The piriformis was repaired through the base of the abductor tendon using a [place] suture passer.  I then irrigated the hip copiously with dilute Betadine and with normal saline pulse lavage. Periarticular injection was then performed with Exparel.   We repaired the fascia #1 barbed suture, followed by 0 barbed suture for the subcutaneous fat.  Skin was closed with 2-0 Vicryl and 3-0 Monocryl.  Dermabond and Aquacel dressing were applied. The patient was then awakened and returned to PACU in stable and satisfactory condition.  Leg lengths in the supine position were assessed and felt to be clinically equal. There were no complications.  Post op recs: WB: WBAT LLE, No formal hip precautions Abx: ancef Imaging: PACU pelvis Xray Dressing: Aquacell, keep intact until follow up DVT prophylaxis: Aspirin 81BID starting POD1 Follow up: 2 weeks after surgery for a wound check with Dr. [facility]Zachery [facility]at [facility]  [address] [place] [address]  Office Phone: [phone]   Charlies Constable, MD Orthopedic Surgeon

## 2022-06-22 NOTE — Evaluation (Signed)
Physical Therapy Evaluation Patient Details Name: Dean Ferguson MRN: [id number] DOB: 1986-03-07 Today's Date: 06/22/2022  History of Present Illness  Pt is a 36yo male presenting s/p L-THA, posterior approach without formal precautions. PMH: anxiety, GERD, OSA on CPAP, Anklyosing spondlyitis, GAD, ETOH abuse, chronic back pain, HTN, gout.  Clinical Impression  Dean Ferguson is a 36 y.o. male POD 0 s/p L-THA, posterior approach without formal precuations. Patient reports modified independence using rollator with mobility at baseline. Patient is now limited by functional impairments (see PT problem list below) and requires min guard for transfers and gait with RW. Patient was able to ambulate 100 feet with RW and min guard and cues for safe walker management. Patient educated on safe sequencing for stair mobility and verbalized safe guarding position for people assisting with mobility. Patient instructed in exercises to facilitate ROM and circulation. Patient will benefit from continued skilled PT interventions to address impairments and progress towards PLOF. Patient has met mobility goals at adequate level for discharge home; will continue to follow if pt continues acute stay to progress towards Mod I goals.       Recommendations for follow up therapy are one component of a multi-disciplinary discharge planning process, led by the attending physician.  Recommendations may be updated based on patient status, additional functional criteria and insurance authorization.  Follow Up Recommendations Follow physician's recommendations for discharge plan and follow up therapies      Assistance Recommended at Discharge Frequent or constant Supervision/Assistance  Patient can return home with the following  A little help with walking and/or transfers;A little help with bathing/dressing/bathroom;Assistance with cooking/housework;Assist for transportation;Help with stairs or ramp for entrance    Equipment  Recommendations Rolling walker (2 wheels)  Recommendations for Other Services       Functional Status Assessment Patient has had a recent decline in their functional status and demonstrates the ability to make significant improvements in function in a reasonable and predictable amount of time.     Precautions / Restrictions Restrictions Weight Bearing Restrictions: No Other Position/Activity Restrictions: wbat      Mobility  Bed Mobility Overal bed mobility: Modified Independent             General bed mobility comments: increased time    Transfers Overall transfer level: Needs assistance Equipment used: Rolling walker (2 wheels) Transfers: Sit to/from Stand Sit to Stand: Min guard, From elevated surface           General transfer comment: For safety only, VCs for hand placement and powering up off stretcher    Ambulation/Gait Ambulation/Gait assistance: Min guard Gait Distance (Feet): 100 Feet Assistive device: Rolling walker (2 wheels) Gait Pattern/deviations: Step-to pattern Gait velocity: decreased     General Gait Details: Pt ambulated with RW and min guard, no physical assist required or overt LOB noted.  Stairs Stairs: Yes Stairs assistance: Min assist Stair Management: No rails, Step to pattern, Backwards, With walker Number of Stairs: 2 General stair comments: Pt educated on stair mobility with RW and min assist, no overt LOB noted, wife provided min assist PT provided min guard.  Wheelchair Mobility    Modified Rankin (Stroke Patients Only)       Balance Overall balance assessment: Needs assistance Sitting-balance support: Feet supported, No upper extremity supported Sitting balance-Leahy Scale: Good     Standing balance support: During functional activity, Reliant on assistive device for balance, Bilateral upper extremity supported Standing balance-Leahy Scale: Poor  Pertinent Vitals/Pain  Pain Assessment Pain Assessment: 0-10 Pain Score: 7  Pain Location: left hip Pain Descriptors / Indicators: Operative site guarding, Discomfort, Burning Pain Intervention(s): Limited activity within patient's tolerance, Monitored during session, Repositioned, Ice applied    Home Living Family/patient expects to be discharged to:: Private residence Living Arrangements: Spouse/significant other Available Help at Discharge: Family;Available 24 hours/day Type of Home: House Home Access: Stairs to enter Entrance Stairs-Rails: None Entrance Stairs-Number of Steps: 3   Home Layout: Two level;Able to live on main level with bedroom/bathroom Home Equipment: Rollator (4 wheels);Cane - single point      Prior Function Prior Level of Function : Independent/Modified Independent;Working/employed;Driving ([employer])             Mobility Comments: uses Rollator ADLs Comments: IND     Journalist, newspaper        Extremity/Trunk Assessment   Upper Extremity Assessment Upper Extremity Assessment: Overall WFL for tasks assessed    Lower Extremity Assessment Lower Extremity Assessment: RLE deficits/detail;LLE deficits/detail RLE Deficits / Details: MMT ank DF/PF 5/5 RLE Sensation: WNL (Glutes numb) LLE Deficits / Details: MMT ank DF/PF 5/5 LLE Sensation: WNL (glutes still numb)    Cervical / Trunk Assessment Cervical / Trunk Assessment: Normal  Communication   Communication: No difficulties  Cognition Arousal/Alertness: Awake/alert Behavior During Therapy: WFL for tasks assessed/performed Overall Cognitive Status: Within Functional Limits for tasks assessed                                          General Comments General comments (skin integrity, edema, etc.): Wife Aldona Bar present    Exercises Total Joint Exercises Ankle Circles/Pumps: AROM, Both, 10 reps Quad Sets: AROM, Left, Other reps (comment) (2) Short Arc Quad: AROM, Right, Other reps (comment)  (2) Heel Slides: AROM, Right, Other reps (comment) (3) Hip ABduction/ADduction: AAROM, Right, Other reps (comment) (3)   Assessment/Plan    PT Assessment Patient needs continued PT services  PT Problem List Decreased strength;Decreased range of motion;Decreased activity tolerance;Decreased balance;Decreased mobility;Decreased coordination;Pain       PT Treatment Interventions DME instruction;Gait training;Stair training;Functional mobility training;Therapeutic activities;Therapeutic exercise;Balance training;Neuromuscular re-education;Patient/family education    PT Goals (Current goals can be found in the Care Plan section)  Acute Rehab PT Goals Patient Stated Goal: Go back to work PT Goal Formulation: With patient Time For Goal Achievement: 06/29/22 Potential to Achieve Goals: Good    Frequency 7X/week     Co-evaluation               AM-PAC PT "6 Clicks" Mobility  Outcome Measure Help needed turning from your back to your side while in a flat bed without using bedrails?: None Help needed moving from lying on your back to sitting on the side of a flat bed without using bedrails?: A Little Help needed moving to and from a bed to a chair (including a wheelchair)?: A Little Help needed standing up from a chair using your arms (e.g., wheelchair or bedside chair)?: A Little Help needed to walk in hospital room?: A Little Help needed climbing 3-5 steps with a railing? : A Little 6 Click Score: 19    End of Session Equipment Utilized During Treatment: Gait belt Activity Tolerance: Patient tolerated treatment well;No increased pain Patient left: in chair;with call bell/phone within reach;with family/visitor present Nurse Communication: Mobility status PT Visit Diagnosis: Pain;Difficulty in walking, not elsewhere  classified (R26.2) Pain - Right/Left: Left Pain - part of body: Hip    Time: 1217-1255 PT Time Calculation (min) (ACUTE ONLY): 38 min   Charges:   PT  Evaluation $PT Eval Low Complexity: 1 Low PT Treatments $Gait Training: 8-22 mins        Coolidge Breeze, PT, DPT [facility] Office: [phone] Weekend pager: [phone]  Coolidge Breeze 06/22/2022, 1:04 PM

## 2022-06-22 NOTE — Addendum Note (Signed)
Addendum  created 06/22/22 1614 by Belinda Block, MD   Clinical Note Signed

## 2022-06-22 NOTE — Discharge Instructions (Signed)

## 2022-06-22 NOTE — Anesthesia Procedure Notes (Signed)
Spinal  Patient location during procedure: OR Start time: 06/22/2022 7:45 AM End time: 06/22/2022 7:50 AM Reason for block: surgical anesthesia Staffing Performed: anesthesiologist  Anesthesiologist: Lynda Rainwater, MD Performed by: Lynda Rainwater, MD Authorized by: Belinda Block, MD   Preanesthetic Checklist Completed: patient identified, IV checked, site marked, risks and benefits discussed, surgical consent, monitors and equipment checked, pre-op evaluation and timeout performed Spinal Block Patient position: sitting Prep: DuraPrep Patient monitoring: heart rate, cardiac monitor, continuous pulse ox and blood pressure Approach: midline Location: L3-4 Injection technique: single-shot Needle Needle type: Sprotte  Needle gauge: 24 G Needle length: 9 cm Assessment Sensory level: T4 Events: CSF return

## 2022-06-24 ENCOUNTER — Ambulatory Visit: Payer: 59 | Attending: Orthopedic Surgery

## 2022-06-24 ENCOUNTER — Other Ambulatory Visit: Payer: Self-pay

## 2022-06-24 DIAGNOSIS — M25552 Pain in left hip: Secondary | ICD-10-CM

## 2022-06-24 DIAGNOSIS — R262 Difficulty in walking, not elsewhere classified: Secondary | ICD-10-CM

## 2022-06-24 NOTE — Therapy (Signed)
OUTPATIENT PHYSICAL THERAPY LOWER EXTREMITY EVALUATION   Patient Name: Dean Ferguson MRN: [id number] DOB:[birth year], 36 y.o., male Today's Date: 06/24/2022   PT End of Session - 06/24/22 0813     Visit Number 1    Number of Visits 12    Date for PT Re-Evaluation 08/19/22    PT Start Time 0816    PT Stop Time 0858    PT Time Calculation (min) 42 min    Activity Tolerance Patient tolerated treatment well    Behavior During Therapy [facility] for tasks assessed/performed             Past Medical History:  Diagnosis Date   Anxiety    Arthritis    Fatty liver 07/15/2019   GERD (gastroesophageal reflux disease)    History of kidney stones    Sleep apnea    cpap   Past Surgical History:  Procedure Laterality Date   benign cyst removed from tongue      LEG SURGERY Right    [place]   right foot surgery      SHOULDER SURGERY Right    [place]   Patient Active Problem List   Diagnosis Date Noted   Ankylosing spondylitis ([place]) 02/24/2022   GAD (generalized anxiety disorder) 02/03/2020   Obesity (BMI 30-39.9) 02/03/2020   Alcohol abuse 02/03/2020   Elevated liver enzymes 02/03/2020   Skin tag 10/15/2019   Fatty liver 07/15/2019   HLA B27 (HLA B27 positive) 12/28/2018   Iritis of left eye 12/28/2018   Chronic back pain 12/28/2018   Severe obstructive sleep apnea-hypopnea syndrome 10/05/2018   Essential hypertension 10/01/2018   OSA (obstructive sleep apnea) 07/23/2018   Psychophysiological insomnia 07/23/2018   Depression, major, single episode, mild ([place]) 06/24/2018   Gout 05/28/2018   Snoring 05/24/2018    PCP: Sharion Balloon, FNP  REFERRING PROVIDER: Willaim Sheng, MD  REFERRING DIAG: Post OP Left Total Hip Replacement-DOS 06/22/22  THERAPY DIAG:  Pain in left hip  Difficulty in walking, not elsewhere classified  Rationale for Evaluation and Treatment Rehabilitation  ONSET DATE: 06/22/22  SUBJECTIVE:   SUBJECTIVE  STATEMENT: Patient reports that he had a left total hip replacement on 06/22/22. He notes that his main pain has been along the incision, but his hip has not hurt like it did prior to surgery.   PERTINENT HISTORY: Planning to have right hip replaced later this year, HTN, ankylosing spondylitis, depression, and obesity  PAIN:  Are you having pain? Yes: NPRS scale: 6/10 Pain location: left hip along incision Pain description: pulling, sore Aggravating factors: sitting, movement Relieving factors: ice and medication  PRECAUTIONS: None, per operative report   WEIGHT BEARING RESTRICTIONS No  FALLS:  Has patient fallen in last 6 months? No  LIVING ENVIRONMENT: Lives with: lives with their family Lives in: House/apartment Stairs: Yes: Internal: does not go upstairs steps; can reach both and External: 3  steps; none step to pattern Has following equipment at home: Gilford Rile - 2 wheeled  OCCUPATION: Full time at [employer], but working from home currently  PLOF: Independent  PATIENT GOALS walking without assistive device, normal daily activities, play with his kids in the yard  NEXT MD FOLLOW UP: 07/07/22   OBJECTIVE:   DIAGNOSTIC FINDINGS:   06/22/22 hip x-ray There is interval left hip arthroplasty. There are pockets of air in the soft tissues. There is deformity in the head of the right femur suggesting possible avascular necrosis with fracture.  PATIENT SURVEYS:  FOTO 29.77  COGNITION:  Overall cognitive status: Within functional limits for tasks assessed     SENSATION: Patient reports diminished sensation around incision  POSTURE: No Significant postural limitations  PALPATION: TTP: left lateral hip along incision  LOWER EXTREMITY ROM:  Active ROM Right eval Left eval  Hip flexion 70; painful 100; pulling along incision  Hip extension    Hip abduction 36; painful  12; limited by discomfort around incision  Hip adduction    Hip internal rotation    Hip  external rotation    Knee flexion    Knee extension    Ankle dorsiflexion    Ankle plantarflexion    Ankle inversion    Ankle eversion     (Blank rows = not tested)  FUNCTIONAL TESTS:  Requires upper extremity support for sit to stand transfers  GAIT: Assistive device utilized: Environmental consultant - 2 wheeled Level of assistance: Modified independence Comments: step to pattern with LLE leading  TODAY'S TREATMENT:                                   9/29 EXERCISE LOG  Exercise Repetitions and Resistance Comments  Nustep L1 x 2 minutes  Limited by right hip pain                    Blank cell = exercise not performed today    PATIENT EDUCATION:  Education details: prognosis, POC, healing  Person educated: Patient Education method: Explanation Education comprehension: verbalized understanding   HOME EXERCISE PROGRAM: Reviewed HEP provided by referring physician. He was able to recall these with minimal difficulty.   ASSESSMENT:  CLINICAL IMPRESSION: Patient is a 36 y.o. male who was seen today for physical therapy evaluation and treatment following a left total hip arthroplasty (posterolateral) on 06/22/22. He presented with minimal to moderate pain severity and irritability with hip AROM recreating his familiar pulling along his incision. He also presented with right hip pain which limited the AROM of his right hip. Recommend that he continue with skilled physical therapy to address his impairments to maximize his functional mobility.   OBJECTIVE IMPAIRMENTS Abnormal gait, decreased activity tolerance, decreased mobility, difficulty walking, decreased ROM, decreased strength, and pain.   ACTIVITY LIMITATIONS carrying, lifting, bending, sitting, standing, squatting, stairs, transfers, bed mobility, dressing, hygiene/grooming, locomotion level, and caring for others  PARTICIPATION LIMITATIONS: cleaning, shopping, community activity, occupation, and yard work  PERSONAL FACTORS 3+  comorbidities: HTN, ankylosing spondylitis, depression, and obesity  are also affecting patient's functional outcome.   REHAB POTENTIAL: Good  CLINICAL DECISION MAKING: Stable/uncomplicated  EVALUATION COMPLEXITY: Low   GOALS: Goals reviewed with patient? No  SHORT TERM GOALS: Target date: 07/15/2022  Patient will be independent with his initial HEP.  Baseline: Goal status: INITIAL  2.  Patient will be able to safely ambulate at least 80 feet without an assistive device.  Baseline:  Goal status: INITIAL  3.  Patient will be able to complete his daily activities without his familiar left pain exceeding 4/10.  Baseline:  Goal status: INITIAL  LONG TERM GOALS: Target date: 08/05/2022   Patient will be independent with his advanced HEP. Baseline:  Goal status: INITIAL  2.  Patient will be able to safely ambulate without significant gait deviations.  Baseline:  Goal status: INITIAL  3.  Patient will be able to complete his daily activities without his familiar left hip pain exceeding 2/10.  Baseline:  Goal status: INITIAL  4.  Patient will be able to navigate at least 3 steps without assistance for improved household mobility.  Baseline: currently requires help from his wife and the rolling walker Goal status: INITIAL  PLAN: PT FREQUENCY: 2x/week  PT DURATION: 6 weeks  PLANNED INTERVENTIONS: Therapeutic exercises, Therapeutic activity, Neuromuscular re-education, Balance training, Gait training, Patient/Family education, Self Care, Joint mobilization, Stair training, Electrical stimulation, Cryotherapy, Moist heat, Manual therapy, and Re-evaluation  PLAN FOR NEXT SESSION: nustep (as able, limited by right hip pain), lower extremity strengthening such as SLR, and modalities as needed    Darlin Coco, PT 06/24/2022, 2:19 PM

## 2022-06-28 ENCOUNTER — Ambulatory Visit: Payer: 59 | Attending: Orthopedic Surgery

## 2022-06-28 DIAGNOSIS — R262 Difficulty in walking, not elsewhere classified: Secondary | ICD-10-CM | POA: Insufficient documentation

## 2022-06-28 DIAGNOSIS — M25552 Pain in left hip: Secondary | ICD-10-CM | POA: Insufficient documentation

## 2022-06-28 NOTE — Therapy (Signed)
OUTPATIENT PHYSICAL THERAPY LOWER EXTREMITY EVALUATION   Patient Name: Dean Ferguson MRN: [id number] DOB:1986/08/31, 36 y.o., male Today's Date: 06/28/2022   PT End of Session - 06/28/22 1302     Visit Number 2    Number of Visits 12    Date for PT Re-Evaluation 08/19/22    PT Start Time 1300    PT Stop Time 1350    PT Time Calculation (min) 50 min    Activity Tolerance Patient tolerated treatment well    Behavior During Therapy [facility] for tasks assessed/performed              Past Medical History:  Diagnosis Date   Anxiety    Arthritis    Fatty liver 07/15/2019   GERD (gastroesophageal reflux disease)    History of kidney stones    Sleep apnea    cpap   Past Surgical History:  Procedure Laterality Date   benign cyst removed from tongue      LEG SURGERY Right    [place]   right foot surgery      SHOULDER SURGERY Right    [place] Left 06/22/2022   Procedure: TOTAL HIP ARTHROPLASTY;  Surgeon: Willaim Sheng, MD;  Location: [facility];  Service: Orthopedics;  Laterality: Left;   Patient Active Problem List   Diagnosis Date Noted   Ankylosing spondylitis ([place]) 02/24/2022   GAD (generalized anxiety disorder) 02/03/2020   Obesity (BMI 30-39.9) 02/03/2020   Alcohol abuse 02/03/2020   Elevated liver enzymes 02/03/2020   Skin tag 10/15/2019   Fatty liver 07/15/2019   HLA B27 (HLA B27 positive) 12/28/2018   Iritis of left eye 12/28/2018   Chronic back pain 12/28/2018   Severe obstructive sleep apnea-hypopnea syndrome 10/05/2018   Essential hypertension 10/01/2018   OSA (obstructive sleep apnea) 07/23/2018   Psychophysiological insomnia 07/23/2018   Depression, major, single episode, mild ([place]) 06/24/2018   Gout 05/28/2018   Snoring 05/24/2018    PCP: Sharion Balloon, FNP  REFERRING PROVIDER: Willaim Sheng, MD  REFERRING DIAG: Post OP Left Total Hip Replacement-DOS 06/22/22  THERAPY DIAG:  Pain in left  hip  Difficulty in walking, not elsewhere classified  Rationale for Evaluation and Treatment Rehabilitation  ONSET DATE: 06/22/22  SUBJECTIVE:   SUBJECTIVE STATEMENT: Patient reports that his left hip is not hurting. He notes that he had an allergic reaction to the Celebrex Friday and was told to stop taking it.   PERTINENT HISTORY: Planning to have right hip replaced later this year, HTN, ankylosing spondylitis, depression, and obesity  PAIN:  Are you having pain? Yes: NPRS scale: 0/10 Pain location: left hip along incision Pain description: pulling, sore Aggravating factors: sitting, movement Relieving factors: ice and medication  PRECAUTIONS: None, per operative report   WEIGHT BEARING RESTRICTIONS No  FALLS:  Has patient fallen in last 6 months? No  LIVING ENVIRONMENT: Lives with: lives with their family Lives in: House/apartment Stairs: Yes: Internal: does not go upstairs steps; can reach both and External: 3  steps; none step to pattern Has following equipment at home: Gilford Rile - 2 wheeled  OCCUPATION: Full time at [employer], but working from home currently  PLOF: Independent  PATIENT GOALS walking without assistive device, normal daily activities, play with his kids in the yard  NEXT MD FOLLOW UP: 07/07/22   OBJECTIVE: all objective measures were performed at his initial evaluation on 06/24/22 unless otherwise noted  DIAGNOSTIC FINDINGS:   06/22/22  hip x-ray There is interval left hip arthroplasty. There are pockets of air in the soft tissues. There is deformity in the head of the right femur suggesting possible avascular necrosis with fracture.  PATIENT SURVEYS:  FOTO 29.77  COGNITION:  Overall cognitive status: Within functional limits for tasks assessed     SENSATION: Patient reports diminished sensation around incision  POSTURE: No Significant postural limitations  PALPATION: TTP: left lateral hip along incision  LOWER EXTREMITY ROM:  Active  ROM Right eval Left eval  Hip flexion 70; painful 100; pulling along incision  Hip extension    Hip abduction 36; painful  12; limited by discomfort around incision  Hip adduction    Hip internal rotation    Hip external rotation    Knee flexion    Knee extension    Ankle dorsiflexion    Ankle plantarflexion    Ankle inversion    Ankle eversion     (Blank rows = not tested)  FUNCTIONAL TESTS:  Requires upper extremity support for sit to stand transfers  GAIT: Assistive device utilized: Environmental consultant - 2 wheeled Level of assistance: Modified independence Comments: step to pattern with LLE leading  TODAY'S TREATMENT:                                   10/3 EXERCISE LOG  Exercise Repetitions and Resistance Comments  Rocker board  4 minutes   SLR  20 reps   Supine hip ADD isometric 30 reps w/ 5 second hold   Supine clams Red t-band x 30 reps   Standing hip ABD  20 reps each    Nustep L4-5 x 10 minutes LLE and UE only  Step up 6" step; 2 minutes Alternating LE  Tandem balance  2 x 1 minute each   Squatting  20 reps   Marching on foam  3 minutes Intermittent UE support   Blank cell = exercise not performed today                                    9/29 EXERCISE LOG  Exercise Repetitions and Resistance Comments  Nustep L1 x 2 minutes  Limited by right hip pain                    Blank cell = exercise not performed today    PATIENT EDUCATION:  Education details: prognosis, POC, healing  Person educated: Patient Education method: Explanation Education comprehension: verbalized understanding   HOME EXERCISE PROGRAM: Reviewed HEP provided by referring physician. He was able to recall these with minimal difficulty.   ASSESSMENT:  CLINICAL IMPRESSION: Patient was progressed with multiple new interventions for improved lower extremity strength and stability with moderate difficulty. He required minimal cueing with today's new interventions for proper exercise performance to  facilitate proper biomechanics. He reported no left hip pain or discomfort with any of today's interventions. However, his right hip pain resulted in interventions such as the nustep and hip abduction being modified to avoid aggravating his right hip pain. He reported that he "could tell he had done a lot today" upon the conclusion of treatment. He continues to require skilled physical therapy to address his remaining impairments to return to his prior level of function.   OBJECTIVE IMPAIRMENTS Abnormal gait, decreased activity tolerance, decreased mobility, difficulty walking, decreased ROM, decreased  strength, and pain.   ACTIVITY LIMITATIONS carrying, lifting, bending, sitting, standing, squatting, stairs, transfers, bed mobility, dressing, hygiene/grooming, locomotion level, and caring for others  PARTICIPATION LIMITATIONS: cleaning, shopping, community activity, occupation, and yard work  PERSONAL FACTORS 3+ comorbidities: HTN, ankylosing spondylitis, depression, and obesity  are also affecting patient's functional outcome.   REHAB POTENTIAL: Good  CLINICAL DECISION MAKING: Stable/uncomplicated  EVALUATION COMPLEXITY: Low   GOALS: Goals reviewed with patient? No  SHORT TERM GOALS: Target date: 07/15/2022  Patient will be independent with his initial HEP.  Baseline: Goal status: INITIAL  2.  Patient will be able to safely ambulate at least 80 feet without an assistive device.  Baseline:  Goal status: INITIAL  3.  Patient will be able to complete his daily activities without his familiar left pain exceeding 4/10.  Baseline:  Goal status: INITIAL  LONG TERM GOALS: Target date: 08/05/2022   Patient will be independent with his advanced HEP. Baseline:  Goal status: INITIAL  2.  Patient will be able to safely ambulate without significant gait deviations.  Baseline:  Goal status: INITIAL  3.  Patient will be able to complete his daily activities without his familiar left hip  pain exceeding 2/10.  Baseline:  Goal status: INITIAL  4.  Patient will be able to navigate at least 3 steps without assistance for improved household mobility.  Baseline: currently requires help from his wife and the rolling walker Goal status: INITIAL  PLAN: PT FREQUENCY: 2x/week  PT DURATION: 6 weeks  PLANNED INTERVENTIONS: Therapeutic exercises, Therapeutic activity, Neuromuscular re-education, Balance training, Gait training, Patient/Family education, Self Care, Joint mobilization, Stair training, Electrical stimulation, Cryotherapy, Moist heat, Manual therapy, and Re-evaluation  PLAN FOR NEXT SESSION: nustep (as able, limited by right hip pain), lower extremity strengthening such as SLR, and modalities as needed    Darlin Coco, PT 06/28/2022, 2:00 PM

## 2022-06-30 ENCOUNTER — Encounter: Payer: Self-pay | Admitting: *Deleted

## 2022-06-30 ENCOUNTER — Ambulatory Visit: Payer: 59 | Admitting: *Deleted

## 2022-06-30 DIAGNOSIS — M25552 Pain in left hip: Secondary | ICD-10-CM

## 2022-06-30 DIAGNOSIS — R262 Difficulty in walking, not elsewhere classified: Secondary | ICD-10-CM

## 2022-06-30 NOTE — Therapy (Signed)
OUTPATIENT PHYSICAL THERAPY LOWER EXTREMITY EVALUATION   Patient Name: Dean Ferguson MRN: [id number] DOB:1986-09-16, 36 y.o., male Today's Date: 06/30/2022   PT End of Session - 06/30/22 1358     Visit Number 3    Number of Visits 12    Date for PT Re-Evaluation 08/19/22    PT Start Time 1345    PT Stop Time 4765    PT Time Calculation (min) 49 min              Past Medical History:  Diagnosis Date   Anxiety    Arthritis    Fatty liver 07/15/2019   GERD (gastroesophageal reflux disease)    History of kidney stones    Sleep apnea    cpap   Past Surgical History:  Procedure Laterality Date   benign cyst removed from tongue      LEG SURGERY Right    [place]   right foot surgery      SHOULDER SURGERY Right    [place] Left 06/22/2022   Procedure: TOTAL HIP ARTHROPLASTY;  Surgeon: Willaim Sheng, MD;  Location: [facility];  Service: Orthopedics;  Laterality: Left;   Patient Active Problem List   Diagnosis Date Noted   Ankylosing spondylitis ([place]) 02/24/2022   GAD (generalized anxiety disorder) 02/03/2020   Obesity (BMI 30-39.9) 02/03/2020   Alcohol abuse 02/03/2020   Elevated liver enzymes 02/03/2020   Skin tag 10/15/2019   Fatty liver 07/15/2019   HLA B27 (HLA B27 positive) 12/28/2018   Iritis of left eye 12/28/2018   Chronic back pain 12/28/2018   Severe obstructive sleep apnea-hypopnea syndrome 10/05/2018   Essential hypertension 10/01/2018   OSA (obstructive sleep apnea) 07/23/2018   Psychophysiological insomnia 07/23/2018   Depression, major, single episode, mild ([place]) 06/24/2018   Gout 05/28/2018   Snoring 05/24/2018    PCP: Sharion Balloon, FNP  REFERRING PROVIDER: Willaim Sheng, MD  REFERRING DIAG: Post OP Left Total Hip Replacement-DOS 06/22/22  THERAPY DIAG:  Pain in left hip  Difficulty in walking, not elsewhere classified  Rationale for Evaluation and Treatment  Rehabilitation  ONSET DATE: 06/22/22  SUBJECTIVE:   SUBJECTIVE STATEMENT: Patient reports that his left hip is not hurting just sore. He reports RT hip pain is worse     PERTINENT HISTORY: Planning to have right hip replaced later this year, HTN, ankylosing spondylitis, depression, and obesity  PAIN:  Are you having pain? Yes: NPRS scale: 3/10 Pain location: left hip along incision Pain description: pulling, sore Aggravating factors: sitting, movement Relieving factors: ice and medication  PRECAUTIONS: None, per operative report   WEIGHT BEARING RESTRICTIONS No  FALLS:  Has patient fallen in last 6 months? No  LIVING ENVIRONMENT: Lives with: lives with their family Lives in: House/apartment Stairs: Yes: Internal: does not go upstairs steps; can reach both and External: 3  steps; none step to pattern Has following equipment at home: Gilford Rile - 2 wheeled  OCCUPATION: Full time at [employer], but working from home currently  PLOF: Independent  PATIENT GOALS walking without assistive device, normal daily activities, play with his kids in the yard  NEXT MD FOLLOW UP: 07/07/22   OBJECTIVE: all objective measures were performed at his initial evaluation on 06/24/22 unless otherwise noted  DIAGNOSTIC FINDINGS:   06/22/22 hip x-ray There is interval left hip arthroplasty. There are pockets of air in the soft tissues. There is deformity in the head of the right femur  suggesting possible avascular necrosis with fracture. TODAY'S TREATMENT:                                   10/5    EXERCISE LOG  LT LE  Exercise Repetitions and Resistance Comments  Rocker board  5 minutes and balance   SLR     Supine hip ADD isometric    Supine clams    Standing hip ABD  3x10 reps each  Pain  RT  hip  Standing Hip flexion LT LE 3x10   Nustep L4-5 x 16 minutes LLE and UE only  Step up 6" step;  3 x10 Alternating LE  Tandem balance  4 x 1 minute each    Squatting     Marching on foam    Intermittent UE support  LAQs 3# 3x10 pause at top    Blank cell = exercise not performed today                                    9/29 EXERCISE LOG  Exercise Repetitions and Resistance Comments  Nustep L1 x 2 minutes  Limited by right hip pain                    Blank cell = exercise not performed today    PATIENT EDUCATION:  Education details: prognosis, POC, healing  Person educated: Patient Education method: Explanation Education comprehension: verbalized understanding   HOME EXERCISE PROGRAM: Reviewed HEP provided by referring physician. He was able to recall these with minimal difficulty.   ASSESSMENT:  CLINICAL IMPRESSION: Pt arrived today doing fairly well with LT hip, but reports RT hip continues to hurt. Pt was able to perform sitting and standing exercises and did well. Exercises are modified to decrease RT hip pain as needed.Pt currently using FWW for ambulation.    OBJECTIVE IMPAIRMENTS Abnormal gait, decreased activity tolerance, decreased mobility, difficulty walking, decreased ROM, decreased strength, and pain.   ACTIVITY LIMITATIONS carrying, lifting, bending, sitting, standing, squatting, stairs, transfers, bed mobility, dressing, hygiene/grooming, locomotion level, and caring for others  PARTICIPATION LIMITATIONS: cleaning, shopping, community activity, occupation, and yard work  PERSONAL FACTORS 3+ comorbidities: HTN, ankylosing spondylitis, depression, and obesity  are also affecting patient's functional outcome.   REHAB POTENTIAL: Good  CLINICAL DECISION MAKING: Stable/uncomplicated  EVALUATION COMPLEXITY: Low   GOALS: Goals reviewed with patient? No  SHORT TERM GOALS: Target date: 07/15/2022  Patient will be independent with his initial HEP.  Baseline: Goal status: INITIAL  2.  Patient will be able to safely ambulate at least 80 feet without an assistive device.  Baseline:  Goal status: INITIAL  3.  Patient will be able to complete his  daily activities without his familiar left pain exceeding 4/10.  Baseline:  Goal status: INITIAL  LONG TERM GOALS: Target date: 08/05/2022   Patient will be independent with his advanced HEP. Baseline:  Goal status: INITIAL  2.  Patient will be able to safely ambulate without significant gait deviations.  Baseline:  Goal status: INITIAL  3.  Patient will be able to complete his daily activities without his familiar left hip pain exceeding 2/10.  Baseline:  Goal status: INITIAL  4.  Patient will be able to navigate at least 3 steps without assistance for improved household mobility.  Baseline: currently requires help from his wife  and the rolling walker Goal status: INITIAL  PLAN: PT FREQUENCY: 2x/week  PT DURATION: 6 weeks  PLANNED INTERVENTIONS: Therapeutic exercises, Therapeutic activity, Neuromuscular re-education, Balance training, Gait training, Patient/Family education, Self Care, Joint mobilization, Stair training, Electrical stimulation, Cryotherapy, Moist heat, Manual therapy, and Re-evaluation  PLAN FOR NEXT SESSION: nustep (as able, limited by right hip pain), lower extremity strengthening such as SLR, and modalities as needed    Lexie Koehl,CHRIS, PTA 06/30/2022, 2:45 PM

## 2022-07-04 ENCOUNTER — Ambulatory Visit: Payer: 59

## 2022-07-04 DIAGNOSIS — M25552 Pain in left hip: Secondary | ICD-10-CM

## 2022-07-04 DIAGNOSIS — R262 Difficulty in walking, not elsewhere classified: Secondary | ICD-10-CM

## 2022-07-04 NOTE — Therapy (Addendum)
OUTPATIENT PHYSICAL THERAPY LOWER EXTREMITY TREATMENT   Patient Name: Montey Ebel MRN: [id number] DOB:[birth year], 36 y.o., male Today's Date: 07/04/2022   PT End of Session - 07/04/22 1306     Visit Number 4    Number of Visits 12    Date for PT Re-Evaluation 08/19/22    PT Start Time 1300    PT Stop Time 1345    PT Time Calculation (min) 45 min    Activity Tolerance Patient tolerated treatment well    Behavior During Therapy [facility] for tasks assessed/performed               Past Medical History:  Diagnosis Date   Anxiety    Arthritis    Fatty liver 07/15/2019   GERD (gastroesophageal reflux disease)    History of kidney stones    Sleep apnea    cpap   Past Surgical History:  Procedure Laterality Date   benign cyst removed from tongue      LEG SURGERY Right    [place]   right foot surgery      SHOULDER SURGERY Right    [place] Left 06/22/2022   Procedure: TOTAL HIP ARTHROPLASTY;  Surgeon: Willaim Sheng, MD;  Location: [facility];  Service: Orthopedics;  Laterality: Left;   Patient Active Problem List   Diagnosis Date Noted   Ankylosing spondylitis ([place]) 02/24/2022   GAD (generalized anxiety disorder) 02/03/2020   Obesity (BMI 30-39.9) 02/03/2020   Alcohol abuse 02/03/2020   Elevated liver enzymes 02/03/2020   Skin tag 10/15/2019   Fatty liver 07/15/2019   HLA B27 (HLA B27 positive) 12/28/2018   Iritis of left eye 12/28/2018   Chronic back pain 12/28/2018   Severe obstructive sleep apnea-hypopnea syndrome 10/05/2018   Essential hypertension 10/01/2018   OSA (obstructive sleep apnea) 07/23/2018   Psychophysiological insomnia 07/23/2018   Depression, major, single episode, mild ([place]) 06/24/2018   Gout 05/28/2018   Snoring 05/24/2018    PCP: Sharion Balloon, FNP  REFERRING PROVIDER: Willaim Sheng, MD  REFERRING DIAG: Post OP Left Total Hip Replacement-DOS 06/22/22  THERAPY DIAG:  Pain in  left hip  Difficulty in walking, not elsewhere classified  Rationale for Evaluation and Treatment Rehabilitation  ONSET DATE: 06/22/22  SUBJECTIVE:   SUBJECTIVE STATEMENT: Patient reports that his hip feels alright today.   PERTINENT HISTORY: Planning to have right hip replaced later this year, HTN, ankylosing spondylitis, depression, and obesity  PAIN:  Are you having pain? Yes: NPRS scale: 2/10 Pain location: left hip along incision Pain description: pulling, sore Aggravating factors: sitting, movement Relieving factors: ice and medication  PRECAUTIONS: None, per operative report   WEIGHT BEARING RESTRICTIONS No  FALLS:  Has patient fallen in last 6 months? No  LIVING ENVIRONMENT: Lives with: lives with their family Lives in: House/apartment Stairs: Yes: Internal: does not go upstairs steps; can reach both and External: 3  steps; none step to pattern Has following equipment at home: Gilford Rile - 2 wheeled  OCCUPATION: Full time at [employer], but working from home currently  PLOF: Independent  PATIENT GOALS walking without assistive device, normal daily activities, play with his kids in the yard  NEXT MD FOLLOW UP: 07/07/22   OBJECTIVE: all objective measures were performed at his initial evaluation on 06/24/22 unless otherwise noted  DIAGNOSTIC FINDINGS:   06/22/22 hip x-ray There is interval left hip arthroplasty. There are pockets of air in the soft tissues. There is  deformity in the head of the right femur suggesting possible avascular necrosis with fracture. TODAY'S TREATMENT:                                   10/9 EXERCISE LOG  Exercise Repetitions and Resistance Comments  L SLR  25 reps   Bridge with march 20 reps    Hip ADD isometric  5 second hold x 3 minutes   Supine clams  Green t-band x 3 minutes   Supine hip ABD  20 reps   Nustep L5 x 16 minutes   Rocker board 4 minutes    Blank cell = exercise not performed today                                     10/5    EXERCISE LOG  LT LE  Exercise Repetitions and Resistance Comments  Rocker board  5 minutes and balance   SLR     Supine hip ADD isometric    Supine clams    Standing hip ABD  3x10 reps each  Pain  RT  hip  Standing Hip flexion LT LE 3x10   Nustep L4-5 x 16 minutes LLE and UE only  Step up 6" step;  3 x10 Alternating LE  Tandem balance  4 x 1 minute each    Squatting     Marching on foam   Intermittent UE support  LAQs 3# 3x10 pause at top    Blank cell = exercise not performed today                                    9/29 EXERCISE LOG  Exercise Repetitions and Resistance Comments  Nustep L1 x 2 minutes  Limited by right hip pain                    Blank cell = exercise not performed today    PATIENT EDUCATION:  Education details: prognosis, POC, healing  Person educated: Patient Education method: Explanation Education comprehension: verbalized understanding   HOME EXERCISE PROGRAM: Reviewed HEP provided by referring physician. He was able to recall these with minimal difficulty.   ASSESSMENT:  CLINICAL IMPRESSION: Patient was progressed with progressed with multiple new and familiar interventions for improved lower extremity strength and stability with moderate difficulty. He required minimal cueing with bridging for proper exercise performance to facilitate hip stability. He reported no left hip pain or discomfort with any of today's interventions. He was able to safely ambulate throughout the clinic without an assistive device with minimal gait deviations. He reported feeling alright upon the conclusion of treatment. He continues to require skilled physical therapy to address his remaining impairments to return to his prior level of function.    OBJECTIVE IMPAIRMENTS Abnormal gait, decreased activity tolerance, decreased mobility, difficulty walking, decreased ROM, decreased strength, and pain.   ACTIVITY LIMITATIONS carrying, lifting, bending, sitting,  standing, squatting, stairs, transfers, bed mobility, dressing, hygiene/grooming, locomotion level, and caring for others  PARTICIPATION LIMITATIONS: cleaning, shopping, community activity, occupation, and yard work  PERSONAL FACTORS 3+ comorbidities: HTN, ankylosing spondylitis, depression, and obesity  are also affecting patient's functional outcome.   REHAB POTENTIAL: Good  CLINICAL DECISION MAKING: Stable/uncomplicated  EVALUATION COMPLEXITY: Low   GOALS:  Goals reviewed with patient? No  SHORT TERM GOALS: Target date: 07/15/2022  Patient will be independent with his initial HEP.  Baseline: Goal status: INITIAL  2.  Patient will be able to safely ambulate at least 80 feet without an assistive device.  Baseline:  Goal status: INITIAL  3.  Patient will be able to complete his daily activities without his familiar left pain exceeding 4/10.  Baseline:  Goal status: INITIAL  LONG TERM GOALS: Target date: 08/05/2022   Patient will be independent with his advanced HEP. Baseline:  Goal status: INITIAL  2.  Patient will be able to safely ambulate without significant gait deviations.  Baseline:  Goal status: INITIAL  3.  Patient will be able to complete his daily activities without his familiar left hip pain exceeding 2/10.  Baseline:  Goal status: INITIAL  4.  Patient will be able to navigate at least 3 steps without assistance for improved household mobility.  Baseline: currently requires help from his wife and the rolling walker Goal status: INITIAL  PLAN: PT FREQUENCY: 2x/week  PT DURATION: 6 weeks  PLANNED INTERVENTIONS: Therapeutic exercises, Therapeutic activity, Neuromuscular re-education, Balance training, Gait training, Patient/Family education, Self Care, Joint mobilization, Stair training, Electrical stimulation, Cryotherapy, Moist heat, Manual therapy, and Re-evaluation  PLAN FOR NEXT SESSION: nustep (as able, limited by right hip pain), lower extremity  strengthening such as SLR, and modalities as needed    Darlin Coco, PT 07/04/2022, 4:11 PM

## 2022-07-06 ENCOUNTER — Ambulatory Visit: Payer: 59

## 2022-07-06 DIAGNOSIS — R262 Difficulty in walking, not elsewhere classified: Secondary | ICD-10-CM

## 2022-07-06 DIAGNOSIS — M25552 Pain in left hip: Secondary | ICD-10-CM | POA: Diagnosis not present

## 2022-07-06 NOTE — Therapy (Signed)
OUTPATIENT PHYSICAL THERAPY LOWER EXTREMITY TREATMENT   Patient Name: Dean Ferguson MRN: [id number] DOB:Sep 19, 1986, 36 y.o., male Today's Date: 07/06/2022   PT End of Session - 07/06/22 1307     Visit Number 5    Number of Visits 12    Date for PT Re-Evaluation 08/19/22    PT Start Time 1300    PT Stop Time 1345    PT Time Calculation (min) 45 min    Activity Tolerance Patient tolerated treatment well    Behavior During Therapy [facility] for tasks assessed/performed               Past Medical History:  Diagnosis Date   Anxiety    Arthritis    Fatty liver 07/15/2019   GERD (gastroesophageal reflux disease)    History of kidney stones    Sleep apnea    cpap   Past Surgical History:  Procedure Laterality Date   benign cyst removed from tongue      LEG SURGERY Right    [place]   right foot surgery      SHOULDER SURGERY Right    [place] Left 06/22/2022   Procedure: TOTAL HIP ARTHROPLASTY;  Surgeon: Willaim Sheng, MD;  Location: [facility];  Service: Orthopedics;  Laterality: Left;   Patient Active Problem List   Diagnosis Date Noted   Ankylosing spondylitis ([place]) 02/24/2022   GAD (generalized anxiety disorder) 02/03/2020   Obesity (BMI 30-39.9) 02/03/2020   Alcohol abuse 02/03/2020   Elevated liver enzymes 02/03/2020   Skin tag 10/15/2019   Fatty liver 07/15/2019   HLA B27 (HLA B27 positive) 12/28/2018   Iritis of left eye 12/28/2018   Chronic back pain 12/28/2018   Severe obstructive sleep apnea-hypopnea syndrome 10/05/2018   Essential hypertension 10/01/2018   OSA (obstructive sleep apnea) 07/23/2018   Psychophysiological insomnia 07/23/2018   Depression, major, single episode, mild ([place]) 06/24/2018   Gout 05/28/2018   Snoring 05/24/2018    PCP: Sharion Balloon, FNP  REFERRING PROVIDER: Willaim Sheng, MD  REFERRING DIAG: Post OP Left Total Hip Replacement-DOS 06/22/22  THERAPY DIAG:  Pain in  left hip  Difficulty in walking, not elsewhere classified  Rationale for Evaluation and Treatment Rehabilitation  ONSET DATE: 06/22/22  SUBJECTIVE:   SUBJECTIVE STATEMENT: Patient reports that his left hip feels alright today, but his right hip is really hurting.   PERTINENT HISTORY: Planning to have right hip replaced later this year, HTN, ankylosing spondylitis, depression, and obesity  PAIN:  Are you having pain? Yes: NPRS scale: 2/10 Pain location: left hip along incision Pain description: pulling, sore Aggravating factors: sitting, movement Relieving factors: ice and medication  PRECAUTIONS: None, per operative report   WEIGHT BEARING RESTRICTIONS No  FALLS:  Has patient fallen in last 6 months? No  LIVING ENVIRONMENT: Lives with: lives with their family Lives in: House/apartment Stairs: Yes: Internal: does not go upstairs steps; can reach both and External: 3  steps; none step to pattern Has following equipment at home: Gilford Rile - 2 wheeled  OCCUPATION: Full time at [employer], but working from home currently  PLOF: Independent  PATIENT GOALS walking without assistive device, normal daily activities, play with his kids in the yard  NEXT MD FOLLOW UP: 07/07/22   OBJECTIVE: all objective measures were performed at his initial evaluation on 06/24/22 unless otherwise noted  DIAGNOSTIC FINDINGS:   06/22/22 hip x-ray There is interval left hip arthroplasty. There are pockets  of air in the soft tissues. There is deformity in the head of the right femur suggesting possible avascular necrosis with fracture. TODAY'S TREATMENT:                                   10/11 EXERCISE LOG  Exercise Repetitions and Resistance Comments  Nustep  L7 x 15 minutes   L SLR 30 reps   Supine kick outs 25 reps w/ 3 lbs ankle weight   Bridge  30 reps   Supine hip ABD  20 reps   Sidelying hip flexion and extension 15 reps  Limited by right hip pain   Cybex knee flexion  40# x 2  minutes  LLE only  Cybex knee extension 40# x 20 reps   Cybex leg press 2 plates; seat 9; 2 minutes    Blank cell = exercise not performed today                                    10/9 EXERCISE LOG  Exercise Repetitions and Resistance Comments  L SLR  25 reps   Bridge with march 20 reps    Hip ADD isometric  5 second hold x 3 minutes   Supine clams  Green t-band x 3 minutes   Supine hip ABD  20 reps   Nustep L5 x 16 minutes   Rocker board 4 minutes    Blank cell = exercise not performed today                                    10/5    EXERCISE LOG  LT LE  Exercise Repetitions and Resistance Comments  Rocker board  5 minutes and balance   SLR     Supine hip ADD isometric    Supine clams    Standing hip ABD  3x10 reps each  Pain  RT  hip  Standing Hip flexion LT LE 3x10   Nustep L4-5 x 16 minutes LLE and UE only  Step up 6" step;  3 x10 Alternating LE  Tandem balance  4 x 1 minute each    Squatting     Marching on foam   Intermittent UE support  LAQs 3# 3x10 pause at top    Blank cell = exercise not performed today   PATIENT EDUCATION:  Education details: prognosis, POC, healing  Person educated: Patient Education method: Explanation Education comprehension: verbalized understanding   HOME EXERCISE PROGRAM: Reviewed HEP provided by referring physician. He was able to recall these with minimal difficulty.   ASSESSMENT:  CLINICAL IMPRESSION: Patient was progressed with multiple new interventions for improved lower extremity strength with moderate difficulty. He required minimal cueing with straight leg raises to promote terminal knee extension. He reported no pain or discomfort with any of today's interventions. He reported feeling good upon the conclusion of treatment. Recommend that he continue with skilled physical therapy to address his remaining impairments to return to his prior level of function.    OBJECTIVE IMPAIRMENTS Abnormal gait, decreased activity  tolerance, decreased mobility, difficulty walking, decreased ROM, decreased strength, and pain.   ACTIVITY LIMITATIONS carrying, lifting, bending, sitting, standing, squatting, stairs, transfers, bed mobility, dressing, hygiene/grooming, locomotion level, and caring for others  PARTICIPATION LIMITATIONS: cleaning, shopping, community  activity, occupation, and yard work  PERSONAL FACTORS 3+ comorbidities: HTN, ankylosing spondylitis, depression, and obesity  are also affecting patient's functional outcome.   REHAB POTENTIAL: Good  CLINICAL DECISION MAKING: Stable/uncomplicated  EVALUATION COMPLEXITY: Low   GOALS: Goals reviewed with patient? No  SHORT TERM GOALS: Target date: 07/15/2022  Patient will be independent with his initial HEP.  Baseline: Goal status: INITIAL  2.  Patient will be able to safely ambulate at least 80 feet without an assistive device.  Baseline:  Goal status: INITIAL  3.  Patient will be able to complete his daily activities without his familiar left pain exceeding 4/10.  Baseline:  Goal status: INITIAL  LONG TERM GOALS: Target date: 08/05/2022   Patient will be independent with his advanced HEP. Baseline:  Goal status: INITIAL  2.  Patient will be able to safely ambulate without significant gait deviations.  Baseline:  Goal status: INITIAL  3.  Patient will be able to complete his daily activities without his familiar left hip pain exceeding 2/10.  Baseline:  Goal status: INITIAL  4.  Patient will be able to navigate at least 3 steps without assistance for improved household mobility.  Baseline: currently requires help from his wife and the rolling walker Goal status: INITIAL  PLAN: PT FREQUENCY: 2x/week  PT DURATION: 6 weeks  PLANNED INTERVENTIONS: Therapeutic exercises, Therapeutic activity, Neuromuscular re-education, Balance training, Gait training, Patient/Family education, Self Care, Joint mobilization, Stair training, Electrical  stimulation, Cryotherapy, Moist heat, Manual therapy, and Re-evaluation  PLAN FOR NEXT SESSION: nustep (as able, limited by right hip pain), lower extremity strengthening such as SLR, and modalities as needed    Darlin Coco, PT 07/06/2022, 3:15 PM

## 2022-07-11 ENCOUNTER — Ambulatory Visit: Payer: 59

## 2022-07-11 DIAGNOSIS — M25552 Pain in left hip: Secondary | ICD-10-CM | POA: Diagnosis not present

## 2022-07-11 DIAGNOSIS — R262 Difficulty in walking, not elsewhere classified: Secondary | ICD-10-CM

## 2022-07-11 NOTE — Therapy (Signed)
OUTPATIENT PHYSICAL THERAPY LOWER EXTREMITY TREATMENT   Patient Name: Dean Ferguson MRN: [id number] DOB:Jun 10, 1986, 36 y.o., male Today's Date: 07/11/2022   PT End of Session - 07/11/22 1312     Visit Number 6    Number of Visits 12    Date for PT Re-Evaluation 08/19/22    PT Start Time 1300    PT Stop Time 1343    PT Time Calculation (min) 43 min    Activity Tolerance Patient tolerated treatment well    Behavior During Therapy [facility] for tasks assessed/performed                Past Medical History:  Diagnosis Date   Anxiety    Arthritis    Fatty liver 07/15/2019   GERD (gastroesophageal reflux disease)    History of kidney stones    Sleep apnea    cpap   Past Surgical History:  Procedure Laterality Date   benign cyst removed from tongue      LEG SURGERY Right    [place]   right foot surgery      SHOULDER SURGERY Right    [place] Left 06/22/2022   Procedure: TOTAL HIP ARTHROPLASTY;  Surgeon: Willaim Sheng, MD;  Location: [facility];  Service: Orthopedics;  Laterality: Left;   Patient Active Problem List   Diagnosis Date Noted   Ankylosing spondylitis ([place]) 02/24/2022   GAD (generalized anxiety disorder) 02/03/2020   Obesity (BMI 30-39.9) 02/03/2020   Alcohol abuse 02/03/2020   Elevated liver enzymes 02/03/2020   Skin tag 10/15/2019   Fatty liver 07/15/2019   HLA B27 (HLA B27 positive) 12/28/2018   Iritis of left eye 12/28/2018   Chronic back pain 12/28/2018   Severe obstructive sleep apnea-hypopnea syndrome 10/05/2018   Essential hypertension 10/01/2018   OSA (obstructive sleep apnea) 07/23/2018   Psychophysiological insomnia 07/23/2018   Depression, major, single episode, mild ([place]) 06/24/2018   Gout 05/28/2018   Snoring 05/24/2018    PCP: Sharion Balloon, FNP  REFERRING PROVIDER: Willaim Sheng, MD  REFERRING DIAG: Post OP Left Total Hip Replacement-DOS 06/22/22  THERAPY DIAG:  Pain in  left hip  Difficulty in walking, not elsewhere classified  Rationale for Evaluation and Treatment Rehabilitation  ONSET DATE: 06/22/22  SUBJECTIVE:   SUBJECTIVE STATEMENT: Patient reports that his left hip alright, but it is itching a little due to the reaction to his bandage.   PERTINENT HISTORY: Planning to have right hip replaced later this year, HTN, ankylosing spondylitis, depression, and obesity  PAIN:  Are you having pain? Yes: NPRS scale: 0/10 Pain location: left hip along incision Pain description: pulling, sore Aggravating factors: sitting, movement Relieving factors: ice and medication  PRECAUTIONS: None, per operative report   WEIGHT BEARING RESTRICTIONS No  FALLS:  Has patient fallen in last 6 months? No  LIVING ENVIRONMENT: Lives with: lives with their family Lives in: House/apartment Stairs: Yes: Internal: does not go upstairs steps; can reach both and External: 3  steps; none step to pattern Has following equipment at home: Gilford Rile - 2 wheeled  OCCUPATION: Full time at [employer], but working from home currently  PLOF: Independent  PATIENT GOALS walking without assistive device, normal daily activities, play with his kids in the yard  NEXT MD FOLLOW UP: 07/07/22   OBJECTIVE: all objective measures were performed at his initial evaluation on 06/24/22 unless otherwise noted  DIAGNOSTIC FINDINGS:   06/22/22 hip x-ray There is interval left  hip arthroplasty. There are pockets of air in the soft tissues. There is deformity in the head of the right femur suggesting possible avascular necrosis with fracture. TODAY'S TREATMENT:                                   10/16 EXERCISE LOG  Exercise Repetitions and Resistance Comments  Nustep L7 x 15 minutes   Cybex leg press 2 plates; seat 8; 2.5 minutes BLE squatting  Cybex knee extension 40# x 2 minutes  BLE   Cybex knee flexion  40# x 2 minutes   Standing hip ABD 20 reps   Lateral step up  6" step x 20 reps  LLE only  Heel raise  2 minutes Toes on foam  Tandem balance on foam  3 x 30 seconds each  Intermittent UE support   Sit to stand  12 reps from lowered mat table    Standing hip extension  25 reps  LLE only  Step up  Onto BOSU x 2 minutes  LLE only   Blank cell = exercise not performed today                                    10/11 EXERCISE LOG  Exercise Repetitions and Resistance Comments  Nustep  L7 x 15 minutes   L SLR 30 reps   Supine kick outs 25 reps w/ 3 lbs ankle weight   Bridge  30 reps   Supine hip ABD  20 reps   Sidelying hip flexion and extension 15 reps  Limited by right hip pain   Cybex knee flexion  40# x 2 minutes  LLE only  Cybex knee extension 40# x 20 reps   Cybex leg press 2 plates; seat 9; 2 minutes    Blank cell = exercise not performed today                                    10/9 EXERCISE LOG  Exercise Repetitions and Resistance Comments  L SLR  25 reps   Bridge with march 20 reps    Hip ADD isometric  5 second hold x 3 minutes   Supine clams  Green t-band x 3 minutes   Supine hip ABD  20 reps   Nustep L5 x 16 minutes   Rocker board 4 minutes    Blank cell = exercise not performed today    PATIENT EDUCATION:  Education details: prognosis, POC, healing  Person educated: Patient Education method: Explanation Education comprehension: verbalized understanding   HOME EXERCISE PROGRAM: Reviewed HEP provided by referring physician. He was able to recall these with minimal difficulty.   ASSESSMENT:  CLINICAL IMPRESSION: Patient was progressed with multiple new and familiar interventions for improved lower extremity strength and stability with moderate difficulty and fatigue. He required minimal cueing with today's interventions for proper pacing to promote proper biomechanics. Right hip pain was his primary limitation with today's interventions. He reported feeling alright conclusion of treatment. He continues to require skilled physical therapy to  address his remaining impairments to return to his prior level of function.   OBJECTIVE IMPAIRMENTS Abnormal gait, decreased activity tolerance, decreased mobility, difficulty walking, decreased ROM, decreased strength, and pain.   ACTIVITY LIMITATIONS carrying, lifting, bending, sitting,  standing, squatting, stairs, transfers, bed mobility, dressing, hygiene/grooming, locomotion level, and caring for others  PARTICIPATION LIMITATIONS: cleaning, shopping, community activity, occupation, and yard work  PERSONAL FACTORS 3+ comorbidities: HTN, ankylosing spondylitis, depression, and obesity  are also affecting patient's functional outcome.   REHAB POTENTIAL: Good  CLINICAL DECISION MAKING: Stable/uncomplicated  EVALUATION COMPLEXITY: Low   GOALS: Goals reviewed with patient? No  SHORT TERM GOALS: Target date: 07/15/2022  Patient will be independent with his initial HEP.  Baseline: Goal status: INITIAL  2.  Patient will be able to safely ambulate at least 80 feet without an assistive device.  Baseline:  Goal status: INITIAL  3.  Patient will be able to complete his daily activities without his familiar left pain exceeding 4/10.  Baseline:  Goal status: INITIAL  LONG TERM GOALS: Target date: 08/05/2022   Patient will be independent with his advanced HEP. Baseline:  Goal status: INITIAL  2.  Patient will be able to safely ambulate without significant gait deviations.  Baseline:  Goal status: INITIAL  3.  Patient will be able to complete his daily activities without his familiar left hip pain exceeding 2/10.  Baseline:  Goal status: INITIAL  4.  Patient will be able to navigate at least 3 steps without assistance for improved household mobility.  Baseline: currently requires help from his wife and the rolling walker Goal status: INITIAL  PLAN: PT FREQUENCY: 2x/week  PT DURATION: 6 weeks  PLANNED INTERVENTIONS: Therapeutic exercises, Therapeutic activity, Neuromuscular  re-education, Balance training, Gait training, Patient/Family education, Self Care, Joint mobilization, Stair training, Electrical stimulation, Cryotherapy, Moist heat, Manual therapy, and Re-evaluation  PLAN FOR NEXT SESSION: nustep (as able, limited by right hip pain), lower extremity strengthening such as SLR, and modalities as needed    Darlin Coco, PT 07/11/2022, 1:56 PM

## 2022-07-13 ENCOUNTER — Ambulatory Visit: Payer: 59

## 2022-07-13 DIAGNOSIS — M25552 Pain in left hip: Secondary | ICD-10-CM

## 2022-07-13 DIAGNOSIS — R262 Difficulty in walking, not elsewhere classified: Secondary | ICD-10-CM

## 2022-07-13 NOTE — Therapy (Signed)
OUTPATIENT PHYSICAL THERAPY LOWER EXTREMITY TREATMENT   Patient Name: Dean Ferguson MRN: [id number] DOB:Jan 12, 1986, 36 y.o., male Today's Date: 07/13/2022   PT End of Session - 07/13/22 1316     Visit Number 7    Number of Visits 12    Date for PT Re-Evaluation 08/19/22    PT Start Time 8032    PT Stop Time 1344    PT Time Calculation (min) 46 min    Activity Tolerance Patient tolerated treatment well    Behavior During Therapy [facility] for tasks assessed/performed                 Past Medical History:  Diagnosis Date   Anxiety    Arthritis    Fatty liver 07/15/2019   GERD (gastroesophageal reflux disease)    History of kidney stones    Sleep apnea    cpap   Past Surgical History:  Procedure Laterality Date   benign cyst removed from tongue      LEG SURGERY Right    [place]   right foot surgery      SHOULDER SURGERY Right    [place] Left 06/22/2022   Procedure: TOTAL HIP ARTHROPLASTY;  Surgeon: Willaim Sheng, MD;  Location: [facility];  Service: Orthopedics;  Laterality: Left;   Patient Active Problem List   Diagnosis Date Noted   Ankylosing spondylitis ([place]) 02/24/2022   GAD (generalized anxiety disorder) 02/03/2020   Obesity (BMI 30-39.9) 02/03/2020   Alcohol abuse 02/03/2020   Elevated liver enzymes 02/03/2020   Skin tag 10/15/2019   Fatty liver 07/15/2019   HLA B27 (HLA B27 positive) 12/28/2018   Iritis of left eye 12/28/2018   Chronic back pain 12/28/2018   Severe obstructive sleep apnea-hypopnea syndrome 10/05/2018   Essential hypertension 10/01/2018   OSA (obstructive sleep apnea) 07/23/2018   Psychophysiological insomnia 07/23/2018   Depression, major, single episode, mild ([place]) 06/24/2018   Gout 05/28/2018   Snoring 05/24/2018    PCP: Sharion Balloon, FNP  REFERRING PROVIDER: Willaim Sheng, MD  REFERRING DIAG: Post OP Left Total Hip Replacement-DOS 06/22/22  THERAPY DIAG:  Pain  in left hip  Difficulty in walking, not elsewhere classified  Rationale for Evaluation and Treatment Rehabilitation  ONSET DATE: 06/22/22  SUBJECTIVE:   SUBJECTIVE STATEMENT: Patient reports that his hips were a little sore after his last appointment. However, his right hip was primarily the one bothering him.   PERTINENT HISTORY: Planning to have right hip replaced later this year, HTN, ankylosing spondylitis, depression, and obesity  PAIN:  Are you having pain? Yes: NPRS scale: 0/10 Pain location: left hip along incision Pain description: pulling, sore Aggravating factors: sitting, movement Relieving factors: ice and medication  PRECAUTIONS: None, per operative report   WEIGHT BEARING RESTRICTIONS No  FALLS:  Has patient fallen in last 6 months? No  LIVING ENVIRONMENT: Lives with: lives with their family Lives in: House/apartment Stairs: Yes: Internal: does not go upstairs steps; can reach both and External: 3  steps; none step to pattern Has following equipment at home: Gilford Rile - 2 wheeled  OCCUPATION: Full time at [employer], but working from home currently  PLOF: Independent  PATIENT GOALS walking without assistive device, normal daily activities, play with his kids in the yard  NEXT MD FOLLOW UP: 07/07/22   OBJECTIVE: all objective measures were performed at his initial evaluation on 06/24/22 unless otherwise noted  DIAGNOSTIC FINDINGS:   06/22/22 hip x-ray  There is interval left hip arthroplasty. There are pockets of air in the soft tissues. There is deformity in the head of the right femur suggesting possible avascular necrosis with fracture. TODAY'S TREATMENT:                                   10/18 EXERCISE LOG  Exercise Repetitions and Resistance Comments  Nustep  L8 x 15 minutes    Cybex leg press  2 plates; seat 7 x 3 minutes   Donkey kicks 30 reps LLE only   Cybex knee extension 40# x 3 x 15 reps   Cybex knee flexion  40# x 2.5 minutes   Rocker  board 5 minutes   Standing hip ABD 35 reps  LLE  Tandem balance on foam  4 x 30 seconds each  With therapist perturbations; intermittent UE support  Static stance on BOSU  Ball down x 2 minutes   Step up  Onto BOSU x 30 reps  LLE only   Blank cell = exercise not performed today                                    10/16 EXERCISE LOG  Exercise Repetitions and Resistance Comments  Nustep L7 x 15 minutes   Cybex leg press 2 plates; seat 8; 2.5 minutes BLE squatting  Cybex knee extension 40# x 2 minutes  BLE   Cybex knee flexion  40# x 2 minutes   Standing hip ABD 20 reps   Lateral step up  6" step x 20 reps LLE only  Heel raise  2 minutes Toes on foam  Tandem balance on foam  3 x 30 seconds each  Intermittent UE support   Sit to stand  12 reps from lowered mat table    Standing hip extension  25 reps  LLE only  Step up  Onto BOSU x 2 minutes  LLE only   Blank cell = exercise not performed today                                    10/11 EXERCISE LOG  Exercise Repetitions and Resistance Comments  Nustep  L7 x 15 minutes   L SLR 30 reps   Supine kick outs 25 reps w/ 3 lbs ankle weight   Bridge  30 reps   Supine hip ABD  20 reps   Sidelying hip flexion and extension 15 reps  Limited by right hip pain   Cybex knee flexion  40# x 2 minutes  LLE only  Cybex knee extension 40# x 20 reps   Cybex leg press 2 plates; seat 9; 2 minutes    Blank cell = exercise not performed today   PATIENT EDUCATION:  Education details: prognosis, POC, healing  Person educated: Patient Education method: Explanation Education comprehension: verbalized understanding   HOME EXERCISE PROGRAM: Reviewed HEP provided by referring physician. He was able to recall these with minimal difficulty.   ASSESSMENT:  CLINICAL IMPRESSION: Patient was progressed with multiple familiar interventions for improved lower extremity strength with moderate difficulty. He required minimal cueing with today's interventions for  proper pacing to promote increased muscular demand. He reported no left hip pain or discomfort with any of today's interventions. He reported feeling alright  upon the conclusion of treatment. He continues to require skilled physical therapy to address his remaining impairments to his prior level of function.   OBJECTIVE IMPAIRMENTS Abnormal gait, decreased activity tolerance, decreased mobility, difficulty walking, decreased ROM, decreased strength, and pain.   ACTIVITY LIMITATIONS carrying, lifting, bending, sitting, standing, squatting, stairs, transfers, bed mobility, dressing, hygiene/grooming, locomotion level, and caring for others  PARTICIPATION LIMITATIONS: cleaning, shopping, community activity, occupation, and yard work  PERSONAL FACTORS 3+ comorbidities: HTN, ankylosing spondylitis, depression, and obesity  are also affecting patient's functional outcome.   REHAB POTENTIAL: Good  CLINICAL DECISION MAKING: Stable/uncomplicated  EVALUATION COMPLEXITY: Low   GOALS: Goals reviewed with patient? No  SHORT TERM GOALS: Target date: 07/15/2022  Patient will be independent with his initial HEP.  Baseline: Goal status: INITIAL  2.  Patient will be able to safely ambulate at least 80 feet without an assistive device.  Baseline:  Goal status: INITIAL  3.  Patient will be able to complete his daily activities without his familiar left pain exceeding 4/10.  Baseline:  Goal status: INITIAL  LONG TERM GOALS: Target date: 08/05/2022   Patient will be independent with his advanced HEP. Baseline:  Goal status: INITIAL  2.  Patient will be able to safely ambulate without significant gait deviations.  Baseline:  Goal status: INITIAL  3.  Patient will be able to complete his daily activities without his familiar left hip pain exceeding 2/10.  Baseline:  Goal status: INITIAL  4.  Patient will be able to navigate at least 3 steps without assistance for improved household mobility.   Baseline: currently requires help from his wife and the rolling walker Goal status: INITIAL  PLAN: PT FREQUENCY: 2x/week  PT DURATION: 6 weeks  PLANNED INTERVENTIONS: Therapeutic exercises, Therapeutic activity, Neuromuscular re-education, Balance training, Gait training, Patient/Family education, Self Care, Joint mobilization, Stair training, Electrical stimulation, Cryotherapy, Moist heat, Manual therapy, and Re-evaluation  PLAN FOR NEXT SESSION: nustep (as able, limited by right hip pain), lower extremity strengthening such as SLR, and modalities as needed    Darlin Coco, PT 07/13/2022, 3:29 PM

## 2022-07-18 ENCOUNTER — Encounter: Payer: Self-pay | Admitting: Physical Therapy

## 2022-07-18 ENCOUNTER — Ambulatory Visit: Payer: 59 | Admitting: Physical Therapy

## 2022-07-18 DIAGNOSIS — M25552 Pain in left hip: Secondary | ICD-10-CM | POA: Diagnosis not present

## 2022-07-18 DIAGNOSIS — R262 Difficulty in walking, not elsewhere classified: Secondary | ICD-10-CM

## 2022-07-18 NOTE — Therapy (Signed)
OUTPATIENT PHYSICAL THERAPY LOWER EXTREMITY TREATMENT   Patient Name: Dean Ferguson MRN: [id number] DOB:Dec 09, 1985, 36 y.o., male Today's Date: 07/18/2022   PT End of Session - 07/18/22 1300     Visit Number 8    Number of Visits 12    Date for PT Re-Evaluation 08/19/22    PT Start Time 1259    PT Stop Time 1339    PT Time Calculation (min) 40 min    Activity Tolerance Patient tolerated treatment well    Behavior During Therapy [facility] for tasks assessed/performed            Past Medical History:  Diagnosis Date   Anxiety    Arthritis    Fatty liver 07/15/2019   GERD (gastroesophageal reflux disease)    History of kidney stones    Sleep apnea    cpap   Past Surgical History:  Procedure Laterality Date   benign cyst removed from tongue      LEG SURGERY Right    [place]   right foot surgery      SHOULDER SURGERY Right    [place] Left 06/22/2022   Procedure: TOTAL HIP ARTHROPLASTY;  Surgeon: Willaim Sheng, MD;  Location: [facility];  Service: Orthopedics;  Laterality: Left;   Patient Active Problem List   Diagnosis Date Noted   Ankylosing spondylitis ([place]) 02/24/2022   GAD (generalized anxiety disorder) 02/03/2020   Obesity (BMI 30-39.9) 02/03/2020   Alcohol abuse 02/03/2020   Elevated liver enzymes 02/03/2020   Skin tag 10/15/2019   Fatty liver 07/15/2019   HLA B27 (HLA B27 positive) 12/28/2018   Iritis of left eye 12/28/2018   Chronic back pain 12/28/2018   Severe obstructive sleep apnea-hypopnea syndrome 10/05/2018   Essential hypertension 10/01/2018   OSA (obstructive sleep apnea) 07/23/2018   Psychophysiological insomnia 07/23/2018   Depression, major, single episode, mild ([place]) 06/24/2018   Gout 05/28/2018   Snoring 05/24/2018   PCP: Sharion Balloon, FNP  REFERRING PROVIDER: Willaim Sheng, MD  REFERRING DIAG: Post OP Left Total Hip Replacement-DOS 06/22/22  THERAPY DIAG:  Pain in left  hip  Difficulty in walking, not elsewhere classified  Rationale for Evaluation and Treatment Rehabilitation  ONSET DATE: 06/22/22  SUBJECTIVE:   SUBJECTIVE STATEMENT: Had more pain with preparing for his son's birthday party and did way too much. Goes back to work on Monday.  PERTINENT HISTORY: Planning to have right hip replaced later this year, HTN, ankylosing spondylitis, depression, and obesity  PAIN:  Are you having pain? Yes: NPRS scale: no number provided/10 Pain location: left hip along incision Pain description: pulling, sore Aggravating factors: sitting, movement Relieving factors: ice and medication  PRECAUTIONS: None, per operative report   PATIENT GOALS walking without assistive device, normal daily activities, play with his kids in the yard  NEXT MD FOLLOW UP: 08/11/2022  OBJECTIVE: all objective measures were performed at his initial evaluation on 06/24/22 unless otherwise noted  DIAGNOSTIC FINDINGS:   06/22/22 hip x-ray There is interval left hip arthroplasty. There are pockets of air in the soft tissues. There is deformity in the head of the right femur suggesting possible avascular necrosis with fracture.  TODAY'S TREATMENT:                                   10/23 EXERCISE LOG  Exercise Repetitions and Resistance Comments  Nustep  L8 x 15 minutes    Cybex leg press  2 plates; seat 7 x 3 minutes   Donkey kicks 30 reps LLE only   Cybex knee extension 30# 3x10 reps   Cybex knee flexion  40# 3x10 reps   Mini squats on BOSU X15 reps   Sidestepping on beam X6 RT   Tandem balance on foam  X3 min   Static stance on BOSU  Ball down x 2 minutes    Blank cell = exercise not performed today   PATIENT EDUCATION:  Education details: prognosis, POC, healing  Person educated: Patient Education method: Explanation Education comprehension: verbalized understanding  HOME EXERCISE PROGRAM: Reviewed HEP provided by referring physician. He was able to recall these  with minimal difficulty.   ASSESSMENT:  CLINICAL IMPRESSION: Patient feeling more discomfort today after preparing for his son's birthday party this weekend. Patient feels that his greatest limitation currently is the  R non operated hip at this time but that it has been scheduled. Patient was able to complete all therex and balance activities well with no complaints of increased pain.  OBJECTIVE IMPAIRMENTS Abnormal gait, decreased activity tolerance, decreased mobility, difficulty walking, decreased ROM, decreased strength, and pain.   ACTIVITY LIMITATIONS carrying, lifting, bending, sitting, standing, squatting, stairs, transfers, bed mobility, dressing, hygiene/grooming, locomotion level, and caring for others  PARTICIPATION LIMITATIONS: cleaning, shopping, community activity, occupation, and yard work  PERSONAL FACTORS 3+ comorbidities: HTN, ankylosing spondylitis, depression, and obesity  are also affecting patient's functional outcome.   REHAB POTENTIAL: Good  CLINICAL DECISION MAKING: Stable/uncomplicated  EVALUATION COMPLEXITY: Low  GOALS: Goals reviewed with patient? No  SHORT TERM GOALS: Target date: 07/15/2022  Patient will be independent with his initial HEP.  Baseline: Goal status: On-going  2.  Patient will be able to safely ambulate at least 80 feet without an assistive device.  Baseline:  Goal status: MET  3.  Patient will be able to complete his daily activities without his familiar left pain exceeding 4/10.  Baseline:  Goal status: On-going  LONG TERM GOALS: Target date: 08/05/2022   Patient will be independent with his advanced HEP. Baseline:  Goal status: On-going  2.  Patient will be able to safely ambulate without significant gait deviations.  Baseline:  Goal status: MET  3.  Patient will be able to complete his daily activities without his familiar left hip pain exceeding 2/10.  Baseline:  Goal status: On-going  4.  Patient will be able to  navigate at least 3 steps without assistance for improved household mobility.  Baseline: currently requires help from his wife and the rolling walker Goal status: On-going  PLAN: PT FREQUENCY: 2x/week  PT DURATION: 6 weeks  PLANNED INTERVENTIONS: Therapeutic exercises, Therapeutic activity, Neuromuscular re-education, Balance training, Gait training, Patient/Family education, Self Care, Joint mobilization, Stair training, Electrical stimulation, Cryotherapy, Moist heat, Manual therapy, and Re-evaluation  PLAN FOR NEXT SESSION: nustep (as able, limited by right hip pain), lower extremity strengthening such as SLR, and modalities as needed   Standley Brooking, PTA 07/18/2022, 1:57 PM

## 2022-07-19 ENCOUNTER — Other Ambulatory Visit: Payer: Self-pay | Admitting: Family

## 2022-07-19 DIAGNOSIS — F5104 Psychophysiologic insomnia: Secondary | ICD-10-CM

## 2022-07-19 DIAGNOSIS — F320 Major depressive disorder, single episode, mild: Secondary | ICD-10-CM

## 2022-07-19 DIAGNOSIS — F411 Generalized anxiety disorder: Secondary | ICD-10-CM

## 2022-07-20 ENCOUNTER — Ambulatory Visit: Payer: 59

## 2022-07-20 DIAGNOSIS — M25552 Pain in left hip: Secondary | ICD-10-CM | POA: Diagnosis not present

## 2022-07-20 DIAGNOSIS — R262 Difficulty in walking, not elsewhere classified: Secondary | ICD-10-CM

## 2022-07-20 NOTE — Therapy (Signed)
OUTPATIENT PHYSICAL THERAPY LOWER EXTREMITY TREATMENT   Patient Name: Dean Ferguson MRN: [id number] DOB:05-Dec-1985, 36 y.o., male Today's Date: 07/20/2022   PT End of Session - 07/20/22 1316     Visit Number 9    Number of Visits 12    Date for PT Re-Evaluation 08/19/22    PT Start Time 1300    PT Stop Time 1345    PT Time Calculation (min) 45 min    Activity Tolerance Patient tolerated treatment well    Behavior During Therapy [facility] for tasks assessed/performed             Past Medical History:  Diagnosis Date   Anxiety    Arthritis    Fatty liver 07/15/2019   GERD (gastroesophageal reflux disease)    History of kidney stones    Sleep apnea    cpap   Past Surgical History:  Procedure Laterality Date   benign cyst removed from tongue      LEG SURGERY Right    [place]   right foot surgery      SHOULDER SURGERY Right    [place] Left 06/22/2022   Procedure: TOTAL HIP ARTHROPLASTY;  Surgeon: Willaim Sheng, MD;  Location: [facility];  Service: Orthopedics;  Laterality: Left;   Patient Active Problem List   Diagnosis Date Noted   Ankylosing spondylitis ([place]) 02/24/2022   GAD (generalized anxiety disorder) 02/03/2020   Obesity (BMI 30-39.9) 02/03/2020   Alcohol abuse 02/03/2020   Elevated liver enzymes 02/03/2020   Skin tag 10/15/2019   Fatty liver 07/15/2019   HLA B27 (HLA B27 positive) 12/28/2018   Iritis of left eye 12/28/2018   Chronic back pain 12/28/2018   Severe obstructive sleep apnea-hypopnea syndrome 10/05/2018   Essential hypertension 10/01/2018   OSA (obstructive sleep apnea) 07/23/2018   Psychophysiological insomnia 07/23/2018   Depression, major, single episode, mild ([place]) 06/24/2018   Gout 05/28/2018   Snoring 05/24/2018   PCP: Sharion Balloon, FNP  REFERRING PROVIDER: Willaim Sheng, MD  REFERRING DIAG: Post OP Left Total Hip Replacement-DOS 06/22/22  THERAPY DIAG:  Pain in left  hip  Difficulty in walking, not elsewhere classified  Rationale for Evaluation and Treatment Rehabilitation  ONSET DATE: 06/22/22  SUBJECTIVE:   SUBJECTIVE STATEMENT: Patient reports that his hip feels alright today, but he is still "paying for his weekend."   PERTINENT HISTORY: Planning to have right hip replaced later this year, HTN, ankylosing spondylitis, depression, and obesity  PAIN:  Are you having pain? Yes: NPRS scale: 0-1 (left hip) 4-5 (right hip)/10 Pain location: left hip along incision Pain description: pulling, sore Aggravating factors: sitting, movement Relieving factors: ice and medication  PRECAUTIONS: None, per operative report   PATIENT GOALS walking without assistive device, normal daily activities, play with his kids in the yard  NEXT MD FOLLOW UP: 08/11/2022  OBJECTIVE: all objective measures were performed at his initial evaluation on 06/24/22 unless otherwise noted  DIAGNOSTIC FINDINGS:   06/22/22 hip x-ray There is interval left hip arthroplasty. There are pockets of air in the soft tissues. There is deformity in the head of the right femur suggesting possible avascular necrosis with fracture.  TODAY'S TREATMENT:                                   10/25 EXERCISE LOG  Exercise Repetitions and Resistance Comments  Nustep  L7 x 15 minutes    Cybex leg press 2 plates; seat 6 x 3 minutes   Cybex knee extension 40# x 3 minutes   Cybex knee flexion 40# x 3 minutes    Lunges  30 reps each   SLS on BOSU 2 minutes    Side stepping on foam 3 minutes     Blank cell = exercise not performed today                                    10/23 EXERCISE LOG  Exercise Repetitions and Resistance Comments  Nustep  L8 x 15 minutes    Cybex leg press  2 plates; seat 7 x 3 minutes   Donkey kicks 30 reps LLE only   Cybex knee extension 30# 3x10 reps   Cybex knee flexion  40# 3x10 reps   Mini squats on BOSU X15 reps   Sidestepping on beam X6 RT   Tandem balance  on foam  X3 min   Static stance on BOSU  Ball down x 2 minutes    Blank cell = exercise not performed today   PATIENT EDUCATION:  Education details: x-ray results, healing, return to work, Licensed conveyancer educated: Patient Education method: Explanation Education comprehension: verbalized understanding  HOME EXERCISE PROGRAM: Reviewed HEP provided by referring physician. He was able to recall these with minimal difficulty.   ASSESSMENT:  CLINICAL IMPRESSION: Treatment focused on familiar interventions for improved hip strength with moderate difficulty. He required minimal cueing with proper pacing for today's interventions to avoid rushing and compensating during these interventions. He reported no left hip pain or discomfort with any of today's interventions. He reported feeling alright upon the conclusion of treatment. Recommend that he continue with skilled physical therapy to address his remaining impairments to return to his prior level of function.   OBJECTIVE IMPAIRMENTS Abnormal gait, decreased activity tolerance, decreased mobility, difficulty walking, decreased ROM, decreased strength, and pain.   ACTIVITY LIMITATIONS carrying, lifting, bending, sitting, standing, squatting, stairs, transfers, bed mobility, dressing, hygiene/grooming, locomotion level, and caring for others  PARTICIPATION LIMITATIONS: cleaning, shopping, community activity, occupation, and yard work  PERSONAL FACTORS 3+ comorbidities: HTN, ankylosing spondylitis, depression, and obesity  are also affecting patient's functional outcome.   REHAB POTENTIAL: Good  CLINICAL DECISION MAKING: Stable/uncomplicated  EVALUATION COMPLEXITY: Low  GOALS: Goals reviewed with patient? No  SHORT TERM GOALS: Target date: 07/15/2022  Patient will be independent with his initial HEP.  Baseline: Goal status: On-going  2.  Patient will be able to safely ambulate at least 80 feet without an assistive device.  Baseline:   Goal status: MET  3.  Patient will be able to complete his daily activities without his familiar left pain exceeding 4/10.  Baseline:  Goal status: On-going  LONG TERM GOALS: Target date: 08/05/2022   Patient will be independent with his advanced HEP. Baseline:  Goal status: On-going  2.  Patient will be able to safely ambulate without significant gait deviations.  Baseline:  Goal status: MET  3.  Patient will be able to complete his daily activities without his familiar left hip pain exceeding 2/10.  Baseline:  Goal status: On-going  4.  Patient will be able to navigate at least 3 steps without assistance for improved household mobility.  Baseline: currently requires help from his wife and the rolling walker Goal status: On-going  PLAN: PT FREQUENCY: 2x/week  PT DURATION: 6 weeks  PLANNED INTERVENTIONS: Therapeutic exercises, Therapeutic activity, Neuromuscular re-education, Balance training, Gait training, Patient/Family education, Self Care, Joint mobilization, Stair training, Electrical stimulation, Cryotherapy, Moist heat, Manual therapy, and Re-evaluation  PLAN FOR NEXT SESSION: nustep (as able, limited by right hip pain), lower extremity strengthening such as SLR, and modalities as needed   Darlin Coco, PT 07/20/2022, 2:58 PM

## 2022-07-25 ENCOUNTER — Ambulatory Visit: Payer: 59

## 2022-07-25 DIAGNOSIS — M25552 Pain in left hip: Secondary | ICD-10-CM | POA: Diagnosis not present

## 2022-07-25 DIAGNOSIS — R262 Difficulty in walking, not elsewhere classified: Secondary | ICD-10-CM

## 2022-07-25 NOTE — Therapy (Addendum)
OUTPATIENT PHYSICAL THERAPY LOWER EXTREMITY TREATMENT   Patient Name: Dean Ferguson MRN: [id number] DOB:12-23-1985, 36 y.o., male Today's Date: 07/25/2022   PT End of Session - 07/25/22 1300     Visit Number 10    Number of Visits 12    Date for PT Re-Evaluation 08/19/22    PT Start Time 1259    PT Stop Time 1329    PT Time Calculation (min) 30 min    Activity Tolerance Patient tolerated treatment well    Behavior During Therapy [facility] for tasks assessed/performed              Past Medical History:  Diagnosis Date   Anxiety    Arthritis    Fatty liver 07/15/2019   GERD (gastroesophageal reflux disease)    History of kidney stones    Sleep apnea    cpap   Past Surgical History:  Procedure Laterality Date   benign cyst removed from tongue      LEG SURGERY Right    [place]   right foot surgery      SHOULDER SURGERY Right    [place] Left 06/22/2022   Procedure: TOTAL HIP ARTHROPLASTY;  Surgeon: Willaim Sheng, MD;  Location: [facility];  Service: Orthopedics;  Laterality: Left;   Patient Active Problem List   Diagnosis Date Noted   Ankylosing spondylitis ([place]) 02/24/2022   GAD (generalized anxiety disorder) 02/03/2020   Obesity (BMI 30-39.9) 02/03/2020   Alcohol abuse 02/03/2020   Elevated liver enzymes 02/03/2020   Skin tag 10/15/2019   Fatty liver 07/15/2019   HLA B27 (HLA B27 positive) 12/28/2018   Iritis of left eye 12/28/2018   Chronic back pain 12/28/2018   Severe obstructive sleep apnea-hypopnea syndrome 10/05/2018   Essential hypertension 10/01/2018   OSA (obstructive sleep apnea) 07/23/2018   Psychophysiological insomnia 07/23/2018   Depression, major, single episode, mild ([place]) 06/24/2018   Gout 05/28/2018   Snoring 05/24/2018   PCP: Sharion Balloon, FNP  REFERRING PROVIDER: Willaim Sheng, MD  REFERRING DIAG: Post OP Left Total Hip Replacement-DOS 06/22/22  THERAPY DIAG:  Pain in left  hip  Difficulty in walking, not elsewhere classified  Rationale for Evaluation and Treatment Rehabilitation  ONSET DATE: 06/22/22  SUBJECTIVE:   SUBJECTIVE STATEMENT: Patient reports that both hips are hurting today "for some reason." He notes that he has begun to sleep on his left side at night.   PERTINENT HISTORY: Planning to have right hip replaced later this year, HTN, ankylosing spondylitis, depression, and obesity  PAIN:  Are you having pain? Yes: NPRS scale: 2-3)/10 Pain location: left hip along incision Pain description: pulling, sore Aggravating factors: sitting, movement Relieving factors: ice and medication  PRECAUTIONS: None, per operative report   PATIENT GOALS walking without assistive device, normal daily activities, play with his kids in the yard  NEXT MD FOLLOW UP: 08/11/2022  OBJECTIVE: all objective measures were performed at his initial evaluation on 06/24/22 unless otherwise noted  DIAGNOSTIC FINDINGS:   06/22/22 hip x-ray There is interval left hip arthroplasty. There are pockets of air in the soft tissues. There is deformity in the head of the right femur suggesting possible avascular necrosis with fracture.  TODAY'S TREATMENT:                                   10/30 EXERCISE LOG  Exercise Repetitions and  Resistance Comments  Nustep L9 x 15 minutes   Lunges onto BOSU 22 reps each  Side lunges were attempted, but limited due to pain               Blank cell = exercise not performed today                                    10/25 EXERCISE LOG  Exercise Repetitions and Resistance Comments  Nustep  L7 x 15 minutes    Cybex leg press 2 plates; seat 6 x 3 minutes   Cybex knee extension 40# x 3 minutes   Cybex knee flexion 40# x 3 minutes    Lunges  30 reps each   SLS on BOSU 2 minutes    Side stepping on foam 3 minutes     Blank cell = exercise not performed today                                    10/23 EXERCISE LOG  Exercise Repetitions  and Resistance Comments  Nustep  L8 x 15 minutes    Cybex leg press  2 plates; seat 7 x 3 minutes   Donkey kicks 30 reps LLE only   Cybex knee extension 30# 3x10 reps   Cybex knee flexion  40# 3x10 reps   Mini squats on BOSU X15 reps   Sidestepping on beam X6 RT   Tandem balance on foam  X3 min   Static stance on BOSU  Ball down x 2 minutes    Blank cell = exercise not performed today   PATIENT EDUCATION:  Education details: HEP, return to work Person educated: Patient Education method: Explanation Education comprehension: verbalized understanding  HOME EXERCISE PROGRAM: Reviewed HEP provided by referring physician. He was able to recall these with minimal difficulty.   ASSESSMENT:  CLINICAL IMPRESSION: Patient has made excellent progress with skilled physical therapy as he was able to meet all of his goals for skilled physical therapy. His HEP was reviewed and he reported feeling comfortable with these interventions. He will be placed on hold until he returns to work and barring any setbacks he will be discharged to his HEP.   PHYSICAL THERAPY DISCHARGE SUMMARY  Visits from Start of Care: 10  Current functional level related to goals / functional outcomes: Patient was able to meet all of his goals for skilled physical therapy.    Remaining deficits: none   Education / Equipment: HEP    Patient agrees to discharge. Patient goals were met. Patient is being discharged due to meeting the stated rehab goals.   Jacqulynn Cadet, PT, DPT   OBJECTIVE IMPAIRMENTS Abnormal gait, decreased activity tolerance, decreased mobility, difficulty walking, decreased ROM, decreased strength, and pain.   ACTIVITY LIMITATIONS carrying, lifting, bending, sitting, standing, squatting, stairs, transfers, bed mobility, dressing, hygiene/grooming, locomotion level, and caring for others  PARTICIPATION LIMITATIONS: cleaning, shopping, community activity, occupation, and yard work  PERSONAL FACTORS  3+ comorbidities: HTN, ankylosing spondylitis, depression, and obesity  are also affecting patient's functional outcome.   REHAB POTENTIAL: Good  CLINICAL DECISION MAKING: Stable/uncomplicated  EVALUATION COMPLEXITY: Low  GOALS: Goals reviewed with patient? No  SHORT TERM GOALS: Target date: 07/15/2022  Patient will be independent with his initial HEP.  Baseline: Goal status: MET  2.  Patient will be able  to safely ambulate at least 80 feet without an assistive device.  Baseline:  Goal status: MET  3.  Patient will be able to complete his daily activities without his familiar left pain exceeding 4/10.  Baseline:  Goal status: MET  LONG TERM GOALS: Target date: 08/05/2022   Patient will be independent with his advanced HEP. Baseline:  Goal status: MET  2.  Patient will be able to safely ambulate without significant gait deviations.  Baseline:  Goal status: MET  3.  Patient will be able to complete his daily activities without his familiar left hip pain exceeding 2/10.  Baseline:  Goal status: MET  4.  Patient will be able to navigate at least 3 steps without assistance for improved household mobility.  Baseline: Goal status: MET  PLAN: PT FREQUENCY: 2x/week  PT DURATION: 6 weeks  PLANNED INTERVENTIONS: Therapeutic exercises, Therapeutic activity, Neuromuscular re-education, Balance training, Gait training, Patient/Family education, Self Care, Joint mobilization, Stair training, Electrical stimulation, Cryotherapy, Moist heat, Manual therapy, and Re-evaluation  PLAN FOR NEXT SESSION: nustep (as able, limited by right hip pain), lower extremity strengthening such as SLR, and modalities as needed   Darlin Coco, PT 07/25/2022, 1:48 PM

## 2022-07-27 ENCOUNTER — Other Ambulatory Visit: Payer: Self-pay | Admitting: Family

## 2022-07-27 DIAGNOSIS — Z79899 Other long term (current) drug therapy: Secondary | ICD-10-CM

## 2022-07-27 DIAGNOSIS — F5104 Psychophysiologic insomnia: Secondary | ICD-10-CM

## 2022-08-17 ENCOUNTER — Other Ambulatory Visit: Payer: Self-pay | Admitting: Family

## 2022-08-17 DIAGNOSIS — F320 Major depressive disorder, single episode, mild: Secondary | ICD-10-CM

## 2022-08-17 DIAGNOSIS — F411 Generalized anxiety disorder: Secondary | ICD-10-CM

## 2022-08-17 DIAGNOSIS — F5104 Psychophysiologic insomnia: Secondary | ICD-10-CM

## 2022-08-23 NOTE — Progress Notes (Signed)
Sent message, via epic in basket, requesting orders in epic from surgeon.  

## 2022-08-25 ENCOUNTER — Ambulatory Visit: Payer: Self-pay | Admitting: Physician Assistant

## 2022-08-25 DIAGNOSIS — M87051 Idiopathic aseptic necrosis of right femur: Secondary | ICD-10-CM

## 2022-08-25 NOTE — H&P (Signed)
TOTAL HIP ADMISSION H&P  Patient is admitted for right total hip arthroplasty.  Subjective:  Chief Complaint: right hip pain  HPI: Dean Ferguson, 36 y.o. male, has a history of pain and functional disability in the right hip(s) due to arthritis and patient has failed non-surgical conservative treatments for greater than 12 weeks to include NSAID's and/or analgesics and activity modification.  Onset of symptoms was gradual starting 2 years ago with gradually worsening course since that time.The patient noted no past surgery on the right hip(s).  Patient currently rates pain in the right hip at 8 out of 10 with activity. Patient has night pain, worsening of pain with activity and weight bearing, trendelenberg gait, pain that interfers with activities of daily living, and pain with passive range of motion. Patient has evidence of periarticular osteophytes, joint space narrowing, and femoral head collapse  by imaging studies. This condition presents safety issues increasing the risk of falls.   There is no current active infection.  Patient Active Problem List   Diagnosis Date Noted  . Ankylosing spondylitis ([place]) 02/24/2022  . GAD (generalized anxiety disorder) 02/03/2020  . Obesity (BMI 30-39.9) 02/03/2020  . Alcohol abuse 02/03/2020  . Elevated liver enzymes 02/03/2020  . Skin tag 10/15/2019  . Fatty liver 07/15/2019  . HLA B27 (HLA B27 positive) 12/28/2018  . Iritis of left eye 12/28/2018  . Chronic back pain 12/28/2018  . Severe obstructive sleep apnea-hypopnea syndrome 10/05/2018  . Essential hypertension 10/01/2018  . OSA (obstructive sleep apnea) 07/23/2018  . Psychophysiological insomnia 07/23/2018  . Depression, major, single episode, mild ([place]) 06/24/2018  . Gout 05/28/2018  . Snoring 05/24/2018   Past Medical History:  Diagnosis Date  . Anxiety   . Arthritis   . Fatty liver 07/15/2019  . GERD (gastroesophageal reflux disease)   . History of kidney stones   . Sleep apnea     cpap    Past Surgical History:  Procedure Laterality Date  . benign cyst removed from tongue     . LEG SURGERY Right    FELL THROUGH GLASS  . right foot surgery     . SHOULDER SURGERY Right    FELL THROUGH GLASS  . TOTAL HIP ARTHROPLASTY Left 06/22/2022   Procedure: TOTAL HIP ARTHROPLASTY;  Surgeon: Willaim Sheng, MD;  Location: [facility];  Service: Orthopedics;  Laterality: Left;    Current Outpatient Medications  Medication Sig Dispense Refill Last Dose  . ALPRAZolam (XANAX) 0.25 MG tablet Take 1 tablet (0.25 mg total) by mouth at bedtime as needed for anxiety. (Patient taking differently: Take 0.25 mg by mouth daily as needed (anxiety during a flight).) 6 tablet 0   . Calcium Carb-Cholecalciferol (CALCIUM + D3 PO) Take 1 tablet by mouth daily.     Marland Kitchen desvenlafaxine (PRISTIQ) 100 MG 24 hr tablet TAKE 1 TABLET BY MOUTH EVERY DAY 90 tablet 1   . losartan (COZAAR) 50 MG tablet Take 1 tablet (50 mg total) by mouth daily. 90 tablet 1   . rosuvastatin (CRESTOR) 5 MG tablet Take 1 tablet (5 mg total) by mouth daily. 90 tablet 3   . Vitamin D, Ergocalciferol, (DRISDOL) 1.25 MG (50000 UNIT) CAPS capsule TAKE 1 CAPSULE (50,000 UNITS TOTAL) BY MOUTH EVERY 7 (SEVEN) DAYS (Patient not taking: Reported on 06/08/2022) 12 capsule 3   . zolpidem (AMBIEN) 10 MG tablet TAKE 1 TABLET (10 MG TOTAL) BY MOUTH AT BEDTIME AS NEEDED. FOR SLEEP 90 tablet 1    No current facility-administered medications  for this visit.   Allergies  Allergen Reactions  . Other Rash    Metal  . Skelaxin [Metaxalone] Swelling and Rash    Social History   Tobacco Use  . Smoking status: Never  . Smokeless tobacco: Former    Types: Chew    Quit date: 09/26/2004  Substance Use Topics  . Alcohol use: Yes    Comment: 3-4 drinks daily    Family History  Problem Relation Age of Onset  . Immunodeficiency Mother   . Hyperlipidemia Father   . Hypertension Father   . Liver disease Paternal Grandfather        etoh  . Liver  disease Paternal Aunt   . Colon cancer Neg Hx      Review of Systems  Musculoskeletal:  Positive for arthralgias.  All other systems reviewed and are negative.  Objective:  Physical Exam Constitutional:      General: He is not in acute distress.    Appearance: Normal appearance.  HENT:     Head: Normocephalic and atraumatic.  Eyes:     Extraocular Movements: Extraocular movements intact.     Pupils: Pupils are equal, round, and reactive to light.  Cardiovascular:     Rate and Rhythm: Normal rate and regular rhythm.     Pulses: Normal pulses.     Heart sounds: Normal heart sounds.  Pulmonary:     Effort: Pulmonary effort is normal. No respiratory distress.     Breath sounds: Normal breath sounds. No wheezing.  Abdominal:     General: Abdomen is flat. Bowel sounds are normal. There is no distension.     Palpations: Abdomen is soft.     Tenderness: There is no abdominal tenderness.  Musculoskeletal:     Cervical back: Normal range of motion and neck supple.     Right hip: Tenderness and bony tenderness present. Decreased range of motion.  Lymphadenopathy:     Cervical: No cervical adenopathy.  Skin:    General: Skin is warm and dry.     Findings: No erythema or rash.  Neurological:     General: No focal deficit present.     Mental Status: He is alert and oriented to person, place, and time.  Psychiatric:        Mood and Affect: Mood normal.        Behavior: Behavior normal.   Vital signs in last 24 hours: _0 @  Labs:   Estimated body mass index is 30.24 kg/m as calculated from the following:   Height as of 06/22/22: 6' (1.829 m).   Weight as of 06/22/22: 101.2 kg.   Imaging Review Plain radiographs demonstrate severe degenerative joint disease of the right hip(s). The bone quality appears to be good for age and reported activity level.      Assessment/Plan:  End stage arthritis, right hip(s)  The patient history, physical examination, clinical  judgement of the provider and imaging studies are consistent with end stage degenerative joint disease of the right hip(s) and total hip arthroplasty is deemed medically necessary. The treatment options including medical management, injection therapy, arthroscopy and arthroplasty were discussed at length. The risks and benefits of total hip arthroplasty were presented and reviewed. The risks due to aseptic loosening, infection, stiffness, dislocation/subluxation,  thromboembolic complications and other imponderables were discussed.  The patient acknowledged the explanation, agreed to proceed with the plan and consent was signed. Patient is being admitted for inpatient treatment for surgery, pain control, PT, OT, prophylactic antibiotics, VTE prophylaxis,  progressive ambulation and ADL's and discharge planning.The patient is planning to be discharged  home with outpt PT.    Patient's anticipated LOS is less than 2 midnights, meeting these requirements: - Younger than 81 - Lives within 1 hour of care - Has a competent adult at home to recover with post-op recover - NO history of  - Chronic pain requiring opiods  - Diabetes  - Coronary Artery Disease  - Heart failure  - Heart attack  - Stroke  - DVT/VTE  - Cardiac arrhythmia  - Respiratory Failure/COPD  - Renal failure  - Anemia  - Advanced Liver disease

## 2022-08-25 NOTE — Patient Instructions (Addendum)
SURGICAL WAITING ROOM VISITATION Patients having surgery or a procedure may have no more than 2 support people in the waiting area - these visitors may rotate.   Children under the age of 38 must have an adult with them who is not the patient. If the patient needs to stay at the hospital during part of their recovery, the visitor guidelines for inpatient rooms apply. Pre-op nurse will coordinate an appropriate time for 1 support person to accompany patient in pre-op.  This support person may not rotate.    Please refer to the [facility] website for the visitor guidelines for Inpatients (after your surgery is over and you are in a regular room).      Your procedure is scheduled on: 09-12-22   [facility]    Report to admitting at 7:50 AM   Call this number if you have problems the morning of surgery [phone]   Do not eat food :After Midnight.   After Midnight you may have the following liquids until 7:20 AM DAY OF SURGERY  Water Non-Citrus Juices (without pulp, NO RED) Carbonated Beverages Black Coffee (NO MILK/CREAM OR CREAMERS, sugar ok)  Clear Tea (NO MILK/CREAM OR CREAMERS, sugar ok) regular and decaf                             Plain Jell-O (NO RED)                                           Fruit ices (not with fruit pulp, NO RED)                                     Popsicles (NO RED)                                                               Sports drinks like Gatorade (NO RED)                   The day of surgery:  Drink ONE (1) Pre-Surgery Clear Ensure at 7:20 AM the morning of surgery. Drink in one sitting. Do not sip.  This drink was given to you during your hospital  pre-op appointment visit. Nothing else to drink after completing the Pre-Surgery Clear Ensure.          If you have questions, please contact your surgeon's office.   FOLLOW  ANY ADDITIONAL PRE OP INSTRUCTIONS YOU RECEIVED FROM YOUR SURGEON'S OFFICE!!!      Oral Hygiene is also important to reduce your risk of infection.                                    Remember - BRUSH YOUR TEETH THE MORNING OF SURGERY WITH YOUR REGULAR TOOTHPASTE   Do NOT smoke after Midnight   Take these medicines the morning of surgery with A SIP OF WATER:   Desvenlafaxine  Rosuvastatin  Bring CPAP mask and tubing day  of surgery.                              You may not have any metal on your body including jewelry, and body piercing             Do not wear lotions, powders, cologne, or deodorant              Men may shave face and neck.   Do not bring valuables to the hospital. [facility] IS NOT RESPONSIBLE   FOR VALUABLES.   Contacts, dentures or bridgework may not be worn into surgery.  DO NOT BRING YOUR HOME MEDICATIONS TO THE HOSPITAL. PHARMACY WILL DISPENSE MEDICATIONS LISTED ON YOUR MEDICATION LIST TO YOU DURING YOUR ADMISSION IN THE HOSPITAL!    Patients discharged on the day of surgery will not be allowed to drive home.  Someone NEEDS to stay with you for the first 24 hours after anesthesia.              Please read over the following fact sheets you were given: IF YOU HAVE QUESTIONS ABOUT YOUR PRE-OP INSTRUCTIONS PLEASE CALL [phone] G[place]  If you received a COVID test during your pre-op visit  it is requested that you wear a mask when out in public, stay away from anyone that may not be feeling well and notify your surgeon if you develop symptoms. If you test positive for Covid or have been in contact with anyone that has tested positive in the last 10 days please notify you surgeon.  [facility] - Preparing for Surgery Before surgery, you can play an important role.  Because skin is not sterile, your skin needs to be as free of germs as possible.  You can reduce the number of germs on your skin by washing with CHG (chlorahexidine gluconate) soap before surgery.  CHG is an antiseptic cleaner which kills germs and bonds with the skin to continue  killing germs even after washing. Please DO NOT use if you have an allergy to CHG or antibacterial soaps.  If your skin becomes reddened/irritated stop using the CHG and inform your nurse when you arrive at Short Stay. Do not shave (including legs and underarms) for at least 48 hours prior to the first CHG shower.  You may shave your face/neck.  Please follow these instructions carefully:  1.  Shower with CHG Soap the night before surgery and the  morning of surgery.  2.  If you choose to wash your hair, wash your hair first as usual with your normal  shampoo.  3.  After you shampoo, rinse your hair and body thoroughly to remove the shampoo.                             4.  Use CHG as you would any other liquid soap.  You can apply chg directly to the skin and wash.  Gently with a scrungie or clean washcloth.  5.  Apply the CHG Soap to your body ONLY FROM THE NECK DOWN.   Do   not use on face/ open                           Wound or open sores. Avoid contact with eyes, ears mouth and   genitals (private parts).  Wash face,  Genitals (private parts) with your normal soap.             6.  Wash thoroughly, paying special attention to the area where your    surgery  will be performed.  7.  Thoroughly rinse your body with warm water from the neck down.  8.  DO NOT shower/wash with your normal soap after using and rinsing off the CHG Soap.                9.  Pat yourself dry with a clean towel.            10.  Wear clean pajamas.            11.  Place clean sheets on your bed the night of your first shower and do not  sleep with pets. Day of Surgery : Do not apply any lotions/deodorants the morning of surgery.  Please wear clean clothes to the hospital/surgery center.  FAILURE TO FOLLOW THESE INSTRUCTIONS MAY RESULT IN THE CANCELLATION OF YOUR SURGERY  PATIENT SIGNATURE_________________________________  NURSE  SIGNATURE__________________________________  ________________________________________________________________________    Adam Phenix  An incentive spirometer is a tool that can help keep your lungs clear and active. This tool measures how well you are filling your lungs with each breath. Taking long deep breaths may help reverse or decrease the chance of developing breathing (pulmonary) problems (especially infection) following: A long period of time when you are unable to move or be active. BEFORE THE PROCEDURE  If the spirometer includes an indicator to show your best effort, your nurse or respiratory therapist will set it to a desired goal. If possible, sit up straight or lean slightly forward. Try not to slouch. Hold the incentive spirometer in an upright position. INSTRUCTIONS FOR USE  Sit on the edge of your bed if possible, or sit up as far as you can in bed or on a chair. Hold the incentive spirometer in an upright position. Breathe out normally. Place the mouthpiece in your mouth and seal your lips tightly around it. Breathe in slowly and as deeply as possible, raising the piston or the ball toward the top of the column. Hold your breath for 3-5 seconds or for as long as possible. Allow the piston or ball to fall to the bottom of the column. Remove the mouthpiece from your mouth and breathe out normally. Rest for a few seconds and repeat Steps 1 through 7 at least 10 times every 1-2 hours when you are awake. Take your time and take a few normal breaths between deep breaths. The spirometer may include an indicator to show your best effort. Use the indicator as a goal to work toward during each repetition. After each set of 10 deep breaths, practice coughing to be sure your lungs are clear. If you have an incision (the cut made at the time of surgery), support your incision when coughing by placing a pillow or rolled up towels firmly against it. Once you are able to get out of  bed, walk around indoors and cough well. You may stop using the incentive spirometer when instructed by your caregiver.  RISKS AND COMPLICATIONS Take your time so you do not get dizzy or light-headed. If you are in pain, you may need to take or ask for pain medication before doing incentive spirometry. It is harder to take a deep breath if you are having pain. AFTER USE Rest and breathe slowly and easily. It can be  helpful to keep track of a log of your progress. Your caregiver can provide you with a simple table to help with this. If you are using the spirometer at home, follow these instructions: SEEK MEDICAL CARE IF:  You are having difficultly using the spirometer. You have trouble using the spirometer as often as instructed. Your pain medication is not giving enough relief while using the spirometer. You develop fever of 100.5 F (38.1 C) or higher. SEEK IMMEDIATE MEDICAL CARE IF:  You cough up bloody sputum that had not been present before. You develop fever of 102 F (38.9 C) or greater. You develop worsening pain at or near the incision site. MAKE SURE YOU:  Understand these instructions. Will watch your condition. Will get help right away if you are not doing well or get worse. Document Released: 01/23/2007 Document Revised: 12/05/2011 Document Reviewed: 03/26/2007 Exit[employer] Patient Information 2014 Exit[employer], [place].   ________________________________________________________________________   WHAT IS A BLOOD TRANSFUSION? Blood Transfusion Information  A transfusion is the replacement of blood or some of its parts. Blood is made up of multiple cells which provide different functions. Red blood cells carry oxygen and are used for blood loss replacement. White blood cells fight against infection. Platelets control bleeding. Plasma helps clot blood. Other blood products are available for specialized needs, such as hemophilia or other clotting disorders. BEFORE THE TRANSFUSION   Who gives blood for transfusions?  Healthy volunteers who are fully evaluated to make sure their blood is safe. This is blood bank blood. Transfusion therapy is the safest it has ever been in the practice of medicine. Before blood is taken from a donor, a complete history is taken to make sure that person has no history of diseases nor engages in risky social behavior (examples are intravenous drug use or sexual activity with multiple partners). The donor's travel history is screened to minimize risk of transmitting infections, such as malaria. The donated blood is tested for signs of infectious diseases, such as HIV and hepatitis. The blood is then tested to be sure it is compatible with you in order to minimize the chance of a transfusion reaction. If you or a relative donates blood, this is often done in anticipation of surgery and is not appropriate for emergency situations. It takes many days to process the donated blood. RISKS AND COMPLICATIONS Although transfusion therapy is very safe and saves many lives, the main dangers of transfusion include:  Getting an infectious disease. Developing a transfusion reaction. This is an allergic reaction to something in the blood you were given. Every precaution is taken to prevent this. The decision to have a blood transfusion has been considered carefully by your caregiver before blood is given. Blood is not given unless the benefits outweigh the risks. AFTER THE TRANSFUSION Right after receiving a blood transfusion, you will usually feel much better and more energetic. This is especially true if your red blood cells have gotten low (anemic). The transfusion raises the level of the red blood cells which carry oxygen, and this usually causes an energy increase. The nurse administering the transfusion will monitor you carefully for complications. HOME CARE INSTRUCTIONS  No special instructions are needed after a transfusion. You may find your energy is  better. Speak with your caregiver about any limitations on activity for underlying diseases you may have. SEEK MEDICAL CARE IF:  Your condition is not improving after your transfusion. You develop redness or irritation at the intravenous (IV) site. SEEK IMMEDIATE MEDICAL CARE IF:  Any of the following symptoms occur over the next 12 hours: Shaking chills. You have a temperature by mouth above 102 F (38.9 C), not controlled by medicine. Chest, back, or muscle pain. People around you feel you are not acting correctly or are confused. Shortness of breath or difficulty breathing. Dizziness and fainting. You get a rash or develop hives. You have a decrease in urine output. Your urine turns a dark color or changes to pink, red, or brown. Any of the following symptoms occur over the next 10 days: You have a temperature by mouth above 102 F (38.9 C), not controlled by medicine. Shortness of breath. Weakness after normal activity. The white part of the eye turns yellow (jaundice). You have a decrease in the amount of urine or are urinating less often. Your urine turns a dark color or changes to pink, red, or brown. Document Released: 09/09/2000 Document Revised: 12/05/2011 Document Reviewed: 04/28/2008 [facility] Patient Information 2014 [place], [place].  _______________________________________________________________________

## 2022-08-25 NOTE — Progress Notes (Addendum)
COVID Vaccine Completed:  Yes  Date of COVID positive in last 90 days:  No  PCP - Jannifer Rodney, FNP Cardiologist - N/A  Chest x-ray - 05-13-22 Epic EKG - 05-13-22 Epic Stress Test - N/A ECHO - N/A Cardiac Cath - N/A Pacemaker/ICD device last checked: Spinal Cord Stimulator:N/A  Bowel Prep - N/A  Sleep Study - Yes, +sleep apnea CPAP - Yes   Fasting Blood Sugar - N/A Checks Blood Sugar _____ times a day  Last dose of GLP1 agonist-  N/A GLP1 instructions:  N/A   Last dose of SGLT-2 inhibitors-  N/A SGLT-2 instructions: N/A   Blood Thinner Instructions:N/A Aspirin Instructions: Last Dose:  Activity level:  Can go up a flight of stairs and perform activities of daily living without stopping and without symptoms of chest pain or shortness of breath.  Anesthesia review: N/A  Patient denies shortness of breath, fever, cough and chest pain at PAT appointment  Patient verbalized understanding of instructions that were given to them at the PAT appointment. Patient was also instructed that they will need to review over the PAT instructions again at home before surgery.

## 2022-08-25 NOTE — H&P (View-Only) (Signed)
TOTAL HIP ADMISSION H&P  Patient is admitted for right total hip arthroplasty.  Subjective:  Chief Complaint: right hip pain  HPI: Dean Ferguson, 36 y.o. male, has a history of pain and functional disability in the right hip(s) due to arthritis and patient has failed non-surgical conservative treatments for greater than 12 weeks to include NSAID's and/or analgesics and activity modification.  Onset of symptoms was gradual starting 2 years ago with gradually worsening course since that time.The patient noted no past surgery on the right hip(s).  Patient currently rates pain in the right hip at 8 out of 10 with activity. Patient has night pain, worsening of pain with activity and weight bearing, trendelenberg gait, pain that interfers with activities of daily living, and pain with passive range of motion. Patient has evidence of periarticular osteophytes, joint space narrowing, and femoral head collapse  by imaging studies. This condition presents safety issues increasing the risk of falls.   There is no current active infection.  Patient Active Problem List   Diagnosis Date Noted  . Ankylosing spondylitis ([place]) 02/24/2022  . GAD (generalized anxiety disorder) 02/03/2020  . Obesity (BMI 30-39.9) 02/03/2020  . Alcohol abuse 02/03/2020  . Elevated liver enzymes 02/03/2020  . Skin tag 10/15/2019  . Fatty liver 07/15/2019  . HLA B27 (HLA B27 positive) 12/28/2018  . Iritis of left eye 12/28/2018  . Chronic back pain 12/28/2018  . Severe obstructive sleep apnea-hypopnea syndrome 10/05/2018  . Essential hypertension 10/01/2018  . OSA (obstructive sleep apnea) 07/23/2018  . Psychophysiological insomnia 07/23/2018  . Depression, major, single episode, mild ([place]) 06/24/2018  . Gout 05/28/2018  . Snoring 05/24/2018   Past Medical History:  Diagnosis Date  . Anxiety   . Arthritis   . Fatty liver 07/15/2019  . GERD (gastroesophageal reflux disease)   . History of kidney stones   . Sleep apnea     cpap    Past Surgical History:  Procedure Laterality Date  . benign cyst removed from tongue     . LEG SURGERY Right    FELL THROUGH GLASS  . right foot surgery     . SHOULDER SURGERY Right    FELL THROUGH GLASS  . TOTAL HIP ARTHROPLASTY Left 06/22/2022   Procedure: TOTAL HIP ARTHROPLASTY;  Surgeon: Willaim Sheng, MD;  Location: [facility] ORS;  Service: Orthopedics;  Laterality: Left;    Current Outpatient Medications  Medication Sig Dispense Refill Last Dose  . ALPRAZolam (XANAX) 0.25 MG tablet Take 1 tablet (0.25 mg total) by mouth at bedtime as needed for anxiety. (Patient taking differently: Take 0.25 mg by mouth daily as needed (anxiety during a flight).) 6 tablet 0   . Calcium Carb-Cholecalciferol (CALCIUM + D3 PO) Take 1 tablet by mouth daily.     Marland Kitchen desvenlafaxine (PRISTIQ) 100 MG 24 hr tablet TAKE 1 TABLET BY MOUTH EVERY DAY 90 tablet 1   . losartan (COZAAR) 50 MG tablet Take 1 tablet (50 mg total) by mouth daily. 90 tablet 1   . rosuvastatin (CRESTOR) 5 MG tablet Take 1 tablet (5 mg total) by mouth daily. 90 tablet 3   . Vitamin D, Ergocalciferol, (DRISDOL) 1.25 MG (50000 UNIT) CAPS capsule TAKE 1 CAPSULE (50,000 UNITS TOTAL) BY MOUTH EVERY 7 (SEVEN) DAYS (Patient not taking: Reported on 06/08/2022) 12 capsule 3   . zolpidem (AMBIEN) 10 MG tablet TAKE 1 TABLET (10 MG TOTAL) BY MOUTH AT BEDTIME AS NEEDED. FOR SLEEP 90 tablet 1    No current facility-administered medications  for this visit.   Allergies  Allergen Reactions  . Other Rash    Metal  . Skelaxin [Metaxalone] Swelling and Rash    Social History   Tobacco Use  . Smoking status: Never  . Smokeless tobacco: Former    Types: Chew    Quit date: 09/26/2004  Substance Use Topics  . Alcohol use: Yes    Comment: 3-4 drinks daily    Family History  Problem Relation Age of Onset  . Immunodeficiency Mother   . Hyperlipidemia Father   . Hypertension Father   . Liver disease Paternal Grandfather        etoh  . Liver  disease Paternal Aunt   . Colon cancer Neg Hx      Review of Systems  Musculoskeletal:  Positive for arthralgias.  All other systems reviewed and are negative.  Objective:  Physical Exam Constitutional:      General: He is not in acute distress.    Appearance: Normal appearance.  HENT:     Head: Normocephalic and atraumatic.  Eyes:     Extraocular Movements: Extraocular movements intact.     Pupils: Pupils are equal, round, and reactive to light.  Cardiovascular:     Rate and Rhythm: Normal rate and regular rhythm.     Pulses: Normal pulses.     Heart sounds: Normal heart sounds.  Pulmonary:     Effort: Pulmonary effort is normal. No respiratory distress.     Breath sounds: Normal breath sounds. No wheezing.  Abdominal:     General: Abdomen is flat. Bowel sounds are normal. There is no distension.     Palpations: Abdomen is soft.     Tenderness: There is no abdominal tenderness.  Musculoskeletal:     Cervical back: Normal range of motion and neck supple.     Right hip: Tenderness and bony tenderness present. Decreased range of motion.  Lymphadenopathy:     Cervical: No cervical adenopathy.  Skin:    General: Skin is warm and dry.     Findings: No erythema or rash.  Neurological:     General: No focal deficit present.     Mental Status: He is alert and oriented to person, place, and time.  Psychiatric:        Mood and Affect: Mood normal.        Behavior: Behavior normal.   Vital signs in last 24 hours: _0 @  Labs:   Estimated body mass index is 30.24 kg/m as calculated from the following:   Height as of 06/22/22: 6' (1.829 m).   Weight as of 06/22/22: 101.2 kg.   Imaging Review Plain radiographs demonstrate severe degenerative joint disease of the right hip(s). The bone quality appears to be good for age and reported activity level.      Assessment/Plan:  End stage arthritis, right hip(s)  The patient history, physical examination, clinical  judgement of the provider and imaging studies are consistent with end stage degenerative joint disease of the right hip(s) and total hip arthroplasty is deemed medically necessary. The treatment options including medical management, injection therapy, arthroscopy and arthroplasty were discussed at length. The risks and benefits of total hip arthroplasty were presented and reviewed. The risks due to aseptic loosening, infection, stiffness, dislocation/subluxation,  thromboembolic complications and other imponderables were discussed.  The patient acknowledged the explanation, agreed to proceed with the plan and consent was signed. Patient is being admitted for inpatient treatment for surgery, pain control, PT, OT, prophylactic antibiotics, VTE prophylaxis,  progressive ambulation and ADL's and discharge planning.The patient is planning to be discharged  home with outpt PT.    Patient's anticipated LOS is less than 2 midnights, meeting these requirements: - Younger than 4 - Lives within 1 hour of care - Has a competent adult at home to recover with post-op recover - NO history of  - Chronic pain requiring opiods  - Diabetes  - Coronary Artery Disease  - Heart failure  - Heart attack  - Stroke  - DVT/VTE  - Cardiac arrhythmia  - Respiratory Failure/COPD  - Renal failure  - Anemia  - Advanced Liver disease

## 2022-08-26 ENCOUNTER — Encounter: Payer: Self-pay | Admitting: Family

## 2022-08-26 ENCOUNTER — Ambulatory Visit (INDEPENDENT_AMBULATORY_CARE_PROVIDER_SITE_OTHER): Payer: 59 | Admitting: Family

## 2022-08-26 VITALS — BP 126/83 | HR 82 | Temp 97.6°F | Ht 72.0 in | Wt 225.0 lb

## 2022-08-26 DIAGNOSIS — G4733 Obstructive sleep apnea (adult) (pediatric): Secondary | ICD-10-CM

## 2022-08-26 DIAGNOSIS — F5104 Psychophysiologic insomnia: Secondary | ICD-10-CM | POA: Diagnosis not present

## 2022-08-26 DIAGNOSIS — F320 Major depressive disorder, single episode, mild: Secondary | ICD-10-CM | POA: Diagnosis not present

## 2022-08-26 DIAGNOSIS — F411 Generalized anxiety disorder: Secondary | ICD-10-CM | POA: Diagnosis not present

## 2022-08-26 DIAGNOSIS — Z79899 Other long term (current) drug therapy: Secondary | ICD-10-CM

## 2022-08-26 DIAGNOSIS — I10 Essential (primary) hypertension: Secondary | ICD-10-CM

## 2022-08-26 DIAGNOSIS — E669 Obesity, unspecified: Secondary | ICD-10-CM

## 2022-08-26 DIAGNOSIS — K760 Fatty (change of) liver, not elsewhere classified: Secondary | ICD-10-CM

## 2022-08-26 DIAGNOSIS — M25551 Pain in right hip: Secondary | ICD-10-CM

## 2022-08-26 DIAGNOSIS — E785 Hyperlipidemia, unspecified: Secondary | ICD-10-CM

## 2022-08-26 DIAGNOSIS — F1010 Alcohol abuse, uncomplicated: Secondary | ICD-10-CM

## 2022-08-26 DIAGNOSIS — Z96642 Presence of left artificial hip joint: Secondary | ICD-10-CM

## 2022-08-26 MED ORDER — ZOLPIDEM TARTRATE 10 MG PO TABS: 10.0000 mg | ORAL_TABLET | Freq: Every evening | ORAL | 1 refills | Status: AC | PRN

## 2022-08-26 MED ORDER — DESVENLAFAXINE SUCCINATE ER 100 MG PO TB24: 100.0000 mg | ORAL_TABLET | Freq: Every day | ORAL | 1 refills | Status: DC

## 2022-08-26 MED ORDER — LOSARTAN POTASSIUM 50 MG PO TABS: 50.0000 mg | ORAL_TABLET | Freq: Every day | ORAL | 1 refills | Status: DC

## 2022-08-26 NOTE — Progress Notes (Signed)
Subjective:    Patient ID: Dean Ferguson, male    DOB: May 30, 1986, 36 y.o.   MRN: [id number]  Chief Complaint  Patient presents with   Medical Management of Chronic Issues   Pt presents to the office today chronic follow up.Marland Kitchen He is currently taking Pristiq 100 mg daily. He reports he has increased anxiety about flying. He takes xanax as needed when flying.    He reports he drinks 3-4 shots once a week. He has decreased this from daily.    He is followed by Rheumatologists for ankylosing spondylitis of multiple sites. Starting on Humira. And followed by Ortho as needed for back pain and hip pain. He had a left total hip replacement on 06/22/22 and scheduled for right total hip replacement 09/12/22.   Has OSA and uses CPAP some nights.  Hypertension This is a chronic problem. The current episode started more than 1 year ago. The problem has been resolved since onset. The problem is controlled. Associated symptoms include anxiety and malaise/fatigue. Pertinent negatives include no peripheral edema or shortness of breath. Risk factors for coronary artery disease include dyslipidemia, obesity and male gender. The current treatment provides moderate improvement.  Anxiety Presents for follow-up visit. Symptoms include depressed mood, irritability and nervous/anxious behavior. Patient reports no shortness of breath. Symptoms occur occasionally. The severity of symptoms is moderate.    Depression        This is a chronic problem.  The current episode started more than 1 year ago.   The onset quality is gradual.   The problem occurs intermittently.  Associated symptoms include fatigue.  Associated symptoms include no helplessness, no hopelessness and not sad.  Past treatments include SNRIs - Serotonin and norepinephrine reuptake inhibitors.  Past medical history includes anxiety.       Review of Systems  Constitutional:  Positive for fatigue, irritability and malaise/fatigue.  Respiratory:   Negative for shortness of breath.   Psychiatric/Behavioral:  Positive for depression. The patient is nervous/anxious.   All other systems reviewed and are negative.      Objective:   Physical Exam Vitals reviewed.  Constitutional:      General: He is not in acute distress.    Appearance: He is well-developed. He is obese.  HENT:     Head: Normocephalic.     Right Ear: Tympanic membrane normal.     Left Ear: Tympanic membrane normal.  Eyes:     General:        Right eye: No discharge.        Left eye: No discharge.     Pupils: Pupils are equal, round, and reactive to light.  Neck:     Thyroid: No thyromegaly.  Cardiovascular:     Rate and Rhythm: Normal rate and regular rhythm.     Heart sounds: Normal heart sounds. No murmur heard. Pulmonary:     Effort: Pulmonary effort is normal. No respiratory distress.     Breath sounds: Normal breath sounds. No wheezing.  Abdominal:     General: Bowel sounds are normal. There is no distension.     Palpations: Abdomen is soft.     Tenderness: There is no abdominal tenderness.  Musculoskeletal:        General: No tenderness.     Cervical back: Normal range of motion and neck supple.     Comments: Full ROM of left hip, pain in right hip external and internal rotation  Skin:    General: Skin is warm and  dry.     Findings: No erythema or rash.  Neurological:     Mental Status: He is alert and oriented to person, place, and time.     Cranial Nerves: No cranial nerve deficit.     Deep Tendon Reflexes: Reflexes are normal and symmetric.  Psychiatric:        Behavior: Behavior normal.        Thought Content: Thought content normal.        Judgment: Judgment normal.        BP 126/83   Pulse 82   Temp 97.6 F (36.4 C) (Temporal)   Ht 6' (1.829 m)   Wt 225 lb (102.1 kg)   BMI 30.52 kg/m   Assessment & Plan:  Akai Weatherby comes in today with chief complaint of Medical Management of Chronic Issues   Diagnosis and orders  addressed:  1. GAD (generalized anxiety disorder) - desvenlafaxine (PRISTIQ) 100 MG 24 hr tablet; Take 1 tablet (100 mg total) by mouth daily.  Dispense: 90 tablet; Refill: 1  2. Psychophysiological insomnia - desvenlafaxine (PRISTIQ) 100 MG 24 hr tablet; Take 1 tablet (100 mg total) by mouth daily.  Dispense: 90 tablet; Refill: 1 - zolpidem (AMBIEN) 10 MG tablet; Take 1 tablet (10 mg total) by mouth at bedtime as needed. for sleep  Dispense: 90 tablet; Refill: 1  3. Depression, major, single episode, mild (HCC) - desvenlafaxine (PRISTIQ) 100 MG 24 hr tablet; Take 1 tablet (100 mg total) by mouth daily.  Dispense: 90 tablet; Refill: 1  4. Primary hypertension - losartan (COZAAR) 50 MG tablet; Take 1 tablet (50 mg total) by mouth daily.  Dispense: 90 tablet; Refill: 1  5. Hyperlipidemia, unspecified hyperlipidemia type  6. Controlled substance agreement signed - zolpidem (AMBIEN) 10 MG tablet; Take 1 tablet (10 mg total) by mouth at bedtime as needed. for sleep  Dispense: 90 tablet; Refill: 1  7. OSA (obstructive sleep apnea)  8. Obesity (BMI 30-39.9)  9. Essential hypertension  10. Fatty liver  11. Alcohol abuse  12. History of left hip replacement  13. Right hip pain Keep follow up with Ortho   Labs pending Patient reviewed in [place] controlled database, no flags noted. Contract and drug screen are up to date.  Health Maintenance reviewed Diet and exercise encouraged  Follow up plan: 6 months   Evelina Dun, FNP

## 2022-08-26 NOTE — Patient Instructions (Signed)
Total Hip Replacement, Posterior, Care After This sheet gives you information about how to care for yourself after your procedure. Your health care provider may also give you more specific instructions. If you have problems or questions, contact your health care provider. What can I expect after the procedure? After the procedure, it is common to have: Redness, pain, and swelling in your incision area. Stiffness. Discomfort. A small amount of blood or clear fluid coming from your incision. Follow these instructions at home: Medicines Take over-the-counter and prescription medicines only as told by your health care provider. If you were prescribed a blood thinner (anticoagulant) to help prevent blood clots, take it as told by your health care provider. Ask your health care provider if the medicine prescribed to you: Requires you to avoid driving or using machinery. Can cause constipation. You may need to take these actions to prevent or treat constipation: Drink enough fluid to keep your urine pale yellow. Take over-the-counter or prescription medicines. Eat foods that are high in fiber, such as beans, whole grains, and fresh fruits and vegetables. Limit foods that are high in fat and processed sugars, such as fried or sweet foods. Incision care  Follow instructions from your health care provider about caring for your incision. Make sure you: Wash your hands with soap and water for at least 20 seconds before and after you change your bandage (dressing). If soap and water are not available, use hand sanitizer. Change your dressing as told by your health care provider. Leave stitches (sutures), staples, skin glue, or adhesive strips in place. These skin closures may need to stay in place for 2 weeks or longer. If adhesive strip edges start to loosen and curl up, you may trim the loose edges. Do not remove adhesive strips completely unless your health care provider tells you to do that. Do not  take baths, swim, or use a hot tub until your health care provider approves. Check your incision area every day for signs of infection. Check for: More redness, swelling, or pain. More fluid or blood. Warmth. Pus or a bad smell. Managing pain, stiffness, and swelling  If directed, put ice on the affected area. To do this: Put ice in a plastic bag. Place a towel between your skin and the bag. Leave the ice on for 20 minutes, 2-3 times a day. Remove the ice if your skin turns bright red. This is very important. If you cannot feel pain, heat, or cold, you have a greater risk of damage to the area. Move your toes often to reduce stiffness and swelling. Raise (elevate) your leg above the level of your heart while you are lying down. Activity Ask your health care provider what activities are safe for you. Avoid sitting for a long time without moving. Get up to take short walks every 1-2 hours. This is important to improve blood flow and breathing. Ask for help if you feel weak or unsteady. Do exercises as told by your health care provider. Do not use your legs to support (bear) your body weight until your health care provider says that you can. Follow instructions about how much weight you may safely support on your affected leg (weight-bearing restrictions). Use a walker, crutches, or a cane as told by your health care provider. Return to your normal activities as told by your health care provider. Movement restrictions  To help prevent hip dislocation, follow instructions about movement restrictions as told. For example: Do not cross your legs at the  knees. To remind yourself about this, you may keep a pillow between your legs while lying in bed. Do not bend at the hip and waist or bend farther than 90 degrees of hip flexion. To avoid bending this far: Do not bring your knees higher than your hips. Do not pick up something from the floor while sitting in a chair. Avoid sitting in low  chairs. Use a raised toilet seat. When standing up from a seated position, keep the injured leg out in front of you. Avoid twisting at your waist and reaching across your body to the side of the affected leg. Avoid rotating your toes inward. When getting into a car: Raise the seat as high as possible, move the seat as far back as it will go, and recline the upper part of the seat slightly. Sit down into the seat with your injured leg extended out of the car. Scoot back into the seat as you move the lower half of your body into the car. Try to avoid bumping your foot or leg as you bring it into the car. Safety To help prevent falls, keep floors clear of objects you may trip over, and place items that you may need within easy reach. Wear an apron or tool belt with pockets for carrying objects. This leaves your hands free to help with your balance. General instructions Ask your health care provider when it is safe to drive. Wear compression stockings as told by your health care provider. These stockings help to prevent blood clots and reduce swelling in your legs. Keep doing breathing exercises as told. This helps prevent lung infection. Do not use any products that contain nicotine or tobacco, such as cigarettes, e-cigarettes, and chewing tobacco. These can delay healing after surgery. If you need help quitting, ask your health care provider. Tell your health care provider if you plan to have dental work. Also: Tell your dentist about your joint replacement. Ask your health care provider if there are any special instructions you need to follow before having dental care and routine cleanings. Keep all follow-up visits. This is important. Contact a health care provider if: You have a fever or chills. You have a cough. Your medicine is not controlling your pain. You have more redness, swelling, or pain around your incision. You have more fluid or blood coming from your incision. Your incision  feels warm to the touch. You have pus or a bad smell coming from your incision. Get help right away if: You have severe pain. You have shortness of breath or trouble breathing. You have chest pain. You have redness, swelling, pain, or warmth in your calf or leg. Your incision breaks open after sutures or staples are removed. These symptoms may represent a serious problem that is an emergency. Do not wait to see if the symptoms will go away. Get medical help right away. Call your local emergency services (911 in the [place] Do not drive yourself to the hospital. Summary Do not use your legs to support your body weight until your health care provider says that you can. Use a walker, crutches, or a cane as told. To help prevent hip dislocation, follow instructions about movement restrictions as told. Keep all follow-up visits. This is important. This information is not intended to replace advice given to you by your health care provider. Make sure you discuss any questions you have with your health care provider. Document Revised: 02/06/2020 Document Reviewed: 02/06/2020 Elsevier Patient Education  2023 [employer].

## 2022-08-30 ENCOUNTER — Encounter (HOSPITAL_COMMUNITY)
Admission: RE | Admit: 2022-08-30 | Discharge: 2022-08-30 | Disposition: A | Discharge status: Home / Self Care | Payer: 59 | Source: Ambulatory Visit | Attending: Orthopedic Surgery | Admitting: Orthopedic Surgery

## 2022-08-30 ENCOUNTER — Encounter (HOSPITAL_COMMUNITY): Payer: Self-pay

## 2022-08-30 ENCOUNTER — Other Ambulatory Visit: Payer: Self-pay

## 2022-08-30 VITALS — BP 138/99 | HR 70 | Temp 97.9°F | Resp 12 | Ht 72.0 in | Wt 225.6 lb

## 2022-08-30 DIAGNOSIS — Z01812 Encounter for preprocedural laboratory examination: Secondary | ICD-10-CM | POA: Diagnosis present

## 2022-08-30 DIAGNOSIS — M87051 Idiopathic aseptic necrosis of right femur: Secondary | ICD-10-CM | POA: Insufficient documentation

## 2022-08-30 DIAGNOSIS — K769 Liver disease, unspecified: Secondary | ICD-10-CM

## 2022-08-30 DIAGNOSIS — Z01818 Encounter for other preprocedural examination: Secondary | ICD-10-CM

## 2022-08-30 HISTORY — DX: Personal history of other diseases of the musculoskeletal system and connective tissue: Z87.39

## 2022-08-30 LAB — CBC WITH DIFFERENTIAL/PLATELET
Abs Immature Granulocytes: 0.02 10*3/uL (ref 0.00–0.07)
Basophils Absolute: 0.1 10*3/uL (ref 0.0–0.1)
Basophils Relative: 1 %
Eosinophils Absolute: 0 10*3/uL (ref 0.0–0.5)
Eosinophils Relative: 1 %
HCT: 45.5 % (ref 39.0–52.0)
Hemoglobin: 15.7 g/dL (ref 13.0–17.0)
Immature Granulocytes: 0 %
Lymphocytes Relative: 26 %
Lymphs Abs: 1.7 10*3/uL (ref 0.7–4.0)
MCH: 33.4 pg (ref 26.0–34.0)
MCHC: 34.5 g/dL (ref 30.0–36.0)
MCV: 96.8 fL (ref 80.0–100.0)
Monocytes Absolute: 0.7 10*3/uL (ref 0.1–1.0)
Monocytes Relative: 10 %
Neutro Abs: 4 10*3/uL (ref 1.7–7.7)
Neutrophils Relative %: 62 %
Platelets: 195 10*3/uL (ref 150–400)
RBC: 4.7 MIL/uL (ref 4.22–5.81)
RDW: 12.9 % (ref 11.5–15.5)
WBC: 6.5 10*3/uL (ref 4.0–10.5)
nRBC: 0 % (ref 0.0–0.2)

## 2022-08-30 LAB — TYPE AND SCREEN
ABO/RH(D): O POS
Antibody Screen: NEGATIVE

## 2022-08-30 LAB — COMPREHENSIVE METABOLIC PANEL
ALT: 35 U/L (ref 0–44)
AST: 31 U/L (ref 15–41)
Albumin: 4.4 g/dL (ref 3.5–5.0)
Alkaline Phosphatase: 68 U/L (ref 38–126)
Anion gap: 10 (ref 5–15)
BUN: 7 mg/dL (ref 6–20)
CO2: 27 mmol/L (ref 22–32)
Calcium: 9.4 mg/dL (ref 8.9–10.3)
Chloride: 102 mmol/L (ref 98–111)
Creatinine, Ser: 0.82 mg/dL (ref 0.61–1.24)
GFR, Estimated: >60 mL/min (ref >60)
Glucose, Bld: 98 mg/dL (ref 70–99)
Potassium: 3.7 mmol/L (ref 3.5–5.1)
Sodium: 139 mmol/L (ref 135–145)
Total Bilirubin: 1.2 mg/dL (ref 0.3–1.2)
Total Protein: 7.5 g/dL (ref 6.5–8.1)

## 2022-08-30 LAB — SURGICAL PCR SCREEN
MRSA, PCR: NEGATIVE
Staphylococcus aureus: NEGATIVE

## 2022-09-11 NOTE — Anesthesia Preprocedure Evaluation (Signed)
Anesthesia Evaluation  Patient identified by MRN, date of birth, ID band Patient awake    Reviewed: Allergy & Precautions, NPO status , Patient's Chart, lab work & pertinent test results  Airway Mallampati: II  TM Distance: >3 FB Neck ROM: Full    Dental no notable dental hx. (+) Teeth Intact, Dental Advisory Given   Pulmonary sleep apnea (non compliant with CPAP)    Pulmonary exam normal breath sounds clear to auscultation       Cardiovascular hypertension, Pt. on medications Normal cardiovascular exam Rhythm:Regular Rate:Normal     Neuro/Psych  PSYCHIATRIC DISORDERS Anxiety Depression    negative neurological ROS     GI/Hepatic ,GERD  ,,(+)     substance abuse  alcohol use  Endo/Other  negative endocrine ROS    Renal/GU negative Renal ROS  negative genitourinary   Musculoskeletal  (+) Arthritis ,  Ankylosing spondylitis   Abdominal   Peds  Hematology negative hematology ROS (+)   Anesthesia Other Findings   Reproductive/Obstetrics                             Anesthesia Physical Anesthesia Plan  ASA: 3  Anesthesia Plan: Spinal   Post-op Pain Management: Tylenol PO (pre-op)*   Induction:   PONV Risk Score and Plan: 1 and Treatment may vary due to age or medical condition, Midazolam, Dexamethasone and Ondansetron  Airway Management Planned: Natural Airway  Additional Equipment:   Intra-op Plan:   Post-operative Plan:   Informed Consent: I have reviewed the patients History and Physical, chart, labs and discussed the procedure including the risks, benefits and alternatives for the proposed anesthesia with the patient or authorized representative who has indicated his/her understanding and acceptance.     Dental advisory given  Plan Discussed with: CRNA  Anesthesia Plan Comments:        Anesthesia Quick Evaluation

## 2022-09-12 ENCOUNTER — Ambulatory Visit (HOSPITAL_COMMUNITY): Payer: 59 | Admitting: Anesthesiology

## 2022-09-12 ENCOUNTER — Other Ambulatory Visit: Payer: Self-pay

## 2022-09-12 ENCOUNTER — Ambulatory Visit (HOSPITAL_BASED_OUTPATIENT_CLINIC_OR_DEPARTMENT_OTHER): Payer: 59 | Admitting: Anesthesiology

## 2022-09-12 ENCOUNTER — Ambulatory Visit (HOSPITAL_COMMUNITY)
Admission: RE | Admit: 2022-09-12 | Discharge: 2022-09-12 | Disposition: A | Discharge status: Home / Self Care | Payer: 59 | Attending: Orthopedic Surgery | Admitting: Orthopedic Surgery

## 2022-09-12 ENCOUNTER — Encounter (HOSPITAL_COMMUNITY): Payer: Self-pay | Admitting: Orthopedic Surgery

## 2022-09-12 ENCOUNTER — Encounter (HOSPITAL_COMMUNITY)
Admission: RE | Disposition: A | Discharge status: Home / Self Care | Payer: Self-pay | Source: Home / Self Care | Attending: Orthopedic Surgery

## 2022-09-12 ENCOUNTER — Ambulatory Visit (HOSPITAL_COMMUNITY): Payer: 59

## 2022-09-12 DIAGNOSIS — M2550 Pain in unspecified joint: Secondary | ICD-10-CM | POA: Insufficient documentation

## 2022-09-12 DIAGNOSIS — F411 Generalized anxiety disorder: Secondary | ICD-10-CM | POA: Diagnosis not present

## 2022-09-12 DIAGNOSIS — F418 Other specified anxiety disorders: Secondary | ICD-10-CM | POA: Diagnosis not present

## 2022-09-12 DIAGNOSIS — M1611 Unilateral primary osteoarthritis, right hip: Secondary | ICD-10-CM | POA: Insufficient documentation

## 2022-09-12 DIAGNOSIS — I10 Essential (primary) hypertension: Secondary | ICD-10-CM

## 2022-09-12 DIAGNOSIS — G4730 Sleep apnea, unspecified: Secondary | ICD-10-CM | POA: Diagnosis not present

## 2022-09-12 DIAGNOSIS — F32A Depression, unspecified: Secondary | ICD-10-CM | POA: Insufficient documentation

## 2022-09-12 DIAGNOSIS — K769 Liver disease, unspecified: Secondary | ICD-10-CM

## 2022-09-12 DIAGNOSIS — M459 Ankylosing spondylitis of unspecified sites in spine: Secondary | ICD-10-CM | POA: Diagnosis not present

## 2022-09-12 DIAGNOSIS — M879 Osteonecrosis, unspecified: Secondary | ICD-10-CM | POA: Diagnosis not present

## 2022-09-12 DIAGNOSIS — Z01818 Encounter for other preprocedural examination: Secondary | ICD-10-CM

## 2022-09-12 HISTORY — PX: TOTAL HIP ARTHROPLASTY: SHX124

## 2022-09-12 LAB — CBC
HCT: 41.3 % (ref 39.0–52.0)
Hemoglobin: 14 g/dL (ref 13.0–17.0)
MCH: 32.9 pg (ref 26.0–34.0)
MCHC: 33.9 g/dL (ref 30.0–36.0)
MCV: 97.2 fL (ref 80.0–100.0)
Platelets: 180 10*3/uL (ref 150–400)
RBC: 4.25 MIL/uL (ref 4.22–5.81)
RDW: 12.7 % (ref 11.5–15.5)
WBC: 7.6 10*3/uL (ref 4.0–10.5)
nRBC: 0 % (ref 0.0–0.2)

## 2022-09-12 LAB — COMPREHENSIVE METABOLIC PANEL
ALT: 35 U/L (ref 0–44)
AST: 29 U/L (ref 15–41)
Albumin: 3.9 g/dL (ref 3.5–5.0)
Alkaline Phosphatase: 60 U/L (ref 38–126)
Anion gap: 7 (ref 5–15)
BUN: 9 mg/dL (ref 6–20)
CO2: 27 mmol/L (ref 22–32)
Calcium: 9.2 mg/dL (ref 8.9–10.3)
Chloride: 104 mmol/L (ref 98–111)
Creatinine, Ser: 0.8 mg/dL (ref 0.61–1.24)
GFR, Estimated: >60 mL/min (ref >60)
Glucose, Bld: 116 mg/dL — ABNORMAL HIGH (ref 70–99)
Potassium: 4.5 mmol/L (ref 3.5–5.1)
Sodium: 138 mmol/L (ref 135–145)
Total Bilirubin: 1 mg/dL (ref 0.3–1.2)
Total Protein: 6.8 g/dL (ref 6.5–8.1)

## 2022-09-12 SURGERY — ARTHROPLASTY, HIP, TOTAL,POSTERIOR APPROACH
Anesthesia: Spinal | Site: Hip | Laterality: Right

## 2022-09-12 MED ORDER — ISOPROPYL ALCOHOL 70 % SOLN
Status: DC | PRN
  Administered 2022-09-12: 1 via TOPICAL

## 2022-09-12 MED ORDER — MIDAZOLAM HCL 5 MG/5ML IJ SOLN
INTRAMUSCULAR | Status: DC | PRN
  Administered 2022-09-12: 2 mg via INTRAVENOUS

## 2022-09-12 MED ORDER — METHOCARBAMOL 500 MG IVPB - SIMPLE MED: 500.0000 mg | Freq: Four times a day (QID) | INTRAVENOUS | Status: DC | PRN

## 2022-09-12 MED ORDER — BUPIVACAINE LIPOSOME 1.3 % IJ SUSP
INTRAMUSCULAR | Status: AC
  Filled 2022-09-12: qty 20

## 2022-09-12 MED ORDER — ONDANSETRON HCL 4 MG/2ML IJ SOLN
INTRAMUSCULAR | Status: AC
  Filled 2022-09-12: qty 2

## 2022-09-12 MED ORDER — PHENYLEPHRINE 80 MCG/ML (10ML) SYRINGE FOR IV PUSH (FOR BLOOD PRESSURE SUPPORT)
PREFILLED_SYRINGE | INTRAVENOUS | Status: AC
  Filled 2022-09-12: qty 10

## 2022-09-12 MED ORDER — FENTANYL CITRATE PF 50 MCG/ML IJ SOSY
PREFILLED_SYRINGE | INTRAMUSCULAR | Status: AC
  Administered 2022-09-12: 50 ug via INTRAVENOUS
  Filled 2022-09-12: qty 2

## 2022-09-12 MED ORDER — LACTATED RINGERS IV BOLUS
500.0000 mL | Freq: Once | INTRAVENOUS | Status: AC
  Administered 2022-09-12: 500 mL via INTRAVENOUS

## 2022-09-12 MED ORDER — SODIUM CHLORIDE 0.9 % IV SOLN: INTRAVENOUS | Status: DC

## 2022-09-12 MED ORDER — METHOCARBAMOL 500 MG PO TABS: 500.0000 mg | ORAL_TABLET | Freq: Four times a day (QID) | ORAL | Status: DC | PRN

## 2022-09-12 MED ORDER — POVIDONE-IODINE 10 % EX SWAB
2.0000 | Freq: Once | CUTANEOUS | Status: AC
  Administered 2022-09-12: 2 via TOPICAL

## 2022-09-12 MED ORDER — LACTATED RINGERS IV SOLN: INTRAVENOUS | Status: DC

## 2022-09-12 MED ORDER — TRANEXAMIC ACID 1000 MG/10ML IV SOLN
2000.0000 mg | INTRAVENOUS | Status: DC
  Filled 2022-09-12: qty 20

## 2022-09-12 MED ORDER — 0.9 % SODIUM CHLORIDE (POUR BTL) OPTIME
TOPICAL | Status: DC | PRN
  Administered 2022-09-12: 1000 mL

## 2022-09-12 MED ORDER — TRANEXAMIC ACID-NACL 1000-0.7 MG/100ML-% IV SOLN
1000.0000 mg | INTRAVENOUS | Status: AC
  Administered 2022-09-12: 1000 mg via INTRAVENOUS
  Filled 2022-09-12: qty 100

## 2022-09-12 MED ORDER — NAPROXEN 500 MG PO TABS: 500.0000 mg | ORAL_TABLET | Freq: Two times a day (BID) | ORAL | 0 refills | Status: AC

## 2022-09-12 MED ORDER — OXYCODONE HCL 5 MG PO TABS
5.0000 mg | ORAL_TABLET | ORAL | Status: DC | PRN
  Administered 2022-09-12: 10 mg via ORAL

## 2022-09-12 MED ORDER — SODIUM CHLORIDE (PF) 0.9 % IJ SOLN
INTRAMUSCULAR | Status: AC
  Filled 2022-09-12: qty 50

## 2022-09-12 MED ORDER — PROPOFOL 10 MG/ML IV BOLUS
INTRAVENOUS | Status: DC | PRN
  Administered 2022-09-12: 40 mg via INTRAVENOUS
  Administered 2022-09-12: 100 ug/kg/min via INTRAVENOUS
  Administered 2022-09-12: 40 mg via INTRAVENOUS
  Administered 2022-09-12: 20 mg via INTRAVENOUS

## 2022-09-12 MED ORDER — SODIUM CHLORIDE (PF) 0.9 % IJ SOLN
INTRAMUSCULAR | Status: DC | PRN
  Administered 2022-09-12: 60 mL via INTRAVENOUS

## 2022-09-12 MED ORDER — ONDANSETRON HCL 4 MG/2ML IJ SOLN
INTRAMUSCULAR | Status: DC | PRN
  Administered 2022-09-12: 4 mg via INTRAVENOUS

## 2022-09-12 MED ORDER — OXYCODONE HCL 5 MG PO TABS
ORAL_TABLET | ORAL | Status: AC
  Filled 2022-09-12: qty 2

## 2022-09-12 MED ORDER — SODIUM CHLORIDE (PF) 0.9 % IJ SOLN
INTRAMUSCULAR | Status: AC
  Filled 2022-09-12: qty 10

## 2022-09-12 MED ORDER — ACETAMINOPHEN 500 MG PO TABS
1000.0000 mg | ORAL_TABLET | Freq: Once | ORAL | Status: AC
  Administered 2022-09-12: 1000 mg via ORAL
  Filled 2022-09-12: qty 2

## 2022-09-12 MED ORDER — BUPIVACAINE IN DEXTROSE 0.75-8.25 % IT SOLN
INTRATHECAL | Status: DC | PRN
  Administered 2022-09-12: 1.8 mL via INTRATHECAL

## 2022-09-12 MED ORDER — ACETAMINOPHEN 500 MG PO TABS: 1000.0000 mg | ORAL_TABLET | Freq: Three times a day (TID) | ORAL | 0 refills | Status: AC | PRN

## 2022-09-12 MED ORDER — FENTANYL CITRATE (PF) 100 MCG/2ML IJ SOLN
INTRAMUSCULAR | Status: AC
  Filled 2022-09-12: qty 2

## 2022-09-12 MED ORDER — MIDAZOLAM HCL 2 MG/2ML IJ SOLN
INTRAMUSCULAR | Status: AC
  Filled 2022-09-12: qty 2

## 2022-09-12 MED ORDER — OXYCODONE HCL 5 MG PO TABS
ORAL_TABLET | ORAL | Status: AC
  Administered 2022-09-12: 10 mg via ORAL
  Filled 2022-09-12: qty 2

## 2022-09-12 MED ORDER — DEXAMETHASONE SODIUM PHOSPHATE 10 MG/ML IJ SOLN
INTRAMUSCULAR | Status: DC | PRN
  Administered 2022-09-12: 10 mg via INTRAVENOUS

## 2022-09-12 MED ORDER — ORAL CARE MOUTH RINSE: 15.0000 mL | Freq: Once | OROMUCOSAL | Status: AC

## 2022-09-12 MED ORDER — CELECOXIB 200 MG PO CAPS
400.0000 mg | ORAL_CAPSULE | Freq: Once | ORAL | Status: DC
  Filled 2022-09-12: qty 2

## 2022-09-12 MED ORDER — ASPIRIN 81 MG PO TBEC: 81.0000 mg | DELAYED_RELEASE_TABLET | Freq: Two times a day (BID) | ORAL | 0 refills | Status: AC

## 2022-09-12 MED ORDER — LIDOCAINE HCL (PF) 2 % IJ SOLN
INTRAMUSCULAR | Status: AC
  Filled 2022-09-12: qty 5

## 2022-09-12 MED ORDER — ISOPROPYL ALCOHOL 70 % SOLN
Status: AC
  Filled 2022-09-12: qty 480

## 2022-09-12 MED ORDER — LACTATED RINGERS IV BOLUS
250.0000 mL | Freq: Once | INTRAVENOUS | Status: AC
  Administered 2022-09-12: 250 mL via INTRAVENOUS

## 2022-09-12 MED ORDER — FENTANYL CITRATE PF 50 MCG/ML IJ SOSY
25.0000 ug | PREFILLED_SYRINGE | INTRAMUSCULAR | Status: DC | PRN
  Administered 2022-09-12: 50 ug via INTRAVENOUS

## 2022-09-12 MED ORDER — CHLORHEXIDINE GLUCONATE 0.12 % MT SOLN
15.0000 mL | Freq: Once | OROMUCOSAL | Status: AC
  Administered 2022-09-12: 15 mL via OROMUCOSAL

## 2022-09-12 MED ORDER — OXYCODONE HCL 5 MG PO TABS: 5.0000 mg | ORAL_TABLET | ORAL | 0 refills | Status: AC | PRN

## 2022-09-12 MED ORDER — ONDANSETRON HCL 4 MG PO TABS: 4.0000 mg | ORAL_TABLET | Freq: Three times a day (TID) | ORAL | 0 refills | Status: AC | PRN

## 2022-09-12 MED ORDER — BUPIVACAINE LIPOSOME 1.3 % IJ SUSP
INTRAMUSCULAR | Status: DC | PRN
  Administered 2022-09-12: 20 mL

## 2022-09-12 MED ORDER — BUPIVACAINE LIPOSOME 1.3 % IJ SUSP: 10.0000 mL | Freq: Once | INTRAMUSCULAR | Status: DC

## 2022-09-12 MED ORDER — WATER FOR IRRIGATION, STERILE IR SOLN
Status: DC | PRN
  Administered 2022-09-12: 2000 mL

## 2022-09-12 MED ORDER — DEXAMETHASONE SODIUM PHOSPHATE 10 MG/ML IJ SOLN
INTRAMUSCULAR | Status: AC
  Filled 2022-09-12: qty 1

## 2022-09-12 MED ORDER — CEFAZOLIN SODIUM-DEXTROSE 2-4 GM/100ML-% IV SOLN
2.0000 g | INTRAVENOUS | Status: AC
  Administered 2022-09-12: 2 g via INTRAVENOUS
  Filled 2022-09-12: qty 100

## 2022-09-12 MED ORDER — METHOCARBAMOL 500 MG IVPB - SIMPLE MED
INTRAVENOUS | Status: AC
  Administered 2022-09-12: 500 mg via INTRAVENOUS
  Filled 2022-09-12: qty 55

## 2022-09-12 MED ORDER — METHOCARBAMOL 500 MG PO TABS: 500.0000 mg | ORAL_TABLET | Freq: Three times a day (TID) | ORAL | 0 refills | Status: AC | PRN

## 2022-09-12 SURGICAL SUPPLY — 72 items
ADH SKN CLS APL DERMABOND .7 (GAUZE/BANDAGES/DRESSINGS) ×1
APL PRP STRL LF DISP 70% ISPRP (MISCELLANEOUS) ×2
BAG COUNTER SPONGE SURGICOUNT (BAG) IMPLANT
BAG DECANTER FOR FLEXI CONT (MISCELLANEOUS) ×2 IMPLANT
BAG SPEC THK2 15X12 ZIP CLS (MISCELLANEOUS) ×1
BAG SPNG CNTER NS LX DISP (BAG)
BAG ZIPLOCK 12X15 (MISCELLANEOUS) ×2 IMPLANT
BLADE SAW SAG 25X90X1.19 (BLADE) ×2 IMPLANT
CHLORAPREP W/TINT 26 (MISCELLANEOUS) ×4 IMPLANT
COVER SURGICAL LIGHT HANDLE (MISCELLANEOUS) ×2 IMPLANT
DERMABOND ADVANCED .7 DNX12 (GAUZE/BANDAGES/DRESSINGS) ×2 IMPLANT
DRAPE HIP W/POCKET STRL (MISCELLANEOUS) ×2 IMPLANT
DRAPE INCISE IOBAN 66X45 STRL (DRAPES) ×2 IMPLANT
DRAPE INCISE IOBAN 85X60 (DRAPES) ×2 IMPLANT
DRAPE POUCH INSTRU U-SHP 10X18 (DRAPES) ×2 IMPLANT
DRAPE SHEET LG 3/4 BI-LAMINATE (DRAPES) ×6 IMPLANT
DRAPE SURG 17X11 SM STRL (DRAPES) ×2 IMPLANT
DRAPE U-SHAPE 47X51 STRL (DRAPES) ×4 IMPLANT
DRESSING AQUACEL AG SP 3.5X10 (GAUZE/BANDAGES/DRESSINGS) ×2 IMPLANT
DRSG AQUACEL AG SP 3.5X10 (GAUZE/BANDAGES/DRESSINGS) ×1
DRSG MEPILEX POST OP 4X12 (GAUZE/BANDAGES/DRESSINGS) IMPLANT
ELECT BLADE TIP CTD 4 INCH (ELECTRODE) ×2 IMPLANT
ELECT REM PT RETURN 15FT ADLT (MISCELLANEOUS) ×2 IMPLANT
GLOVE BIO SURGEON STRL SZ 6.5 (GLOVE) ×4 IMPLANT
GLOVE BIO SURGEON STRL SZ7 (GLOVE) IMPLANT
GLOVE BIOGEL PI IND STRL 6.5 (GLOVE) ×2 IMPLANT
GLOVE BIOGEL PI IND STRL 8 (GLOVE) ×2 IMPLANT
GLOVE INDICATOR 7.0 STRL GRN (GLOVE) IMPLANT
GLOVE SURG ORTHO 8.0 STRL STRW (GLOVE) ×4 IMPLANT
GLOVE SURG SS PI 6.5 STRL IVOR (GLOVE) IMPLANT
GOWN STRL REUS W/ TWL XL LVL3 (GOWN DISPOSABLE) ×4 IMPLANT
GOWN STRL REUS W/TWL XL LVL3 (GOWN DISPOSABLE) ×2
HANDPIECE INTERPULSE COAX TIP (DISPOSABLE) ×1
HEAD FEMORAL HIP (Head) IMPLANT
HOLDER FOLEY CATH W/STRAP (MISCELLANEOUS) ×2 IMPLANT
HOOD PEEL AWAY T7 (MISCELLANEOUS) ×6 IMPLANT
INSERT TRIDENT POLY 36 0DEG (Insert) IMPLANT
JET LAVAGE IRRISEPT WOUND (IRRIGATION / IRRIGATOR)
KIT BASIN OR (CUSTOM PROCEDURE TRAY) ×2 IMPLANT
KIT TURNOVER KIT A (KITS) IMPLANT
LAVAGE JET IRRISEPT WOUND (IRRIGATION / IRRIGATOR) IMPLANT
MANIFOLD NEPTUNE II (INSTRUMENTS) ×2 IMPLANT
MARKER SKIN DUAL TIP RULER LAB (MISCELLANEOUS) ×2 IMPLANT
NEEDLE HYPO 22GX1.5 SAFETY (NEEDLE) IMPLANT
NS IRRIG 1000ML POUR BTL (IV SOLUTION) ×2 IMPLANT
PACK TOTAL JOINT (CUSTOM PROCEDURE TRAY) ×2 IMPLANT
PRESSURIZER FEMORAL UNIV (MISCELLANEOUS) IMPLANT
PROTECTOR NERVE ULNAR (MISCELLANEOUS) ×2 IMPLANT
RETRIEVER SUT HEWSON (MISCELLANEOUS) ×2 IMPLANT
SCREW HEX LP 6.5X20 (Screw) IMPLANT
SCREW HEX LP 6.5X35 (Screw) IMPLANT
SEALER BIPOLAR AQUA 6.0 (INSTRUMENTS) ×2 IMPLANT
SET HNDPC FAN SPRY TIP SCT (DISPOSABLE) IMPLANT
SHELL ACETABUL CLUSTER SZ 54 (Shell) IMPLANT
SPIKE FLUID TRANSFER (MISCELLANEOUS) ×6 IMPLANT
STEM ACCOLADE II SZ8 (Stem) IMPLANT
SUCTION FRAZIER HANDLE 12FR (TUBING) ×1
SUCTION TUBE FRAZIER 12FR DISP (TUBING) ×2 IMPLANT
SUT BONE WAX W31G (SUTURE) ×2 IMPLANT
SUT ETHIBOND #5 BRAIDED 30INL (SUTURE) ×2 IMPLANT
SUT MNCRL AB 3-0 PS2 18 (SUTURE) ×2 IMPLANT
SUT STRATAFIX 0 PDS 27 VIOLET (SUTURE) ×1
SUT STRATAFIX PDO 1 14 VIOLET (SUTURE) ×1
SUT STRATFX PDO 1 14 VIOLET (SUTURE) ×1
SUT VIC AB 2-0 CT2 27 (SUTURE) ×4 IMPLANT
SUTURE STRATFX 0 PDS 27 VIOLET (SUTURE) ×2 IMPLANT
SUTURE STRATFX PDO 1 14 VIOLET (SUTURE) ×2 IMPLANT
SYR 20ML LL LF (SYRINGE) ×4 IMPLANT
TOWEL OR 17X26 10 PK STRL BLUE (TOWEL DISPOSABLE) ×2 IMPLANT
TRAY FOLEY MTR SLVR 16FR STAT (SET/KITS/TRAYS/PACK) ×2 IMPLANT
TUBE SUCTION HIGH CAP CLEAR NV (SUCTIONS) IMPLANT
WATER STERILE IRR 1000ML POUR (IV SOLUTION) ×4 IMPLANT

## 2022-09-12 NOTE — Evaluation (Signed)
Physical Therapy Evaluation Patient Details Name: Dean Ferguson MRN: [id number] DOB: [birth year] Today's Date: 09/12/2022  History of Present Illness  Pt is a 36 yo male presenting s/p R-THA, posterior approach without formal precautions. PMH: L Post THA, anxiety, GERD, OSA on CPAP, Anklyosing spondlyitis, GAD, ETOH abuse, chronic back pain, HTN, gout.  Clinical Impression  Patient evaluated by Physical Therapy with no further acute PT needs identified. All education has been completed and the patient has no further questions.  Pt doing well today, familiar with techniques from recent THA in September.  Ready to d/c with family assist as needed from PT standpoint. Verbally reviewed THA HEP, pt able to recall from last surgery.   See below for any follow-up Physical Therapy or equipment needs. PT is signing off. Thank you for this referral.        Recommendations for follow up therapy are one component of a multi-disciplinary discharge planning process, led by the attending physician.  Recommendations may be updated based on patient status, additional functional criteria and insurance authorization.  Follow Up Recommendations Follow physician's recommendations for discharge plan and follow up therapies      Assistance Recommended at Discharge Intermittent Supervision/Assistance  Patient can return home with the following  Help with stairs or ramp for entrance;Assist for transportation    Equipment Recommendations None recommended by PT  Recommendations for Other Services       Functional Status Assessment       Precautions / Restrictions Precautions Precautions: None Precaution Comments: "no formal hip precautions" Restrictions Weight Bearing Restrictions: No Other Position/Activity Restrictions: WBAT      Mobility  Bed Mobility Overal bed mobility: Needs Assistance Bed Mobility: Supine to Sit     Supine to sit: Supervision          Transfers Overall transfer  level: Needs assistance Equipment used: Rolling walker (2 wheels) Transfers: Sit to/from Stand Sit to Stand: Supervision, Modified independent (Device/Increase time)                Ambulation/Gait Ambulation/Gait assistance: Modified independent (Device/Increase time) Gait Distance (Feet): 100 Feet Assistive device: Rolling walker (2 wheels) Gait Pattern/deviations: Step-to pattern, Decreased stance time - right, Wide base of support Gait velocity: decr     General Gait Details: no physical assist, no LOB. steady with RW  Stairs Stairs: Yes Stairs assistance: Min assist Stair Management: Step to pattern, Backwards, No rails, With walker Number of Stairs: 2 General stair comments: cues for sequence  Wheelchair Mobility    Modified Rankin (Stroke Patients Only)       Balance Overall balance assessment: Mild deficits observed, not formally tested (d/t surgery ths date)                                           Pertinent Vitals/Pain Pain Assessment Pain Assessment: 0-10 Pain Score: 4  Pain Location: right hip Pain Descriptors / Indicators: Sore Pain Intervention(s): Limited activity within patient's tolerance, Monitored during session, Premedicated before session, Repositioned    Home Living Family/patient expects to be discharged to:: Private residence Living Arrangements: Spouse/significant other;Children Available Help at Discharge: Family;Available 24 hours/day Type of Home: House Home Access: Stairs to enter Entrance Stairs-Rails: None Entrance Stairs-Number of Steps: 3   Home Layout: Two level;Able to live on main level with bedroom/bathroom Home Equipment: Rollator (4 wheels);Cane - single Librarian, academic (2 wheels)  Prior Function Prior Level of Function : Independent/Modified Independent;Working/employed;Driving               ADLs Comments: IND     Hand Dominance        Extremity/Trunk Assessment   Upper  Extremity Assessment Upper Extremity Assessment: Overall WFL for tasks assessed    Lower Extremity Assessment Lower Extremity Assessment: RLE deficits/detail RLE Deficits / Details: ankle WFL, knee and hip grossly 3/5, limited by post op pain       Communication   Communication: No difficulties  Cognition Arousal/Alertness: Awake/alert Behavior During Therapy: WFL for tasks assessed/performed Overall Cognitive Status: Within Functional Limits for tasks assessed                                          General Comments      Exercises     Assessment/Plan    PT Assessment  (follow MD orders for any f/u PT)  PT Problem List         PT Treatment Interventions      PT Goals (Current goals can be found in the Care Plan section)  Acute Rehab PT Goals Patient Stated Goal: home today PT Goal Formulation: All assessment and education complete, DC therapy    Frequency       Co-evaluation               AM-PAC PT "6 Clicks" Mobility  Outcome Measure Help needed turning from your back to your side while in a flat bed without using bedrails?: None Help needed moving from lying on your back to sitting on the side of a flat bed without using bedrails?: None Help needed moving to and from a bed to a chair (including a wheelchair)?: None Help needed standing up from a chair using your arms (e.g., wheelchair or bedside chair)?: None Help needed to walk in hospital room?: None Help needed climbing 3-5 steps with a railing? : A Little 6 Click Score: 23    End of Session Equipment Utilized During Treatment: Gait belt Activity Tolerance: Patient tolerated treatment well Patient left: with call bell/phone within reach;with family/visitor present;in bed (EOB)   PT Visit Diagnosis: Difficulty in walking, not elsewhere classified (R26.2)    Time: 1444-1500 PT Time Calculation (min) (ACUTE ONLY): 16 min   Charges:   PT Evaluation $PT Eval Low Complexity: 1  Low          Chanequa Spees, PT  Acute Rehab Dept [facility]) [phone]  [facility] Weekend Pager (Saturday/Sunday only)  [phone]  09/12/2022   [facility] 09/12/2022, 3:08 PM

## 2022-09-12 NOTE — Interval H&P Note (Signed)
The patient has been re-examined, and the chart reviewed, and there have been no interval changes to the documented history and physical.    Plan for R THA for R hip AVN  The operative side was examined and the patient was confirmed to have sensation to DPN, SPN, TN intact, Motor EHL, ext, flex 5/5, and DP 2+, PT 2+, No significant edema.   The risks, benefits, and alternatives have been discussed at length with patient, and the patient is willing to proceed.  Right hip marked. Consent has been signed.  

## 2022-09-12 NOTE — Op Note (Addendum)
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 male who has a diagnosis of Right hip avascular necrosis with severe degenerative joint and elected for surgical management after failing conservative treatment.  The risks benefits and alternatives were discussed with the patient including but not limited to the risks of nonoperative treatment, versus surgical intervention including infection, bleeding, nerve injury, periprosthetic fracture, the need for revision surgery, dislocation, leg length discrepancy, blood clots, cardiopulmonary complications, morbidity, mortality, among others, and they were willing to proceed.     OPERATIVE REPORT     SURGEON:  Charlies Constable, MD    ASSISTANT: April Green, RNFA, (Present throughout the entire procedure,  necessary for completion of procedure in a timely manner, assisting with retraction, instrumentation, and closure)     ANESTHESIA: Spinal  ESTIMATED BLOOD LOSS: 557DU    COMPLICATIONS:  None.    COMPONENTS:   Stryker 54 mm Trident 2 acetabular shell, Trident X3 neutral polyethylene liner, 6.5 hex screws x 2, Accolade 2 size #8 with 127 degree neck angle stem, 36+5 ceramic head Implant Name Type Inv. Item Serial No. Manufacturer Lot No. LRB No. Used Action  SHELL ACETABUL CLUSTER SZ 54 - [id number] Shell SHELL ACETABUL CLUSTER SZ 54  [employer] [id number] Right 1 Implanted  SCREW HEX LP 6.5X35 - [id number] Screw SCREW HEX LP 6.5X35  [employer] [id number] Right 1 Implanted  SCREW HEX LP 6.5X20 - [id number] Screw SCREW HEX LP 6.5X20  [employer] [id number] Right 1 Implanted  INSERT TRIDENT POLY 36 0DEG - [id number] Insert INSERT TRIDENT POLY 36 0DEG  [employer]  [employer] [id number] Right 1 Implanted  STEM ACCOLADE II SZ8 - [id number] Stem STEM [employer] [id number] Right 1 Implanted  HEAD FEMORAL HIP - [id number] Head HEAD FEMORAL HIP  [employer] [id number] Right 1 Implanted      PROCEDURE IN DETAIL:   The patient was met in the holding area and  identified.  The appropriate hip was identified and marked at the operative site.  The patient was then transported to the OR  and  placed under anesthesia.  At that point, the patient was  placed in the lateral decubitus position with the operative side up and  secured to the operating room table  and all bony prominences padded. A subaxillary role was also placed.    The operative lower extremity was prepped from the iliac crest to the distal leg.  Sterile draping was performed.  Preoperative antibiotics, 2 gm of Ancef,1 gm of Tranexamic Acid, and 8 mg of Decadron administered. Time out was performed prior to incision.      A routine posterolateral approach was utilized via sharp dissection  carried down to the subcutaneous tissue.  Gross bleeders were Bovie coagulated.  The iliotibial band was identified and incised along the length of the skin incision through the glute max fascia.  Charnley retractor was placed with care to protect the sciatic nerve posteriorly.  With the hip internally rotated, the piriformis tendon was identified and released from the femoral insertion and tagged with a #5 Ethibond.  A capsulotomy was then performed off the femoral insertion and also tagged with a #5 Ethibond.    The femoral neck was exposed, and I resected the  femoral neck based on preoperative templating relative to the lesser trochanter.    I then exposed the deep acetabulum, cleared out any tissue including the ligamentum teres.  After adequate visualization, I excised the labrum.  I then started reaming with a 50 mm reamer, first medializing to the floor of the cotyloid fossa, and then in the  position of the cup aiming towards the greater sciatic notch, matching the version of the transverse acetabular ligament and tucked under the anterior wall. I reamed up to 54 mm reamer with good bony bed preparation and a 54 mm cup was chosen.  The real cup was then impacted into place.  Appropriate version and inclination was confirmed clinically matching their bony anatomy, and also with the use of the jig.  I placed 2 screws in the posterior superior quadrant to augment fixation.  A neutral liner was placed and impacted. It was confirmed to be appropriately seated and the acetabular retractors were removed.    I then prepared the proximal femur using the box cutter, Charnley awl, and then sequentially broached starting with 0 up to a size 7.  A trial broach, neck, and head was utilized, and I reduced the hip.  Felt that there we were still slightly short compared to the contralateral side with a +0 trial head.  We had impingement with extension external rotation without subluxation.  In 90 degrees of flexion we had subluxation at about 45 degrees of internal rotation.  Elected to try the +2.5 head ball.  Felt we were still slightly short no longer impinging posteriorly and with stability at 90 degrees of flexion up to 60 degrees of internal rotation.  Flatplate x-ray was performed which confirmed good position of components.  There was room for likely additional fell on the femoral side.  Length was slightly short.  Elected to work up to a size 8 broach to add a little additional length..  The cup was adequately medialized so follow we may need additional offset on the side compared to the left side using a longer head ball. Also noticed there was some anterior osteophyte on the pelvis that the trochanter was impinging on.  The trial broach was removed and used a osteophyte to remove some of this anterior impinging bone.  We then worked up to the size 8 broach which sat about an additional 2 mm proud and  had good fill.  A final femoral prosthesis size 8 was selected. I then impacted the real femoral prosthesis into place.I again trialed and selected a 36+ 1m ball. The hip was then reduced and taken through a range of motion. There was no impingement with full extension and 90 degrees external rotation.  The hip was stable at the position of sleep and with 90 degrees flexion and 70 degrees of internal rotation. Leg lengths were  again assessed and felt to be restored.  We then opened, and I impacted the real head ball into place.  The posterior capsule was then closed with #5 Ethibond.  The piriformis was repaired through the base of the abductor tendon using a [place] suture passer.  I then irrigated the hip copiously with dilute Betadine and with normal saline pulse lavage. Periarticular injection was then performed with Exparel.   We repaired the fascia #1 barbed suture, followed by 0 barbed suture for the subcutaneous fat.  Skin was closed with 2-0 Vicryl and 3-0 Monocryl.  Dermabond and clear Mepilex dressing were applied given history of allergic reaction with  the Aquacel adhesive. The patient was then awakened and returned to PACU in stable and satisfactory condition.  Leg lengths in the supine position were assessed and felt to be clinically equal. There were no complications.  Post op recs: WB: WBAT RLE, No formal hip precautions Abx: ancef Imaging: PACU pelvis Xray Dressing: Aquacell, keep intact until follow up DVT prophylaxis: Aspirin 81BID starting POD1 Follow up: 2 weeks after surgery for a wound check with Dr. Zachery Dakins at [facility].  Address: [address], [place], [place] [address]  Office Phone: [phone]   Charlies Constable, MD Orthopedic Surgeon

## 2022-09-12 NOTE — Transfer of Care (Signed)
Immediate Anesthesia Transfer of Care Note  Patient: Dean Ferguson  Procedure(s) Performed: TOTAL HIP ARTHROPLASTY (Right: Hip)  Patient Location: PACU  Anesthesia Type:MAC combined with regional for post-op pain  Level of Consciousness: sedated and drowsy  Airway & Oxygen Therapy: Patient Spontanous Breathing and Patient connected to face mask oxygen  Post-op Assessment: Report given to RN and Post -op Vital signs reviewed and stable  Post vital signs: stable  Last Vitals:  Vitals Value Taken Time  BP 126/76 09/12/22 1000  Temp    Pulse 67 09/12/22 1000  Resp 14 09/12/22 1000  SpO2 100 % 09/12/22 1000  Vitals shown include unvalidated device data.  Last Pain:  Vitals:   09/12/22 0539  TempSrc: Oral         Complications: No notable events documented.

## 2022-09-12 NOTE — Anesthesia Procedure Notes (Signed)
Spinal  Patient location during procedure: OR Start time: 09/12/2022 7:33 AM End time: 09/12/2022 7:35 AM Reason for block: surgical anesthesia Staffing Performed: anesthesiologist  Anesthesiologist: Elmer Picker, MD Performed by: Elmer Picker, MD Authorized by: Elmer Picker, MD   Preanesthetic Checklist Completed: patient identified, IV checked, risks and benefits discussed, surgical consent, monitors and equipment checked, pre-op evaluation and timeout performed Spinal Block Patient position: sitting Prep: DuraPrep and site prepped and draped Patient monitoring: cardiac monitor, continuous pulse ox and blood pressure Approach: midline Location: L3-4 Injection technique: single-shot Needle Needle type: Pencan  Needle gauge: 24 G Needle length: 9 cm Assessment Sensory level: T6 Events: CSF return Additional Notes Functioning IV was confirmed and monitors were applied. Sterile prep and drape, including hand hygiene and sterile gloves were used. The patient was positioned and the spine was prepped. The skin was anesthetized with lidocaine.  Free flow of clear CSF was obtained prior to injecting local anesthetic into the CSF.  The spinal needle aspirated freely following injection.  The needle was carefully withdrawn.  The patient tolerated the procedure well.

## 2022-09-12 NOTE — Anesthesia Postprocedure Evaluation (Signed)
Anesthesia Post Note  Patient: Dean Ferguson  Procedure(s) Performed: TOTAL HIP ARTHROPLASTY (Right: Hip)     Patient location during evaluation: PACU Anesthesia Type: Spinal Level of consciousness: oriented and awake and alert Pain management: pain level controlled Vital Signs Assessment: post-procedure vital signs reviewed and stable Respiratory status: spontaneous breathing, respiratory function stable and patient connected to nasal cannula oxygen Cardiovascular status: blood pressure returned to baseline and stable Postop Assessment: no headache, no backache and no apparent nausea or vomiting Anesthetic complications: no  No notable events documented.  Last Vitals:  Vitals:   09/12/22 1201 09/12/22 1300  BP: (!) 140/82 134/83  Pulse: (!) 58   Resp: 14   Temp: (!) 36.1 C   SpO2: 99%     Last Pain:  Vitals:   09/12/22 1300  TempSrc:   PainSc: Asleep                 [name] L Nikiesha Milford

## 2022-09-12 NOTE — Discharge Instructions (Signed)

## 2022-09-13 ENCOUNTER — Encounter (HOSPITAL_COMMUNITY): Payer: Self-pay | Admitting: Orthopedic Surgery

## 2022-09-14 ENCOUNTER — Ambulatory Visit: Payer: 59 | Attending: Orthopedic Surgery

## 2022-09-14 ENCOUNTER — Other Ambulatory Visit: Payer: Self-pay

## 2022-09-14 DIAGNOSIS — M25651 Stiffness of right hip, not elsewhere classified: Secondary | ICD-10-CM | POA: Diagnosis present

## 2022-09-14 DIAGNOSIS — M6281 Muscle weakness (generalized): Secondary | ICD-10-CM | POA: Diagnosis present

## 2022-09-14 DIAGNOSIS — M25551 Pain in right hip: Secondary | ICD-10-CM | POA: Insufficient documentation

## 2022-09-14 NOTE — Therapy (Signed)
OUTPATIENT PHYSICAL THERAPY LOWER EXTREMITY EVALUATION   Patient Name: Dean Ferguson MRN: [id number] DOB:07/05/1986, 36 y.o., male Today's Date: 09/14/2022  END OF SESSION:  PT End of Session - 09/14/22 1253     Visit Number 1    Number of Visits 12    Date for PT Re-Evaluation 11/18/22    PT Start Time 1251    PT Stop Time 1331    PT Time Calculation (min) 40 min    Activity Tolerance Patient tolerated treatment well    Behavior During Therapy [facility] for tasks assessed/performed             Past Medical History:  Diagnosis Date   Anxiety    Arthritis    Fatty liver 07/15/2019   GERD (gastroesophageal reflux disease)    History of kidney stones    Hx of ankylosing spondylitis    Sleep apnea    cpap   Past Surgical History:  Procedure Laterality Date   benign cyst removed from tongue      LEG SURGERY Right    [place]   right foot surgery      SHOULDER SURGERY Right    [place] Left 06/22/2022   Procedure: TOTAL HIP ARTHROPLASTY;  Surgeon: Willaim Sheng, MD;  Location: [facility] ORS;  Service: Orthopedics;  Laterality: Left;   TOTAL HIP ARTHROPLASTY Right 09/12/2022   Procedure: TOTAL HIP ARTHROPLASTY;  Surgeon: Willaim Sheng, MD;  Location: [facility];  Service: Orthopedics;  Laterality: Right;   Patient Active Problem List   Diagnosis Date Noted   History of left hip replacement 08/26/2022   Ankylosing spondylitis ([place]) 02/24/2022   GAD (generalized anxiety disorder) 02/03/2020   Obesity (BMI 30-39.9) 02/03/2020   Alcohol abuse 02/03/2020   Elevated liver enzymes 02/03/2020   Skin tag 10/15/2019   Fatty liver 07/15/2019   HLA B27 (HLA B27 positive) 12/28/2018   Iritis of left eye 12/28/2018   Chronic back pain 12/28/2018   Severe obstructive sleep apnea-hypopnea syndrome 10/05/2018   Essential hypertension 10/01/2018   OSA (obstructive sleep apnea) 07/23/2018   Psychophysiological insomnia 07/23/2018    Depression, major, single episode, mild ([place]) 06/24/2018   Gout 05/28/2018   Snoring 05/24/2018    PCP: Sharion Balloon, FNP   REFERRING PROVIDER: Willaim Sheng, MD   REFERRING DIAG: Post op Right Total Hip Replacement  THERAPY DIAG:  Pain in right hip  Stiffness of right hip, not elsewhere classified  Muscle weakness (generalized)  Rationale for Evaluation and Treatment: Rehabilitation  ONSET DATE: 09/12/22  SUBJECTIVE:   SUBJECTIVE STATEMENT: Patient reports that he had his right hip replaced on 09/12/22. He notes that his incision still hurts, but he has noticed that his motion is already better. He has been doing some of the exercises that he was given prior to his last hip replacement. He has noticed that his left thigh is "asleep" since the surgery, but he thinks this may be due to the nerve block.   PERTINENT HISTORY: HTN, ankylosing spondylitis, depression, OA, and obesity  PAIN:  Are you having pain? Yes: NPRS scale: 5/10 Pain location: incision Pain description: sore, constant ache Aggravating factors: sitting on it, pressure Relieving factors: ice  PRECAUTIONS: Posterior hip  WEIGHT BEARING RESTRICTIONS: No  FALLS:  Has patient fallen in last 6 months? No  LIVING ENVIRONMENT: Lives with: lives with their family Lives in: House/apartment Stairs: Yes: Internal: does not go upstairs steps; can reach  both and External: 3 steps; none; step to pattern Has following equipment at home: Gilford Rile - 2 wheeled  OCCUPATION: Full time at [employer], but working from home currently   PLOF: Independent  PATIENT GOALS: walk without an assistive device, play with his children, improved strength, and be able to run short distances as needed  NEXT MD VISIT: 09/27/22  OBJECTIVE:   DIAGNOSTIC FINDINGS: 09/12/22 hip x-ray IMPRESSION: Right total hip arthroplasty without evidence of acute complication. PATIENT SURVEYS:  FOTO 16.48  COGNITION: Overall cognitive  status: Within functional limits for tasks assessed     SENSATION: Patient reports no numbness or tingling  EDEMA:  Minimal right hip edema observed   LEG LENGTH: Right 36.25 inches; Left 36.2 inches  PALPATION: TTP: right IT band and along incision  LOWER EXTREMITY ROM:  Active ROM Right eval Left eval  Hip flexion 97 109  Hip extension    Hip abduction 25   Hip adduction    Hip internal rotation    Hip external rotation    Knee flexion    Knee extension    Ankle dorsiflexion    Ankle plantarflexion    Ankle inversion    Ankle eversion     (Blank rows = not tested)  LOWER EXTREMITY MMT: not tested due to surgical condition   FUNCTIONAL TESTS:  Patient required upper extremity assistance for sit to stand transfers  GAIT: Assistive device utilized: Environmental consultant - 2 wheeled Level of assistance: Modified independence Comments: Step to pattern, decreased stride length, gait speed, weightbearing on right lower extremity, and right heel strike   TODAY'S TREATMENT:                                                                                                                              DATE:                                     12/20 EXERCISE LOG  Exercise Repetitions and Resistance Comments  Nustep  L4 x 8 minutes                    Blank cell = exercise not performed today   PATIENT EDUCATION:  Education details: POC, prognosis, healing, HEP, and goals for therapy Person educated: Patient Education method: Explanation Education comprehension: verbalized understanding  HOME EXERCISE PROGRAM: Reviewed HEP which included heel slides, ankle pumps, and quad sets  ASSESSMENT:  CLINICAL IMPRESSION: Patient is a 36 y.o. male who was seen today for physical therapy evaluation and treatment following a right total hip arthroplasty on 09/12/22.  He presented with low to moderate pain severity and irritability with palpation around his incision being the most  aggravating to his familiar symptoms.  Recommend that he continue with skilled physical therapy to address his impairments to return to his prior level of function.  OBJECTIVE IMPAIRMENTS: Abnormal  gait, decreased activity tolerance, decreased mobility, difficulty walking, decreased ROM, decreased strength, increased edema, impaired flexibility, and pain.   ACTIVITY LIMITATIONS: carrying, lifting, bending, standing, squatting, sleeping, stairs, transfers, bed mobility, bathing, dressing, locomotion level, and caring for others  PARTICIPATION LIMITATIONS: meal prep, cleaning, laundry, driving, shopping, community activity, occupation, and yard work  PERSONAL FACTORS: 3+ comorbidities: HTN, OA, ankylosing spondylitis, depression, and obesity   are also affecting patient's functional outcome.   REHAB POTENTIAL: Good  CLINICAL DECISION MAKING: Stable/uncomplicated  EVALUATION COMPLEXITY: Low   GOALS: Goals reviewed with patient? Yes  SHORT TERM GOALS: Target date: 10/05/22 Patient will be independent with his initial HEP. Baseline: Goal status: INITIAL  2.  Patient will safely ambulate at least 80 feet with a single-point cane or least restrictive assistive device for improved household mobility. Baseline:  Goal status: INITIAL  3.  Patient be able to complete his daily activities without his familiar right hip pain exceeding 3/10. Baseline:  Goal status: INITIAL  LONG TERM GOALS: Target date: 10/26/22  Patient be independent with his advanced HEP. Baseline:  Goal status: INITIAL  2.  Patient will be able to complete his daily activities without his familiar right hip pain exceeding a 1/10. Baseline:  Goal status: INITIAL  3.  Patient will be able to navigate at least 4 steps with a reciprocal pattern for improved household mobility. Baseline:  Goal status: INITIAL  4.  Patient will be able to safely ambulate at least 80 feet without an assistive device for improved community  and household mobility. Baseline:  Goal status: INITIAL  5.  Patient be able to independently transfer from sitting to standing without upper extremity support for improved lower extremity strength and power. Baseline:  Goal status: INITIAL  PLAN:  PT FREQUENCY: 2x/week  PT DURATION: 6 weeks  PLANNED INTERVENTIONS: Therapeutic exercises, Therapeutic activity, Neuromuscular re-education, Balance training, Gait training, Patient/Family education, Self Care, Joint mobilization, Stair training, Electrical stimulation, Cryotherapy, Moist heat, Vasopneumatic device, Manual therapy, and Re-evaluation  PLAN FOR NEXT SESSION: NuStep, lower extremity strengthening, manual therapy, and modalities   Darlin Coco, PT 09/14/2022, 2:22 PM

## 2022-09-20 ENCOUNTER — Ambulatory Visit: Payer: 59

## 2022-09-20 DIAGNOSIS — M25551 Pain in right hip: Secondary | ICD-10-CM

## 2022-09-20 DIAGNOSIS — M6281 Muscle weakness (generalized): Secondary | ICD-10-CM

## 2022-09-20 DIAGNOSIS — M25651 Stiffness of right hip, not elsewhere classified: Secondary | ICD-10-CM

## 2022-09-20 NOTE — Therapy (Signed)
OUTPATIENT PHYSICAL THERAPY LOWER EXTREMITY TREATMENT   Patient Name: Dean Ferguson MRN: [id number] DOB:[birth year], 36 y.o., male Today's Date: 09/20/2022  END OF SESSION:  PT End of Session - 09/20/22 1354     Visit Number 2    Number of Visits 12    Date for PT Re-Evaluation 11/18/22    PT Start Time 1345    PT Stop Time 1430    PT Time Calculation (min) 45 min    Activity Tolerance Patient tolerated treatment well    Behavior During Therapy [facility] for tasks assessed/performed             Past Medical History:  Diagnosis Date   Anxiety    Arthritis    Fatty liver 07/15/2019   GERD (gastroesophageal reflux disease)    History of kidney stones    Hx of ankylosing spondylitis    Sleep apnea    cpap   Past Surgical History:  Procedure Laterality Date   benign cyst removed from tongue      LEG SURGERY Right    [place]   right foot surgery      SHOULDER SURGERY Right    [place] Left 06/22/2022   Procedure: TOTAL HIP ARTHROPLASTY;  Surgeon: Willaim Sheng, MD;  Location: [facility];  Service: Orthopedics;  Laterality: Left;   TOTAL HIP ARTHROPLASTY Right 09/12/2022   Procedure: TOTAL HIP ARTHROPLASTY;  Surgeon: Willaim Sheng, MD;  Location: [facility];  Service: Orthopedics;  Laterality: Right;   Patient Active Problem List   Diagnosis Date Noted   History of left hip replacement 08/26/2022   Ankylosing spondylitis ([place]) 02/24/2022   GAD (generalized anxiety disorder) 02/03/2020   Obesity (BMI 30-39.9) 02/03/2020   Alcohol abuse 02/03/2020   Elevated liver enzymes 02/03/2020   Skin tag 10/15/2019   Fatty liver 07/15/2019   HLA B27 (HLA B27 positive) 12/28/2018   Iritis of left eye 12/28/2018   Chronic back pain 12/28/2018   Severe obstructive sleep apnea-hypopnea syndrome 10/05/2018   Essential hypertension 10/01/2018   OSA (obstructive sleep apnea) 07/23/2018   Psychophysiological insomnia 07/23/2018    Depression, major, single episode, mild ([place]) 06/24/2018   Gout 05/28/2018   Snoring 05/24/2018    PCP: Sharion Balloon, FNP   REFERRING PROVIDER: Willaim Sheng, MD   REFERRING DIAG: Post op Right Total Hip Replacement  THERAPY DIAG:  Pain in right hip  Stiffness of right hip, not elsewhere classified  Muscle weakness (generalized)  Rationale for Evaluation and Treatment: Rehabilitation  ONSET DATE: 09/12/22  SUBJECTIVE:   SUBJECTIVE STATEMENT: Patient reports that he is a little sore and tender today, but it is not hurting and he has not had any problems since his last appointment.   PERTINENT HISTORY: HTN, ankylosing spondylitis, depression, OA, and obesity  PAIN:  Are you having pain? Yes: NPRS scale: 4/10 Pain location: incision Pain description: sore, constant ache Aggravating factors: sitting on it, pressure Relieving factors: ice  PRECAUTIONS: Posterior hip  WEIGHT BEARING RESTRICTIONS: No  FALLS:  Has patient fallen in last 6 months? No  LIVING ENVIRONMENT: Lives with: lives with their family Lives in: House/apartment Stairs: Yes: Internal: does not go upstairs steps; can reach both and External: 3 steps; none; step to pattern Has following equipment at home: Gilford Rile - 2 wheeled  OCCUPATION: Full time at [employer], but working from home currently   PLOF: Independent  PATIENT GOALS: walk without an assistive device,  play with his children, improved strength, and be able to run short distances as needed  NEXT MD VISIT: 09/27/22  OBJECTIVE: all objective assessments were assessed at his initial evaluation on 09/14/22 unless otherwise noted  DIAGNOSTIC FINDINGS: 09/12/22 hip x-ray IMPRESSION: Right total hip arthroplasty without evidence of acute complication. PATIENT SURVEYS:  FOTO 16.48  COGNITION: Overall cognitive status: Within functional limits for tasks assessed     SENSATION: Patient reports no numbness or tingling  EDEMA:   Minimal right hip edema observed   LEG LENGTH: Right 36.25 inches; Left 36.2 inches  PALPATION: TTP: right IT band and along incision  LOWER EXTREMITY ROM:  Active ROM Right eval Left eval  Hip flexion 97 109  Hip extension    Hip abduction 25   Hip adduction    Hip internal rotation    Hip external rotation    Knee flexion    Knee extension    Ankle dorsiflexion    Ankle plantarflexion    Ankle inversion    Ankle eversion     (Blank rows = not tested)  LOWER EXTREMITY MMT: not tested due to surgical condition   FUNCTIONAL TESTS:  Patient required upper extremity assistance for sit to stand transfers  GAIT: Assistive device utilized: Environmental consultant - 2 wheeled Level of assistance: Modified independence Comments: Step to pattern, decreased stride length, gait speed, weightbearing on right lower extremity, and right heel strike   TODAY'S TREATMENT:                                                                                                                              DATE:                                     12/26 EXERCISE LOG  Exercise Repetitions and Resistance Comments  Nustep  L1 x 16 minutes   Rocker board 3 minutes   LAQ 5# x 3 minutes   Standing HS curl  5# x 3 minutes   Bridge 3 minutes w/ 5 second hold   SLR 2# x 30 reps each    Side lying hip ABD  30 reps each    Seated clams Green t-band x 30 reps     Blank cell = exercise not performed today                                    12/20 EXERCISE LOG  Exercise Repetitions and Resistance Comments  Nustep  L4 x 8 minutes                    Blank cell = exercise not performed today   PATIENT EDUCATION:  Education details: POC, prognosis, healing, HEP, and goals for therapy Person educated: Patient Education method: Explanation Education comprehension: verbalized understanding  HOME EXERCISE PROGRAM: Reviewed HEP which included heel slides, ankle pumps, and quad sets  ASSESSMENT:  CLINICAL  IMPRESSION: Patient was introduced to multiple new interventions for improved lower extremity strength need for improved function with his daily activities. He required minimal cueing with straight leg raises to maintain terminal knee extension to facilitate hip flexor engagement. He reported feeling a little tired upon the conclusion of treatment. He continues to require skilled physical therapy to address his remaining impairments to return to his prior level of function.   OBJECTIVE IMPAIRMENTS: Abnormal gait, decreased activity tolerance, decreased mobility, difficulty walking, decreased ROM, decreased strength, increased edema, impaired flexibility, and pain.   ACTIVITY LIMITATIONS: carrying, lifting, bending, standing, squatting, sleeping, stairs, transfers, bed mobility, bathing, dressing, locomotion level, and caring for others  PARTICIPATION LIMITATIONS: meal prep, cleaning, laundry, driving, shopping, community activity, occupation, and yard work  PERSONAL FACTORS: 3+ comorbidities: HTN, OA, ankylosing spondylitis, depression, and obesity   are also affecting patient's functional outcome.   REHAB POTENTIAL: Good  CLINICAL DECISION MAKING: Stable/uncomplicated  EVALUATION COMPLEXITY: Low   GOALS: Goals reviewed with patient? Yes  SHORT TERM GOALS: Target date: 10/05/22 Patient will be independent with his initial HEP. Baseline: Goal status: INITIAL  2.  Patient will safely ambulate at least 80 feet with a single-point cane or least restrictive assistive device for improved household mobility. Baseline:  Goal status: INITIAL  3.  Patient be able to complete his daily activities without his familiar right hip pain exceeding 3/10. Baseline:  Goal status: INITIAL  LONG TERM GOALS: Target date: 10/26/22  Patient be independent with his advanced HEP. Baseline:  Goal status: INITIAL  2.  Patient will be able to complete his daily activities without his familiar right hip pain  exceeding a 1/10. Baseline:  Goal status: INITIAL  3.  Patient will be able to navigate at least 4 steps with a reciprocal pattern for improved household mobility. Baseline:  Goal status: INITIAL  4.  Patient will be able to safely ambulate at least 80 feet without an assistive device for improved community and household mobility. Baseline:  Goal status: INITIAL  5.  Patient be able to independently transfer from sitting to standing without upper extremity support for improved lower extremity strength and power. Baseline:  Goal status: INITIAL  PLAN:  PT FREQUENCY: 2x/week  PT DURATION: 6 weeks  PLANNED INTERVENTIONS: Therapeutic exercises, Therapeutic activity, Neuromuscular re-education, Balance training, Gait training, Patient/Family education, Self Care, Joint mobilization, Stair training, Electrical stimulation, Cryotherapy, Moist heat, Vasopneumatic device, Manual therapy, and Re-evaluation  PLAN FOR NEXT SESSION: NuStep, lower extremity strengthening, manual therapy, and modalities   Darlin Coco, PT 09/20/2022, 3:31 PM

## 2022-09-22 ENCOUNTER — Ambulatory Visit: Payer: 59

## 2022-09-22 DIAGNOSIS — M25551 Pain in right hip: Secondary | ICD-10-CM

## 2022-09-22 DIAGNOSIS — M6281 Muscle weakness (generalized): Secondary | ICD-10-CM

## 2022-09-22 DIAGNOSIS — M25651 Stiffness of right hip, not elsewhere classified: Secondary | ICD-10-CM

## 2022-09-22 NOTE — Therapy (Signed)
OUTPATIENT PHYSICAL THERAPY LOWER EXTREMITY TREATMENT   Patient Name: Dean Ferguson MRN: [id number] DOB:1985-11-13, 36 y.o., male Today's Date: 09/22/2022  END OF SESSION:  PT End of Session - 09/22/22 1351     Visit Number 3    Number of Visits 12    Date for PT Re-Evaluation 11/18/22    PT Start Time 1345    PT Stop Time 1428    PT Time Calculation (min) 43 min    Activity Tolerance Patient tolerated treatment well    Behavior During Therapy [facility] for tasks assessed/performed             Past Medical History:  Diagnosis Date   Anxiety    Arthritis    Fatty liver 07/15/2019   GERD (gastroesophageal reflux disease)    History of kidney stones    Hx of ankylosing spondylitis    Sleep apnea    cpap   Past Surgical History:  Procedure Laterality Date   benign cyst removed from tongue      LEG SURGERY Right    [place]   right foot surgery      SHOULDER SURGERY Right    [place] Left 06/22/2022   Procedure: TOTAL HIP ARTHROPLASTY;  Surgeon: Willaim Sheng, MD;  Location: [facility];  Service: Orthopedics;  Laterality: Left;   TOTAL HIP ARTHROPLASTY Right 09/12/2022   Procedure: TOTAL HIP ARTHROPLASTY;  Surgeon: Willaim Sheng, MD;  Location: [facility];  Service: Orthopedics;  Laterality: Right;   Patient Active Problem List   Diagnosis Date Noted   History of left hip replacement 08/26/2022   Ankylosing spondylitis ([place]) 02/24/2022   GAD (generalized anxiety disorder) 02/03/2020   Obesity (BMI 30-39.9) 02/03/2020   Alcohol abuse 02/03/2020   Elevated liver enzymes 02/03/2020   Skin tag 10/15/2019   Fatty liver 07/15/2019   HLA B27 (HLA B27 positive) 12/28/2018   Iritis of left eye 12/28/2018   Chronic back pain 12/28/2018   Severe obstructive sleep apnea-hypopnea syndrome 10/05/2018   Essential hypertension 10/01/2018   OSA (obstructive sleep apnea) 07/23/2018   Psychophysiological insomnia 07/23/2018    Depression, major, single episode, mild ([place]) 06/24/2018   Gout 05/28/2018   Snoring 05/24/2018    PCP: Sharion Balloon, FNP   REFERRING PROVIDER: Willaim Sheng, MD   REFERRING DIAG: Post op Right Total Hip Replacement  THERAPY DIAG:  Pain in right hip  Stiffness of right hip, not elsewhere classified  Muscle weakness (generalized)  Rationale for Evaluation and Treatment: Rehabilitation  ONSET DATE: 09/12/22  SUBJECTIVE:   SUBJECTIVE STATEMENT: Patient reports that he may have "overdid it" yesterday and his hip is sore today. However, it does not hurt.   PERTINENT HISTORY: HTN, ankylosing spondylitis, depression, OA, and obesity  PAIN:  Are you having pain? Yes: NPRS scale: 3-4/10 Pain location: incision Pain description: sore, constant ache Aggravating factors: sitting on it, pressure Relieving factors: ice  PRECAUTIONS: Posterior hip  WEIGHT BEARING RESTRICTIONS: No  FALLS:  Has patient fallen in last 6 months? No  LIVING ENVIRONMENT: Lives with: lives with their family Lives in: House/apartment Stairs: Yes: Internal: does not go upstairs steps; can reach both and External: 3 steps; none; step to pattern Has following equipment at home: Gilford Rile - 2 wheeled  OCCUPATION: Full time at [employer], but working from home currently   PLOF: Independent  PATIENT GOALS: walk without an assistive device, play with his children, improved strength, and  be able to run short distances as needed  NEXT MD VISIT: 09/27/22  OBJECTIVE: all objective assessments were assessed at his initial evaluation on 09/14/22 unless otherwise noted  DIAGNOSTIC FINDINGS: 09/12/22 hip x-ray IMPRESSION: Right total hip arthroplasty without evidence of acute complication. PATIENT SURVEYS:  FOTO 16.48  COGNITION: Overall cognitive status: Within functional limits for tasks assessed     SENSATION: Patient reports no numbness or tingling  EDEMA:  Minimal right hip edema observed    LEG LENGTH: Right 36.25 inches; Left 36.2 inches  PALPATION: TTP: right IT band and along incision  LOWER EXTREMITY ROM:  Active ROM Right eval Left eval  Hip flexion 97 109  Hip extension    Hip abduction 25   Hip adduction    Hip internal rotation    Hip external rotation    Knee flexion    Knee extension    Ankle dorsiflexion    Ankle plantarflexion    Ankle inversion    Ankle eversion     (Blank rows = not tested)  LOWER EXTREMITY MMT: not tested due to surgical condition   FUNCTIONAL TESTS:  Patient required upper extremity assistance for sit to stand transfers  GAIT: Assistive device utilized: Environmental consultant - 2 wheeled Level of assistance: Modified independence Comments: Step to pattern, decreased stride length, gait speed, weightbearing on right lower extremity, and right heel strike   TODAY'S TREATMENT:                                                                                                                              DATE:                                     12/28 EXERCISE LOG  Exercise Repetitions and Resistance Comments  Nustep L4 x 15 minutes   Cybex knee extension 20# x 3 minutes BLE  Cybex knee flexion 40# x 3 minutes BLE  Cybex leg press  2 plates; seat 8 x 2.5 minutes   Tandem balance on foam 4 x 30 seconds each   Step up  8" step x 2 minutes   Lateral step up  8" step x 2 minutes   Rocker board  3 minutes Intermittent UE support   Blank cell = exercise not performed today                                    12/26 EXERCISE LOG  Exercise Repetitions and Resistance Comments  Nustep  L1 x 16 minutes   Rocker board 3 minutes   LAQ 5# x 3 minutes   Standing HS curl  5# x 3 minutes   Bridge 3 minutes w/ 5 second hold   SLR 2# x 30 reps each    Side lying hip ABD  30  reps each    Seated clams Green t-band x 30 reps     Blank cell = exercise not performed today                                    12/20 EXERCISE LOG  Exercise Repetitions  and Resistance Comments  Nustep  L4 x 8 minutes                    Blank cell = exercise not performed today   PATIENT EDUCATION:  Education details: POC, prognosis, healing, HEP, and goals for therapy Person educated: Patient Education method: Explanation Education comprehension: verbalized understanding  HOME EXERCISE PROGRAM: Reviewed HEP which included heel slides, ankle pumps, and quad sets  ASSESSMENT:  CLINICAL IMPRESSION: Patient was progressed with multiple new interventions for improved lower extremity strength and stability. He required minimal cueing with today's new interventions for improved eccentric control needed for activities such as descending stairs and squatting. He experienced no significant pain or discomfort with any of today's interventions. He reported feeling tired upon the conclusion of treatment. He continues to require skilled physical therapy to address his remaining impairments to return to his prior level of function.   OBJECTIVE IMPAIRMENTS: Abnormal gait, decreased activity tolerance, decreased mobility, difficulty walking, decreased ROM, decreased strength, increased edema, impaired flexibility, and pain.   ACTIVITY LIMITATIONS: carrying, lifting, bending, standing, squatting, sleeping, stairs, transfers, bed mobility, bathing, dressing, locomotion level, and caring for others  PARTICIPATION LIMITATIONS: meal prep, cleaning, laundry, driving, shopping, community activity, occupation, and yard work  PERSONAL FACTORS: 3+ comorbidities: HTN, OA, ankylosing spondylitis, depression, and obesity   are also affecting patient's functional outcome.   REHAB POTENTIAL: Good  CLINICAL DECISION MAKING: Stable/uncomplicated  EVALUATION COMPLEXITY: Low   GOALS: Goals reviewed with patient? Yes  SHORT TERM GOALS: Target date: 10/05/22 Patient will be independent with his initial HEP. Baseline: Goal status: INITIAL  2.  Patient will safely ambulate at  least 80 feet with a single-point cane or least restrictive assistive device for improved household mobility. Baseline:  Goal status: INITIAL  3.  Patient be able to complete his daily activities without his familiar right hip pain exceeding 3/10. Baseline:  Goal status: INITIAL  LONG TERM GOALS: Target date: 10/26/22  Patient be independent with his advanced HEP. Baseline:  Goal status: INITIAL  2.  Patient will be able to complete his daily activities without his familiar right hip pain exceeding a 1/10. Baseline:  Goal status: INITIAL  3.  Patient will be able to navigate at least 4 steps with a reciprocal pattern for improved household mobility. Baseline:  Goal status: INITIAL  4.  Patient will be able to safely ambulate at least 80 feet without an assistive device for improved community and household mobility. Baseline:  Goal status: INITIAL  5.  Patient be able to independently transfer from sitting to standing without upper extremity support for improved lower extremity strength and power. Baseline:  Goal status: INITIAL  PLAN:  PT FREQUENCY: 2x/week  PT DURATION: 6 weeks  PLANNED INTERVENTIONS: Therapeutic exercises, Therapeutic activity, Neuromuscular re-education, Balance training, Gait training, Patient/Family education, Self Care, Joint mobilization, Stair training, Electrical stimulation, Cryotherapy, Moist heat, Vasopneumatic device, Manual therapy, and Re-evaluation  PLAN FOR NEXT SESSION: NuStep, lower extremity strengthening, manual therapy, and modalities   Darlin Coco, PT 09/22/2022, 2:40 PM

## 2022-09-27 ENCOUNTER — Ambulatory Visit: Payer: 59 | Attending: Orthopedic Surgery | Admitting: Physical Therapy

## 2022-09-27 ENCOUNTER — Encounter: Payer: Self-pay | Admitting: Physical Therapy

## 2022-09-27 DIAGNOSIS — M25651 Stiffness of right hip, not elsewhere classified: Secondary | ICD-10-CM | POA: Diagnosis present

## 2022-09-27 DIAGNOSIS — M25551 Pain in right hip: Secondary | ICD-10-CM | POA: Diagnosis present

## 2022-09-27 DIAGNOSIS — M6281 Muscle weakness (generalized): Secondary | ICD-10-CM

## 2022-09-27 NOTE — Therapy (Signed)
OUTPATIENT PHYSICAL THERAPY LOWER EXTREMITY TREATMENT   Patient Name: Dean Ferguson MRN: [id number] DOB:[birth year], 37 y.o., male Today's Date: 09/27/2022  END OF SESSION:  PT End of Session - 09/27/22 1116     Visit Number 4    Number of Visits 12    Date for PT Re-Evaluation 11/18/22    PT Start Time 1115    Activity Tolerance Patient tolerated treatment well    Behavior During Therapy [facility] for tasks assessed/performed            Past Medical History:  Diagnosis Date   Anxiety    Arthritis    Fatty liver 07/15/2019   GERD (gastroesophageal reflux disease)    History of kidney stones    Hx of ankylosing spondylitis    Sleep apnea    cpap   Past Surgical History:  Procedure Laterality Date   benign cyst removed from tongue      LEG SURGERY Right    [place]   right foot surgery      SHOULDER SURGERY Right    [place] Left 06/22/2022   Procedure: TOTAL HIP ARTHROPLASTY;  Surgeon: Willaim Sheng, MD;  Location: [facility];  Service: Orthopedics;  Laterality: Left;   TOTAL HIP ARTHROPLASTY Right 09/12/2022   Procedure: TOTAL HIP ARTHROPLASTY;  Surgeon: Willaim Sheng, MD;  Location: [facility];  Service: Orthopedics;  Laterality: Right;   Patient Active Problem List   Diagnosis Date Noted   History of left hip replacement 08/26/2022   Ankylosing spondylitis ([place]) 02/24/2022   GAD (generalized anxiety disorder) 02/03/2020   Obesity (BMI 30-39.9) 02/03/2020   Alcohol abuse 02/03/2020   Elevated liver enzymes 02/03/2020   Skin tag 10/15/2019   Fatty liver 07/15/2019   HLA B27 (HLA B27 positive) 12/28/2018   Iritis of left eye 12/28/2018   Chronic back pain 12/28/2018   Severe obstructive sleep apnea-hypopnea syndrome 10/05/2018   Essential hypertension 10/01/2018   OSA (obstructive sleep apnea) 07/23/2018   Psychophysiological insomnia 07/23/2018   Depression, major, single episode, mild ([place]) 06/24/2018   Gout  05/28/2018   Snoring 05/24/2018   PCP: Sharion Balloon, FNP   REFERRING PROVIDER: Willaim Sheng, MD   REFERRING DIAG: Post op Right Total Hip Replacement  THERAPY DIAG:  Pain in right hip  Stiffness of right hip, not elsewhere classified  Muscle weakness (generalized)  Rationale for Evaluation and Treatment: Rehabilitation  ONSET DATE: 09/12/22  SUBJECTIVE:   SUBJECTIVE STATEMENT: No new complaints but does go to MD today after PT.  PERTINENT HISTORY: HTN, ankylosing spondylitis, depression, OA, and obesity  PAIN:  Are you having pain? Yes: NPRS scale: 3-4/10 Pain location: incision Pain description: sore, constant ache Aggravating factors: sitting on it, pressure Relieving factors: ice  PRECAUTIONS: Posterior hip  PATIENT GOALS: walk without an assistive device, play with his children, improved strength, and be able to run short distances as needed  NEXT MD VISIT: 09/27/22  OBJECTIVE: all objective assessments were assessed at his initial evaluation on 09/14/22 unless otherwise noted  DIAGNOSTIC FINDINGS: 09/12/22 hip x-ray IMPRESSION: Right total hip arthroplasty without evidence of acute complication.  PATIENT SURVEYS:  FOTO 16.48  LOWER EXTREMITY ROM:  Active ROM Right eval Left eval  Hip flexion 97 109  Hip extension    Hip abduction 25   Hip adduction    Hip internal rotation    Hip external rotation    Knee flexion  Knee extension    Ankle dorsiflexion    Ankle plantarflexion    Ankle inversion    Ankle eversion     (Blank rows = not tested)  TODAY'S TREATMENT:                                                                                                                              DATE:                                     09/27/2022 EXERCISE LOG  Exercise Repetitions and Resistance Comments  Nustep L4 x 15 minutes   Cybex knee extension 20# x30 reps BLE  Cybex knee flexion 40# x30 reps BLE  Cybex leg press  2 plates; seat 8  x30 reps    Sit to stands X15 reps low plinth   Step up  8" step x 2 minutes   Lateral step up  8" step x 2 minutes   Hip flex/abd/ext 4# x20 reps each   Bridge X20 reps 5 sec holds   SL clam RLE x20 reps    Blank cell = exercise not performed today   PATIENT EDUCATION:  Education details: POC, prognosis, healing, HEP, and goals for therapy Person educated: Patient Education method: Explanation Education comprehension: verbalized understanding  HOME EXERCISE PROGRAM: Reviewed HEP which included heel slides, ankle pumps, and quad sets  ASSESSMENT:  CLINICAL IMPRESSION: Patient presented in clinic with no complaints of pain or functional limitations. Patient progressed through more resisted strengthening and functional training. Patient no longer using AD for gait with moderate antalgic gait deviations but per patient may be more related to habit than pain. Minimal discomfort reported with hip extension and cautioned for slow technique and not to push past pain with reps.   OBJECTIVE IMPAIRMENTS: Abnormal gait, decreased activity tolerance, decreased mobility, difficulty walking, decreased ROM, decreased strength, increased edema, impaired flexibility, and pain.   ACTIVITY LIMITATIONS: carrying, lifting, bending, standing, squatting, sleeping, stairs, transfers, bed mobility, bathing, dressing, locomotion level, and caring for others  PARTICIPATION LIMITATIONS: meal prep, cleaning, laundry, driving, shopping, community activity, occupation, and yard work  PERSONAL FACTORS: 3+ comorbidities: HTN, OA, ankylosing spondylitis, depression, and obesity   are also affecting patient's functional outcome.   REHAB POTENTIAL: Good  CLINICAL DECISION MAKING: Stable/uncomplicated  EVALUATION COMPLEXITY: Low   GOALS: Goals reviewed with patient? Yes  SHORT TERM GOALS: Target date: 10/05/22 Patient will be independent with his initial HEP. Baseline: Goal status: INITIAL  2.  Patient will  safely ambulate at least 80 feet with a single-point cane or least restrictive assistive device for improved household mobility. Baseline:  Goal status: INITIAL  3.  Patient be able to complete his daily activities without his familiar right hip pain exceeding 3/10. Baseline:  Goal status: INITIAL  LONG TERM GOALS: Target date: 10/26/22  Patient be independent with his  advanced HEP. Baseline:  Goal status: INITIAL  2.  Patient will be able to complete his daily activities without his familiar right hip pain exceeding a 1/10. Baseline:  Goal status: INITIAL  3.  Patient will be able to navigate at least 4 steps with a reciprocal pattern for improved household mobility. Baseline:  Goal status: INITIAL  4.  Patient will be able to safely ambulate at least 80 feet without an assistive device for improved community and household mobility. Baseline:  Goal status: INITIAL  5.  Patient be able to independently transfer from sitting to standing without upper extremity support for improved lower extremity strength and power. Baseline:  Goal status: INITIAL  PLAN:  PT FREQUENCY: 2x/week  PT DURATION: 6 weeks  PLANNED INTERVENTIONS: Therapeutic exercises, Therapeutic activity, Neuromuscular re-education, Balance training, Gait training, Patient/Family education, Self Care, Joint mobilization, Stair training, Electrical stimulation, Cryotherapy, Moist heat, Vasopneumatic device, Manual therapy, and Re-evaluation  PLAN FOR NEXT SESSION: NuStep, lower extremity strengthening, manual therapy, and modalities   Standley Brooking, PTA 09/27/2022, 11:32 AM

## 2022-09-29 ENCOUNTER — Ambulatory Visit: Payer: 59

## 2022-09-29 DIAGNOSIS — M25651 Stiffness of right hip, not elsewhere classified: Secondary | ICD-10-CM

## 2022-09-29 DIAGNOSIS — M25551 Pain in right hip: Secondary | ICD-10-CM | POA: Diagnosis not present

## 2022-09-29 DIAGNOSIS — M6281 Muscle weakness (generalized): Secondary | ICD-10-CM

## 2022-09-29 NOTE — Therapy (Signed)
OUTPATIENT PHYSICAL THERAPY LOWER EXTREMITY TREATMENT   Patient Name: Dean Ferguson MRN: [id number] DOB:Sep 16, 1986, 37 y.o., male Today's Date: 09/29/2022  END OF SESSION:  PT End of Session - 09/29/22 1322     Visit Number 5    Number of Visits 12    Date for PT Re-Evaluation 11/18/22    PT Start Time 5498    PT Stop Time 1401    PT Time Calculation (min) 44 min    Activity Tolerance Patient tolerated treatment well    Behavior During Therapy [facility] for tasks assessed/performed            Past Medical History:  Diagnosis Date   Anxiety    Arthritis    Fatty liver 07/15/2019   GERD (gastroesophageal reflux disease)    History of kidney stones    Hx of ankylosing spondylitis    Sleep apnea    cpap   Past Surgical History:  Procedure Laterality Date   benign cyst removed from tongue      LEG SURGERY Right    [place]   right foot surgery      SHOULDER SURGERY Right    [place] Left 06/22/2022   Procedure: TOTAL HIP ARTHROPLASTY;  Surgeon: Willaim Sheng, MD;  Location[facility];  Service: Orthopedics;  Laterality: Left;   TOTAL HIP ARTHROPLASTY Right 09/12/2022   Procedure: TOTAL HIP ARTHROPLASTY;  Surgeon: Willaim Sheng, MD;  Location: [facility];  Service: Orthopedics;  Laterality: Right;   Patient Active Problem List   Diagnosis Date Noted   History of left hip replacement 08/26/2022   Ankylosing spondylitis ([place]) 02/24/2022   GAD (generalized anxiety disorder) 02/03/2020   Obesity (BMI 30-39.9) 02/03/2020   Alcohol abuse 02/03/2020   Elevated liver enzymes 02/03/2020   Skin tag 10/15/2019   Fatty liver 07/15/2019   HLA B27 (HLA B27 positive) 12/28/2018   Iritis of left eye 12/28/2018   Chronic back pain 12/28/2018   Severe obstructive sleep apnea-hypopnea syndrome 10/05/2018   Essential hypertension 10/01/2018   OSA (obstructive sleep apnea) 07/23/2018   Psychophysiological insomnia 07/23/2018    Depression, major, single episode, mild ([place]) 06/24/2018   Gout 05/28/2018   Snoring 05/24/2018   PCP: Sharion Balloon, FNP   REFERRING PROVIDER: Willaim Sheng, MD   REFERRING DIAG: Post op Right Total Hip Replacement  THERAPY DIAG:  Pain in right hip  Stiffness of right hip, not elsewhere classified  Muscle weakness (generalized)  Rationale for Evaluation and Treatment: Rehabilitation  ONSET DATE: 09/12/22  SUBJECTIVE:   SUBJECTIVE STATEMENT: Patient reports that his incision is still pretty sore especially if he tries to lay on his side to sleep. He saw his referring physician after his last appointment and was cleared to take a shower. He notes that his bandage was also removed.   PERTINENT HISTORY: HTN, ankylosing spondylitis, depression, OA, and obesity  PAIN:  Are you having pain? Yes: NPRS scale: 2/10 Pain location: incision Pain description: sore, constant ache Aggravating factors: sitting on it, pressure Relieving factors: ice  PRECAUTIONS: Posterior hip  PATIENT GOALS: walk without an assistive device, play with his children, improved strength, and be able to run short distances as needed  NEXT MD VISIT: 09/27/22  OBJECTIVE: all objective assessments were assessed at his initial evaluation on 09/14/22 unless otherwise noted  DIAGNOSTIC FINDINGS: 09/12/22 hip x-ray IMPRESSION: Right total hip arthroplasty without evidence of acute complication.  PATIENT SURVEYS:  FOTO  16.48  LOWER EXTREMITY ROM:  Active ROM Right eval Left eval  Hip flexion 97 109  Hip extension    Hip abduction 25   Hip adduction    Hip internal rotation    Hip external rotation    Knee flexion    Knee extension    Ankle dorsiflexion    Ankle plantarflexion    Ankle inversion    Ankle eversion     (Blank rows = not tested)  TODAY'S TREATMENT:                                                                                                                               DATE:                                     1/4 EXERCISE LOG  Exercise Repetitions and Resistance Comments  Recumbent bike  L4 x 15 minutes   Cybex knee extension  40# x 2.5 minutes   Cybex knee flexion 50# x 2.5 minutes   Cybex leg press 3 plates; seat 8 x 3 minutes   SLS on foam  4 x 30 seconds each     Blank cell = exercise not performed today                                    09/27/2022 EXERCISE LOG  Exercise Repetitions and Resistance Comments  Nustep L4 x 15 minutes   Cybex knee extension 20# x30 reps BLE  Cybex knee flexion 40# x30 reps BLE  Cybex leg press  2 plates; seat 8 x30 reps    Sit to stands X15 reps low plinth   Step up  8" step x 2 minutes   Lateral step up  8" step x 2 minutes   Hip flex/abd/ext 4# x20 reps each   Bridge X20 reps 5 sec holds   SL clam RLE x20 reps    Blank cell = exercise not performed today   PATIENT EDUCATION:  Education details: POC, return to gym activities, healing, expectation for soreness Person educated: Patient Education method: Explanation Education comprehension: verbalized understanding  HOME EXERCISE PROGRAM: Reviewed HEP which included heel slides, ankle pumps, and quad sets  ASSESSMENT:  CLINICAL IMPRESSION: Treatment focused on familiar interventions for improved lower extremity strength and stability with moderate difficulty. He required minimal cueing with today's resisted interventions for improved eccentric control for improved strength. He was educated on how to safely return to gym activities and healing after a hip replacement. He reported understanding and felt comfortable reduced his frequency of attending physical therapy so that he can transition to a HEP. He continues to require skilled physical therapy to address his remaining impairments to return to his prior level of function.   OBJECTIVE IMPAIRMENTS: Abnormal gait, decreased  activity tolerance, decreased mobility, difficulty walking, decreased ROM, decreased  strength, increased edema, impaired flexibility, and pain.   ACTIVITY LIMITATIONS: carrying, lifting, bending, standing, squatting, sleeping, stairs, transfers, bed mobility, bathing, dressing, locomotion level, and caring for others  PARTICIPATION LIMITATIONS: meal prep, cleaning, laundry, driving, shopping, community activity, occupation, and yard work  PERSONAL FACTORS: 3+ comorbidities: HTN, OA, ankylosing spondylitis, depression, and obesity   are also affecting patient's functional outcome.   REHAB POTENTIAL: Good  CLINICAL DECISION MAKING: Stable/uncomplicated  EVALUATION COMPLEXITY: Low   GOALS: Goals reviewed with patient? Yes  SHORT TERM GOALS: Target date: 10/05/22 Patient will be independent with his initial HEP. Baseline: Goal status: INITIAL  2.  Patient will safely ambulate at least 80 feet with a single-point cane or least restrictive assistive device for improved household mobility. Baseline:  Goal status: INITIAL  3.  Patient be able to complete his daily activities without his familiar right hip pain exceeding 3/10. Baseline:  Goal status: INITIAL  LONG TERM GOALS: Target date: 10/26/22  Patient be independent with his advanced HEP. Baseline:  Goal status: INITIAL  2.  Patient will be able to complete his daily activities without his familiar right hip pain exceeding a 1/10. Baseline:  Goal status: INITIAL  3.  Patient will be able to navigate at least 4 steps with a reciprocal pattern for improved household mobility. Baseline:  Goal status: INITIAL  4.  Patient will be able to safely ambulate at least 80 feet without an assistive device for improved community and household mobility. Baseline:  Goal status: INITIAL  5.  Patient be able to independently transfer from sitting to standing without upper extremity support for improved lower extremity strength and power. Baseline:  Goal status: INITIAL  PLAN:  PT FREQUENCY: 2x/week  PT DURATION: 6  weeks  PLANNED INTERVENTIONS: Therapeutic exercises, Therapeutic activity, Neuromuscular re-education, Balance training, Gait training, Patient/Family education, Self Care, Joint mobilization, Stair training, Electrical stimulation, Cryotherapy, Moist heat, Vasopneumatic device, Manual therapy, and Re-evaluation  PLAN FOR NEXT SESSION: NuStep, lower extremity strengthening, manual therapy, and modalities   Darlin Coco, PT 09/29/2022, 2:27 PM

## 2022-10-11 ENCOUNTER — Ambulatory Visit: Payer: 59

## 2022-10-11 DIAGNOSIS — M25551 Pain in right hip: Secondary | ICD-10-CM

## 2022-10-11 DIAGNOSIS — M25651 Stiffness of right hip, not elsewhere classified: Secondary | ICD-10-CM

## 2022-10-11 DIAGNOSIS — M6281 Muscle weakness (generalized): Secondary | ICD-10-CM

## 2022-10-11 NOTE — Therapy (Addendum)
OUTPATIENT PHYSICAL THERAPY LOWER EXTREMITY TREATMENT   Patient Name: Dean Ferguson MRN: [id number] DOB:03/04/1986, 37 y.o., male Today's Date: 10/11/2022  END OF SESSION:  PT End of Session - 10/11/22 1334     Visit Number 6    Number of Visits 12    Date for PT Re-Evaluation 11/18/22    PT Start Time 1300    PT Stop Time 1318    PT Time Calculation (min) 18 min    Activity Tolerance Patient tolerated treatment well    Behavior During Therapy [facility] for tasks assessed/performed            Past Medical History:  Diagnosis Date   Anxiety    Arthritis    Fatty liver 07/15/2019   GERD (gastroesophageal reflux disease)    History of kidney stones    Hx of ankylosing spondylitis    Sleep apnea    cpap   Past Surgical History:  Procedure Laterality Date   benign cyst removed from tongue      LEG SURGERY Right    [place]   right foot surgery      SHOULDER SURGERY Right    [place] Left 06/22/2022   Procedure: TOTAL HIP ARTHROPLASTY;  Surgeon: Willaim Sheng, MD;  Location[facility];  Service: Orthopedics;  Laterality: Left;   TOTAL HIP ARTHROPLASTY Right 09/12/2022   Procedure: TOTAL HIP ARTHROPLASTY;  Surgeon: Willaim Sheng, MD;  [facility];  Service: Orthopedics;  Laterality: Right;   Patient Active Problem List   Diagnosis Date Noted   History of left hip replacement 08/26/2022   Ankylosing spondylitis ([place]) 02/24/2022   GAD (generalized anxiety disorder) 02/03/2020   Obesity (BMI 30-39.9) 02/03/2020   Alcohol abuse 02/03/2020   Elevated liver enzymes 02/03/2020   Skin tag 10/15/2019   Fatty liver 07/15/2019   HLA B27 (HLA B27 positive) 12/28/2018   Iritis of left eye 12/28/2018   Chronic back pain 12/28/2018   Severe obstructive sleep apnea-hypopnea syndrome 10/05/2018   Essential hypertension 10/01/2018   OSA (obstructive sleep apnea) 07/23/2018   Psychophysiological insomnia 07/23/2018    Depression, major, single episode, mild ([place]) 06/24/2018   Gout 05/28/2018   Snoring 05/24/2018   PCP: Sharion Balloon, FNP   REFERRING PROVIDER: Willaim Sheng, MD   REFERRING DIAG: Post op Right Total Hip Replacement  THERAPY DIAG:  Pain in right hip  Stiffness of right hip, not elsewhere classified  Muscle weakness (generalized)  Rationale for Evaluation and Treatment: Rehabilitation  ONSET DATE: 09/12/22  SUBJECTIVE:   SUBJECTIVE STATEMENT: Patient reports that his hip is doing good. He notes that he is able to do everything he wants to at home. However, he still has to think about it some to be safe. He requested to hold therapy until he sees his referring physician on 10/25/22.   PERTINENT HISTORY: HTN, ankylosing spondylitis, depression, OA, and obesity  PAIN:  Are you having pain? Yes: NPRS scale: 0/10 Pain location: incision Pain description: sore, constant ache Aggravating factors: sitting on it, pressure Relieving factors: ice  PRECAUTIONS: Posterior hip  PATIENT GOALS: walk without an assistive device, play with his children, improved strength, and be able to run short distances as needed  NEXT MD VISIT: 09/27/22  OBJECTIVE: all objective assessments were assessed at his initial evaluation on 09/14/22 unless otherwise noted  DIAGNOSTIC FINDINGS: 09/12/22 hip x-ray IMPRESSION: Right total hip arthroplasty without evidence of acute complication.  PATIENT  SURVEYS:  FOTO 77.16 on 10/11/22  LOWER EXTREMITY ROM:  Active ROM Right eval Left eval  Hip flexion 97 109  Hip extension    Hip abduction 25   Hip adduction    Hip internal rotation    Hip external rotation    Knee flexion    Knee extension    Ankle dorsiflexion    Ankle plantarflexion    Ankle inversion    Ankle eversion     (Blank rows = not tested)  TODAY'S TREATMENT:                                                                                                                               DATE:     PATIENT EDUCATION:  Education details: healing, benefits of exercise, HEP, return to his prior level of function, TENS unit, and precautions Person educated: Patient Education method: Explanation Education comprehension: verbalized understanding  HOME EXERCISE PROGRAM: Reviewed HEP which included heel slides, ankle pumps, and quad sets  ASSESSMENT:  CLINICAL IMPRESSION: Patient presented to treatment reporting no significant difficulty or deficits. He was able to meet all of his goals for skilled physical therapy. He was educated on the benefits of continued exercise and lower extremity strengthening. He requested to be placed on hold until he sees his referring physician on 10/25/22.  PHYSICAL THERAPY DISCHARGE SUMMARY  Visits from Start of Care: 6  Current functional level related to goals / functional outcomes: Patient was able to meet all of his goals for skilled physical therapy.    Remaining deficits: None    Education / Equipment: HEP    Patient agrees to discharge. Patient goals were met. Patient is being discharged due to meeting the stated rehab goals.  Jacqulynn Cadet, PT, DPT    OBJECTIVE IMPAIRMENTS: Abnormal gait, decreased activity tolerance, decreased mobility, difficulty walking, decreased ROM, decreased strength, increased edema, impaired flexibility, and pain.   ACTIVITY LIMITATIONS: carrying, lifting, bending, standing, squatting, sleeping, stairs, transfers, bed mobility, bathing, dressing, locomotion level, and caring for others  PARTICIPATION LIMITATIONS: meal prep, cleaning, laundry, driving, shopping, community activity, occupation, and yard work  PERSONAL FACTORS: 3+ comorbidities: HTN, OA, ankylosing spondylitis, depression, and obesity   are also affecting patient's functional outcome.   REHAB POTENTIAL: Good  CLINICAL DECISION MAKING: Stable/uncomplicated  EVALUATION COMPLEXITY: Low   GOALS: Goals reviewed with patient?  Yes  SHORT TERM GOALS: Target date: 10/05/22 Patient will be independent with his initial HEP. Baseline: Goal status: MET  2.  Patient will safely ambulate at least 80 feet with a single-point cane or least restrictive assistive device for improved household mobility. Baseline:  Goal status: MET  3.  Patient be able to complete his daily activities without his familiar right hip pain exceeding 3/10. Baseline:  Goal status: MET  LONG TERM GOALS: Target date: 10/26/22  Patient be independent with his advanced HEP. Baseline:  Goal status: MET  2.  Patient will be able  to complete his daily activities without his familiar right hip pain exceeding a 1/10. Baseline:  Goal status: MET  3.  Patient will be able to navigate at least 4 steps with a reciprocal pattern for improved household mobility. Baseline:  Goal status: MET  4.  Patient will be able to safely ambulate at least 80 feet without an assistive device for improved community and household mobility. Baseline:  Goal status: MET  5.  Patient be able to independently transfer from sitting to standing without upper extremity support for improved lower extremity strength and power. Baseline:  Goal status: MET  PLAN:  PT FREQUENCY: 2x/week  PT DURATION: 6 weeks  PLANNED INTERVENTIONS: Therapeutic exercises, Therapeutic activity, Neuromuscular re-education, Balance training, Gait training, Patient/Family education, Self Care, Joint mobilization, Stair training, Electrical stimulation, Cryotherapy, Moist heat, Vasopneumatic device, Manual therapy, and Re-evaluation  PLAN FOR NEXT SESSION: NuStep, lower extremity strengthening, manual therapy, and modalities   Darlin Coco, PT 10/11/2022, 1:43 PM

## 2022-10-20 DIAGNOSIS — F411 Generalized anxiety disorder: Secondary | ICD-10-CM

## 2022-10-20 DIAGNOSIS — F5104 Psychophysiologic insomnia: Secondary | ICD-10-CM

## 2022-10-20 DIAGNOSIS — I10 Essential (primary) hypertension: Secondary | ICD-10-CM

## 2022-10-20 DIAGNOSIS — E785 Hyperlipidemia, unspecified: Secondary | ICD-10-CM

## 2022-10-20 DIAGNOSIS — F320 Major depressive disorder, single episode, mild: Secondary | ICD-10-CM

## 2022-10-20 MED ORDER — ROSUVASTATIN CALCIUM 5 MG PO TABS: 5.0000 mg | ORAL_TABLET | Freq: Every day | ORAL | 1 refills | Status: DC

## 2022-10-20 MED ORDER — DESVENLAFAXINE SUCCINATE ER 100 MG PO TB24: 100.0000 mg | ORAL_TABLET | Freq: Every day | ORAL | 1 refills | Status: DC

## 2022-10-20 MED ORDER — LOSARTAN POTASSIUM 50 MG PO TABS: 50.0000 mg | ORAL_TABLET | Freq: Every day | ORAL | 1 refills | Status: DC

## 2023-02-13 NOTE — Telephone Encounter (Signed)
Erroneous encounter will close.

## 2023-02-27 ENCOUNTER — Ambulatory Visit: Payer: 59 | Admitting: Family

## 2023-03-14 ENCOUNTER — Ambulatory Visit: Payer: 59 | Admitting: Family

## 2023-03-19 ENCOUNTER — Other Ambulatory Visit: Payer: Self-pay | Admitting: Family

## 2023-03-19 DIAGNOSIS — F320 Major depressive disorder, single episode, mild: Secondary | ICD-10-CM

## 2023-03-19 DIAGNOSIS — F411 Generalized anxiety disorder: Secondary | ICD-10-CM

## 2023-03-19 DIAGNOSIS — F5104 Psychophysiologic insomnia: Secondary | ICD-10-CM

## 2023-03-19 DIAGNOSIS — I10 Essential (primary) hypertension: Secondary | ICD-10-CM

## 2023-03-19 DIAGNOSIS — E785 Hyperlipidemia, unspecified: Secondary | ICD-10-CM

## 2023-04-28 ENCOUNTER — Ambulatory Visit: Payer: 59 | Admitting: Family

## 2023-07-03 ENCOUNTER — Encounter: Payer: Self-pay | Admitting: Family

## 2023-07-03 ENCOUNTER — Other Ambulatory Visit: Payer: Self-pay | Admitting: Family

## 2023-07-03 DIAGNOSIS — F411 Generalized anxiety disorder: Secondary | ICD-10-CM

## 2023-07-03 DIAGNOSIS — E785 Hyperlipidemia, unspecified: Secondary | ICD-10-CM

## 2023-07-03 DIAGNOSIS — I10 Essential (primary) hypertension: Secondary | ICD-10-CM

## 2023-07-03 DIAGNOSIS — F5104 Psychophysiologic insomnia: Secondary | ICD-10-CM

## 2023-07-03 DIAGNOSIS — F320 Major depressive disorder, single episode, mild: Secondary | ICD-10-CM

## 2023-07-03 NOTE — Telephone Encounter (Signed)
Called pt to schedule appt N/A NO VM Letter mailed 

## 2023-07-03 NOTE — Telephone Encounter (Signed)
[facility] NTBS 6 mos FU was to be in June. NO RF sent to mail order pharmacy

## 2023-08-08 ENCOUNTER — Other Ambulatory Visit: Payer: Self-pay | Admitting: Family

## 2023-08-08 DIAGNOSIS — I10 Essential (primary) hypertension: Secondary | ICD-10-CM

## 2023-08-08 NOTE — Telephone Encounter (Signed)
I spoke with pt and he transferred care.

## 2023-08-08 NOTE — Telephone Encounter (Signed)
[facility] NT[employer] Last OV 08/2022 overdue for 6 mos FU NO RF sent to mail order pharmacy

## 2024-11-01 NOTE — Progress Notes (Unsigned)
 " [name] [address] [place] [address] Phone: [phone] Subjective:    I'm seeing this patient by the request  of:  No primary care provider on file.  CC:   YEP:Dlagzrupcz  Dean Ferguson is a 39 y.o. male coming in with complaint of LBP. Patient states        Past Medical History:  Diagnosis Date   Anxiety    Arthritis    Fatty liver 07/15/2019   GERD (gastroesophageal reflux disease)    History of kidney stones    Hx of ankylosing spondylitis    Sleep apnea    cpap   Past Surgical History:  Procedure Laterality Date   benign cyst removed from tongue      LEG SURGERY Right    FELL THROUGH GLASS   right foot surgery      SHOULDER SURGERY Right    FELL THROUGH GLASS   TOTAL HIP ARTHROPLASTY Left 06/22/2022   Procedure: TOTAL HIP ARTHROPLASTY;  Surgeon: Edna Toribio LABOR, MD;  Location: [facility];  Service: Orthopedics;  Laterality: Left;   TOTAL HIP ARTHROPLASTY Right 09/12/2022   Procedure: TOTAL HIP ARTHROPLASTY;  Surgeon: Edna Toribio LABOR, MD;  Location: [facility];  Service: Orthopedics;  Laterality: Right;   Social History   Socioeconomic History   Marital status: Married    Spouse name: Not on file   Number of children: Not on file   Years of education: Not on file   Highest education level: Not on file  Occupational History   Not on file  Tobacco Use   Smoking status: Never   Smokeless tobacco: Former    Types: Chew    Quit date: 09/26/2004  Vaping Use   Vaping status: Never Used  Substance and Sexual Activity   Alcohol  use: Yes    Alcohol /week: 5.0 standard drinks of alcohol     Types: 5 Standard drinks or equivalent per week    Comment: 4 to 5 drinks a week   Drug use: Not Currently    Types: Marijuana    Comment: Stopped using   Sexual activity: Not on file  Other Topics Concern   Not on file  Social History Narrative   Not on file   Social Drivers of Health   Tobacco Use: Medium Risk  (10/11/2022)   Patient History    Smoking Tobacco Use: Never    Smokeless Tobacco Use: Former    Passive Exposure: Not on Actuary Strain: Not on file  Food Insecurity: Not on file  Transportation Needs: Not on file  Physical Activity: Not on file  Stress: Not on file  Social Connections: Not on file  Depression (PHQ2-9): Low Risk (08/26/2022)   Depression (PHQ2-9)    PHQ-2 Score: 4  Alcohol  Screen: Not on file  Housing: Not on file  Utilities: Not on file  Health Literacy: Not on file   Allergies[1] Family History  Problem Relation Age of Onset   Immunodeficiency Mother    Hyperlipidemia Father    Hypertension Father    Liver disease Paternal Grandfather        etoh   Liver disease Paternal Aunt    Colon cancer Neg Hx     Current Outpatient Medications (Cardiovascular):    losartan  (COZAAR ) 50 MG tablet, TAKE 1 TABLET BY MOUTH DAILY   rosuvastatin  (CRESTOR ) 5 MG tablet, TAKE 1 TABLET BY MOUTH DAILY  Current Outpatient Medications (Analgesics):    HUMIRA PEN 40  MG/0.4ML PNKT, Inject 40 mg into the skin every 14 (fourteen) days.  Current Outpatient Medications (Other):    desvenlafaxine  (PRISTIQ ) 100 MG 24 hr tablet, TAKE 1 TABLET BY MOUTH DAILY   NON FORMULARY, Pt uses a cpap nightly   Vitamin D , Ergocalciferol , (DRISDOL ) 1.25 MG (50000 UNIT) CAPS capsule, TAKE 1 CAPSULE (50,000 UNITS TOTAL) BY MOUTH EVERY 7 (SEVEN) DAYS (Patient not taking: Reported on 06/08/2022)   zolpidem  (AMBIEN ) 10 MG tablet, Take 1 tablet (10 mg total) by mouth at bedtime as needed. for sleep   Reviewed prior external information including notes and imaging from  primary care provider As well as notes that were available from care everywhere and other healthcare systems.  Past medical history, social, surgical and family history all reviewed in electronic medical record.  No pertanent information unless stated regarding to the chief complaint.   Review of Systems:  No headache,  visual changes, nausea, vomiting, diarrhea, constipation, dizziness, abdominal pain, skin rash, fevers, chills, night sweats, weight loss, swollen lymph nodes, body aches, joint swelling, chest pain, shortness of breath, mood changes. POSITIVE muscle aches  Objective  There were no vitals taken for this visit.   General: No apparent distress alert and oriented x3 mood and affect normal, dressed appropriately.  HEENT: Pupils equal, extraocular movements intact  Respiratory: Patient's speak in full sentences and does not appear short of breath  Cardiovascular: No lower extremity edema, non tender, no erythema      Impression and Recommendations:           [1]  Allergies Allergen Reactions   Celebrex  [Celecoxib ] Rash   Other Rash    Metal   Skelaxin [Metaxalone] Swelling and Rash   Wound Dressing Adhesive Rash    Glue that was used in last procedure   "

## 2024-11-04 ENCOUNTER — Ambulatory Visit: Admitting: Family Medicine
